# Patient Record
Sex: Female | Born: 1945 | Race: White | Hispanic: No | Smoking: Never smoker
Health system: Southern US, Community
[De-identification: ages and names within clinical notes are randomized; demographics above are authoritative.]

## PROBLEM LIST (undated history)

## (undated) DIAGNOSIS — L02219 Cutaneous abscess of trunk, unspecified: Secondary | ICD-10-CM

## (undated) DIAGNOSIS — J45909 Unspecified asthma, uncomplicated: Secondary | ICD-10-CM

## (undated) DIAGNOSIS — I10 Essential (primary) hypertension: Secondary | ICD-10-CM

## (undated) DIAGNOSIS — E119 Type 2 diabetes mellitus without complications: Secondary | ICD-10-CM

## (undated) DIAGNOSIS — E785 Hyperlipidemia, unspecified: Secondary | ICD-10-CM

## (undated) DIAGNOSIS — E669 Obesity, unspecified: Secondary | ICD-10-CM

## (undated) DIAGNOSIS — L03319 Cellulitis of trunk, unspecified: Secondary | ICD-10-CM

## (undated) HISTORY — DX: Type 2 diabetes mellitus without complications: E11.9

## (undated) HISTORY — DX: Hyperlipidemia, unspecified: E78.5

## (undated) HISTORY — DX: Obesity, unspecified: E66.9

## (undated) HISTORY — DX: Essential (primary) hypertension: I10

## (undated) HISTORY — DX: Cellulitis of trunk, unspecified: L03.319

## (undated) HISTORY — DX: Unspecified asthma, uncomplicated: J45.909

## (undated) HISTORY — DX: Cutaneous abscess of trunk, unspecified: L02.219

---
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
## 1983-11-23 HISTORY — PX: ABDOMINAL HYSTERECTOMY: SHX81

## 1987-11-23 HISTORY — PX: OTHER SURGICAL HISTORY: SHX169

## 2000-09-09 ENCOUNTER — Encounter: Payer: Self-pay | Admitting: Emergency Medicine

## 2000-09-09 ENCOUNTER — Emergency Department (HOSPITAL_COMMUNITY): Admission: EM | Admit: 2000-09-09 | Discharge: 2000-09-10 | Payer: Self-pay | Admitting: Emergency Medicine

## 2006-07-14 ENCOUNTER — Encounter: Admission: RE | Admit: 2006-07-14 | Discharge: 2006-07-14 | Payer: Self-pay | Admitting: Orthopedic Surgery

## 2006-09-16 ENCOUNTER — Inpatient Hospital Stay (HOSPITAL_COMMUNITY): Admission: RE | Admit: 2006-09-16 | Discharge: 2006-09-19 | Payer: Self-pay | Admitting: Orthopedic Surgery

## 2008-05-29 ENCOUNTER — Encounter: Payer: Self-pay | Admitting: Family Medicine

## 2008-09-27 ENCOUNTER — Emergency Department (HOSPITAL_BASED_OUTPATIENT_CLINIC_OR_DEPARTMENT_OTHER): Admission: EM | Admit: 2008-09-27 | Discharge: 2008-09-27 | Payer: Self-pay | Admitting: Emergency Medicine

## 2009-02-11 ENCOUNTER — Ambulatory Visit: Payer: Self-pay | Admitting: Family Medicine

## 2009-02-11 DIAGNOSIS — E1140 Type 2 diabetes mellitus with diabetic neuropathy, unspecified: Secondary | ICD-10-CM | POA: Insufficient documentation

## 2009-02-11 DIAGNOSIS — E119 Type 2 diabetes mellitus without complications: Secondary | ICD-10-CM

## 2009-02-11 DIAGNOSIS — E1165 Type 2 diabetes mellitus with hyperglycemia: Secondary | ICD-10-CM

## 2009-02-11 DIAGNOSIS — I10 Essential (primary) hypertension: Secondary | ICD-10-CM

## 2009-02-11 DIAGNOSIS — E669 Obesity, unspecified: Secondary | ICD-10-CM

## 2009-02-11 DIAGNOSIS — J45909 Unspecified asthma, uncomplicated: Secondary | ICD-10-CM

## 2009-02-11 DIAGNOSIS — E785 Hyperlipidemia, unspecified: Secondary | ICD-10-CM | POA: Insufficient documentation

## 2009-02-11 HISTORY — DX: Essential (primary) hypertension: I10

## 2009-02-11 HISTORY — DX: Unspecified asthma, uncomplicated: J45.909

## 2009-02-11 HISTORY — DX: Hyperlipidemia, unspecified: E78.5

## 2009-02-11 HISTORY — DX: Type 2 diabetes mellitus without complications: E11.9

## 2009-02-11 HISTORY — DX: Obesity, unspecified: E66.9

## 2009-02-12 LAB — CONVERTED CEMR LAB
ALT: 22 units/L (ref 0–35)
AST: 21 units/L (ref 0–37)
Bilirubin, Direct: 0.1 mg/dL (ref 0.0–0.3)
Chloride: 106 meq/L (ref 96–112)
Direct LDL: 120.6 mg/dL
GFR calc non Af Amer: 107.42 mL/min (ref >60)
Microalb Creat Ratio: 3.1 mg/g (ref 0.0–30.0)
Microalb, Ur: 0.2 mg/dL (ref 0.0–1.9)
Potassium: 3.7 meq/L (ref 3.5–5.1)
Sodium: 142 meq/L (ref 135–145)
Total Bilirubin: 0.7 mg/dL (ref 0.3–1.2)
Total CHOL/HDL Ratio: 8
VLDL: 157.4 mg/dL — ABNORMAL HIGH (ref 0.0–40.0)

## 2009-04-05 LAB — HM DIABETES EYE EXAM: HM Diabetic Eye Exam: NORMAL

## 2009-04-30 ENCOUNTER — Ambulatory Visit: Payer: Self-pay | Admitting: Family Medicine

## 2009-05-01 ENCOUNTER — Telehealth: Payer: Self-pay | Admitting: Family Medicine

## 2009-05-01 LAB — CONVERTED CEMR LAB
Cholesterol: 190 mg/dL (ref 0–200)
HDL: 34.8 mg/dL — ABNORMAL LOW (ref >39.00)
VLDL: 49.6 mg/dL — ABNORMAL HIGH (ref 0.0–40.0)

## 2009-12-29 ENCOUNTER — Ambulatory Visit: Payer: Self-pay | Admitting: Family Medicine

## 2009-12-29 DIAGNOSIS — L02219 Cutaneous abscess of trunk, unspecified: Secondary | ICD-10-CM

## 2009-12-29 DIAGNOSIS — L03319 Cellulitis of trunk, unspecified: Secondary | ICD-10-CM

## 2009-12-29 HISTORY — DX: Cutaneous abscess of trunk, unspecified: L02.219

## 2009-12-31 ENCOUNTER — Ambulatory Visit: Payer: Self-pay | Admitting: Family Medicine

## 2010-01-02 LAB — CONVERTED CEMR LAB
AST: 27 units/L (ref 0–37)
Albumin: 3.6 g/dL (ref 3.5–5.2)
Creatinine,U: 90.3 mg/dL
Direct LDL: 73.3 mg/dL
Hgb A1c MFr Bld: 8.3 % — ABNORMAL HIGH (ref 4.6–6.5)
Microalb Creat Ratio: 15.5 mg/g (ref 0.0–30.0)
Total Bilirubin: 0.6 mg/dL (ref 0.3–1.2)
Total CHOL/HDL Ratio: 6
Triglycerides: 794 mg/dL — ABNORMAL HIGH (ref 0.0–149.0)
VLDL: 158.8 mg/dL — ABNORMAL HIGH (ref 0.0–40.0)

## 2010-01-19 ENCOUNTER — Telehealth: Payer: Self-pay | Admitting: Family Medicine

## 2010-07-01 ENCOUNTER — Ambulatory Visit: Payer: Self-pay | Admitting: Family Medicine

## 2010-07-06 LAB — HM DIABETES FOOT EXAM

## 2010-07-09 LAB — CONVERTED CEMR LAB
ALT: 37 units/L — ABNORMAL HIGH (ref 0–35)
AST: 33 units/L (ref 0–37)
Alkaline Phosphatase: 49 units/L (ref 39–117)
Bilirubin, Direct: 0.1 mg/dL (ref 0.0–0.3)
Direct LDL: 80 mg/dL
HDL: 37.1 mg/dL — ABNORMAL LOW (ref >39.00)
Microalb, Ur: 2.7 mg/dL — ABNORMAL HIGH (ref 0.0–1.9)
Total Bilirubin: 0.5 mg/dL (ref 0.3–1.2)
Total Protein: 6.5 g/dL (ref 6.0–8.3)

## 2010-12-22 NOTE — Assessment & Plan Note (Signed)
Summary: MED CHECK/REFILLS/PT COMING IN FASTING/CJR   Vital Signs:  Patient profile:   65 year old female Weight:      234 pounds Temp:     98.3 degrees F oral Pulse rate:   78 / minute BP sitting:   120 / 80  (left arm) Cuff size:   large  Vitals Entered By: Romualdo Bolk, CMA (AAMA) (July 01, 2010 11:07 AM) CC: Pt is fasting and here for a follow up on her medication, Hypertension Management   History of Present Illness: Here for follow up diabetes and hypertension. Has lost some weight due to her efforts since last visit. Compliant with meds.  Diabetes Management History:      She has not been enrolled in the "Diabetic Education Program".  She states understanding of dietary principles but she is not following the appropriate diet.  No sensory loss is reported.  Self foot exams are being performed.  She is not checking home blood sugars.  She says that she is not exercising regularly.        Hypoglycemic symptoms are not occurring.  No hyperglycemic symptoms are reported.        The following changes have been made to her treatment plan since last visit: medication changes.    Hypertension History:      She denies headache, chest pain, palpitations, dyspnea with exertion, orthopnea, PND, peripheral edema, visual symptoms, neurologic problems, syncope, and side effects from treatment.  She notes no problems with any antihypertensive medication side effects.        Positive major cardiovascular risk factors include female age 44 years old or older, diabetes, hyperlipidemia, and hypertension.  Negative major cardiovascular risk factors include non-tobacco-user status.      Preventive Screening-Counseling & Management  Alcohol-Tobacco     Alcohol drinks/day: 0     Smoking Status: never  Caffeine-Diet-Exercise     Caffeine use/day: 2     Does Patient Exercise: no  Current Medications (verified): 1)  Metformin Hcl 500 Mg Tabs (Metformin Hcl) .... Two Tabs in Am, Two  Tabs in Pm 2)  Fish Oil  Oil (Fish Oil) .... Once Daily 3)  Fenofibrate 160 Mg Tabs (Fenofibrate) .... Once Daily 4)  Pravachol 40 Mg Tabs (Pravastatin Sodium) .... Once Daily  Allergies (verified): 1)  ! Codeine Sulfate (Codeine Sulfate) 2)  ! Lipitor (Atorvastatin Calcium)  Past History:  Past Medical History: Last updated: 02/11/2009 Asthma Hyperlipidemia Diabetes Hay Fever/Allergies Fx left shoulder (Dr Madelon Lips)  Past Surgical History: Last updated: 02/11/2009 Appendectomy 1957 Hysterectomy partial 1985  Family History: Last updated: 02/11/2009 Family History of Arthritis grandparent Family History of Cardiovascular disorder grandparent Diabetes, parent  Social History: Last updated: 02/11/2009 Retired Married Never Smoked Alcohol use-no  Risk Factors: Alcohol Use: 0 (07/01/2010) Caffeine Use: 2 (07/01/2010) Exercise: no (07/01/2010)  Risk Factors: Smoking Status: never (07/01/2010) PMH-FH-SH reviewed for relevance  Social History: Caffeine use/day:  2  Review of Systems  The patient denies anorexia, fever, chest pain, syncope, dyspnea on exertion, peripheral edema, prolonged cough, headaches, hemoptysis, abdominal pain, melena, hematochezia, muscle weakness, depression, and enlarged lymph nodes.    Physical Exam  General:  Well-developed,well-nourished,in no acute distress; alert,appropriate and cooperative throughout examination Head:  Normocephalic and atraumatic without obvious abnormalities. No apparent alopecia or balding. Eyes:  No corneal or conjunctival inflammation noted. EOMI. Perrla. Funduscopic exam benign, without hemorrhages, exudates or papilledema. Vision grossly normal. Mouth:  Oral mucosa and oropharynx without lesions or exudates.  Teeth in  good repair. Neck:  No deformities, masses, or tenderness noted. Lungs:  Normal respiratory effort, chest expands symmetrically. Lungs are clear to auscultation, no crackles or wheezes. Heart:   normal rate, regular rhythm, and no gallop.   Extremities:  No clubbing, cyanosis, edema, or deformity noted with normal full range of motion of all joints.    Diabetes Management Exam:    Foot Exam (with socks and/or shoes not present):       Sensory-Pinprick/Light touch:          Left medial foot (L-4): normal          Left dorsal foot (L-5): normal          Left lateral foot (S-1): normal          Right medial foot (L-4): normal          Right dorsal foot (L-5): normal          Right lateral foot (S-1): normal       Sensory-Monofilament:          Left foot: normal          Right foot: normal       Inspection:          Left foot: normal          Right foot: normal       Nails:          Left foot: normal          Right foot: normal    Eye Exam:       Eye Exam done elsewhere          Date: 03/22/2009          Results: normal          Done by: Dr. Joseph Art   Impression & Recommendations:  Problem # 1:  DIABETES-TYPE 2 (ICD-250.00) hx of poor control. Her updated medication list for this problem includes:    Metformin Hcl 500 Mg Tabs (Metformin hcl) .Marland Kitchen..Marland Kitchen Two tabs in am, two tabs in pm  Orders: Venipuncture (63875) Specimen Handling (64332) TLB-A1C / Hgb A1C (Glycohemoglobin) (83036-A1C) TLB-Microalbumin/Creat Ratio, Urine (82043-MALB)  Problem # 2:  HYPERLIPIDEMIA (ICD-272.4)  Her updated medication list for this problem includes:    Fenofibrate 160 Mg Tabs (Fenofibrate) ..... Once daily    Pravachol 40 Mg Tabs (Pravastatin sodium) ..... Once daily  Orders: Venipuncture (95188) Specimen Handling (41660) TLB-Lipid Panel (80061-LIPID) TLB-Hepatic/Liver Function Pnl (80076-HEPATIC)  Problem # 3:  OBESITY (ICD-278.00) needs to continue weight loss efforts.  Problem # 4:  ESSENTIAL HYPERTENSION (ICD-401.9)  Complete Medication List: 1)  Metformin Hcl 500 Mg Tabs (Metformin hcl) .... Two tabs in am, two tabs in pm 2)  Fish Oil Oil (Fish oil) .... Once daily 3)   Fenofibrate 160 Mg Tabs (Fenofibrate) .... Once daily 4)  Pravachol 40 Mg Tabs (Pravastatin sodium) .... Once daily  Hypertension Assessment/Plan:      The patient's hypertensive risk group is category C: Target organ damage and/or diabetes.  Today's blood pressure is 120/80.    Patient Instructions: 1)  Schedule eye exam. 2)  You need to lose weight. Consider a lower calorie diet and regular exercise.  3)  Check your blood sugars regularly. If your readings are usually above:  or below 70 you should contact our office.  4)  It is important that your diabetic A1c level is checked every 3 months.  5)  See your eye doctor yearly to check for  diabetic eye damage. 6)  Check your feet each night  for sore areas, calluses or signs of infection.  7)  Please schedule a follow-up appointment in 3 months .  Prescriptions: PRAVACHOL 40 MG TABS (PRAVASTATIN SODIUM) once daily  #90 x 3   Entered and Authorized by:   Evelena Peat MD   Signed by:   Evelena Peat MD on 07/01/2010   Method used:   Electronically to        [facility]* (retail)       [address] [place], [place]  [address]       Ph: [phone]       Fax: [phone]   RxID:   [id number] FENOFIBRATE 160 MG TABS (FENOFIBRATE) once daily  #90 x 3   Entered and Authorized by:   Evelena Peat MD   Signed by:   Evelena Peat MD on 07/01/2010   Method used:   Electronically to        [facility]* (retail)       [address] [place], [place]  [address]       Ph: [phone]       Fax: [phone]   RxID:   [id number] METFORMIN HCL 500 MG TABS (METFORMIN HCL) two tabs in AM, two tabs in PM  #360 x 3   Entered and Authorized by:   Evelena Peat MD   Signed by:   Evelena Peat MD on 07/01/2010   Method used:   Electronically to        [facility]* (retail)       [address] [place], [place]  [address]        Ph: [phone]       Fax: [phone]   RxID:   [id number]

## 2010-12-22 NOTE — Progress Notes (Signed)
Summary: REQ FOR MED, generic for Fenofibrate  Phone Note Call from Patient   Caller: Patient [phone] Reason for Call: Talk to Nurse, Talk to Doctor Summary of Call: Pt called to speak with nurse.... adv that she needs to have a RX for the generic for Fenofibrate sent to [facility]acy on [address].... Pt adv she doesn't have presription insurance and brand name is too expensive.  Pt can be reached at [phone] with any questions or concerns.  Initial call taken by: Debbra Riding,  January 19, 2010 11:04 AM  Follow-up for Phone Call        She should already be on generic (fenofibrate) per med list. Follow-up by: Evelena Peat MD,  January 19, 2010 2:47 PM  Additional Follow-up for Phone Call Additional follow up Details #1::        Pt informed on home VM, Rx sent to [facility] Additional Follow-up by: Sid Falcon LPN,  January 19, 2010 3:29 PM    Prescriptions: FENOFIBRATE 160 MG TABS (FENOFIBRATE) once daily  #90 x 3   Entered by:   Sid Falcon LPN   Authorized by:   Evelena Peat MD   Signed by:   Sid Falcon LPN on 59/56/3875   Method used:   Electronically to        [facility]* (retail)       [address] [place], [place]  [address]       Ph: [phone]       Fax: [phone]   RxID:   [id number]

## 2010-12-22 NOTE — Assessment & Plan Note (Signed)
Summary: 2 DAY ROV/NJR   Vital Signs:  Patient profile:   65 year old female Temp:     99.2 degrees F oral BP sitting:   120 / 78  (left arm) Cuff size:   large  Vitals Entered By: Sid Falcon LPN (December 31, 2009 9:44 AM) CC: 2 day follow-up abd abcess   History of Present Illness: Followup right lower abdominal abscess and type 2 diabetes. Also history of hyperlipidemia.  No pain from abdominal abscess which was drained 2 days ago. No fever or chills. Redness is resolving and less edema. Taking Keflex without side effects.  Patient taking pravastatin for hyperlipidemia but stopped fenofibrate on her own. Not due to side effect. Patient states she was told by pharmacist there may be interaction potential with Pravachol. History of hypercholesterolemia and hypertriglyceridemia. No history of CAD.  Diabetes Management History:      She has not been enrolled in the "Diabetic Education Program".  She states understanding of dietary principles but she is not following the appropriate diet.  No sensory loss is reported.  Self foot exams are being performed.  She is not checking home blood sugars.  She says that she is not exercising regularly.        Hypoglycemic symptoms are not occurring.  No hyperglycemic symptoms are reported.    Preventive Screening-Counseling & Management  Caffeine-Diet-Exercise     Does Patient Exercise: no  Allergies: 1)  ! Codeine Sulfate (Codeine Sulfate) 2)  ! Lipitor (Atorvastatin Calcium)  Past History:  Past Medical History: Last updated: 02/11/2009 Asthma Hyperlipidemia Diabetes Hay Fever/Allergies Fx left shoulder (Dr Madelon Lips)  Past Surgical History: Last updated: 02/11/2009 Appendectomy 1957 Hysterectomy partial 1985  Family History: Last updated: 02/11/2009 Family History of Arthritis grandparent Family History of Cardiovascular disorder grandparent Diabetes, parent  Social History: Last updated:  02/11/2009 Retired Married Never Smoked Alcohol use-no PMH-FH-SH reviewed for relevance  Social History: Does Patient Exercise:  no  Review of Systems  The patient denies fever, weight loss, chest pain, syncope, dyspnea on exertion, peripheral edema, headaches, abdominal pain, melena, and hematochezia.    Physical Exam  General:  Well-developed,well-nourished,in no acute distress; alert,appropriate and cooperative throughout examination Ears:  External ear exam shows no significant lesions or deformities.  Otoscopic examination reveals clear canals, tympanic membranes are intact bilaterally without bulging, retraction, inflammation or discharge. Hearing is grossly normal bilaterally. Mouth:  Oral mucosa and oropharynx without lesions or exudates.  Teeth in good repair. Neck:  No deformities, masses, or tenderness noted. Lungs:  Normal respiratory effort, chest expands symmetrically. Lungs are clear to auscultation, no crackles or wheezes. Heart:  normal rate, regular rhythm, and no gallop.   Abdomen:  right lower abdominal wound is examined. Less erythema and nontender. Packing is out. No purulent drainage. No fluctuance.  Diabetes Management Exam:    Foot Exam (with socks and/or shoes not present):       Sensory-Pinprick/Light touch:          Left medial foot (L-4): normal          Left dorsal foot (L-5): normal          Left lateral foot (S-1): normal          Right medial foot (L-4): normal          Right dorsal foot (L-5): normal          Right lateral foot (S-1): normal       Sensory-Monofilament:  Left foot: normal          Right foot: normal       Inspection:          Left foot: normal          Right foot: normal       Nails:          Left foot: normal          Right foot: normal    Eye Exam:       Eye Exam done elsewhere          Date: 04/22/2009          Results: normal          Done by: Dr Lucretia Roers   Impression & Recommendations:  Problem # 1:   ABDOMINAL ABSCESS (ICD-682.2) Assessment Improved finish out antibiotic and followup if this is not fully healing over the next week Her updated medication list for this problem includes:    Cephalexin 500 Mg Caps (Cephalexin) ..... One by mouth three times a day for 10 days  Problem # 2:  DIABETES-TYPE 2 (ICD-250.00) poor compliance. Her updated medication list for this problem includes:    Metformin Hcl 500 Mg Tabs (Metformin hcl) ..... One tab in am, one tab in pm  Orders: TLB-A1C / Hgb A1C (Glycohemoglobin) (83036-A1C) TLB-Microalbumin/Creat Ratio, Urine (82043-MALB)  Problem # 3:  HYPERLIPIDEMIA (ICD-272.4) reasses lipids.  Hx very high triglycerides in past. Her updated medication list for this problem includes:    Fenofibrate 160 Mg Tabs (Fenofibrate) ..... Once daily    Pravachol 40 Mg Tabs (Pravastatin sodium) ..... Once daily  Orders: TLB-Lipid Panel (80061-LIPID) TLB-Hepatic/Liver Function Pnl (80076-HEPATIC)  Complete Medication List: 1)  Metformin Hcl 500 Mg Tabs (Metformin hcl) .... One tab in am, one tab in pm 2)  Fish Oil Oil (Fish oil) .... Once daily 3)  Fenofibrate 160 Mg Tabs (Fenofibrate) .... Once daily 4)  Pravachol 40 Mg Tabs (Pravastatin sodium) .... Once daily 5)  Cephalexin 500 Mg Caps (Cephalexin) .... One by mouth three times a day for 10 days  Patient Instructions: 1)  It is important that you exercise reguarly at least 20 minutes 5 times a week. If you develop chest pain, have severe difficulty breathing, or feel very tired, stop exercising immediately and seek medical attention.  2)  You need to lose weight. Consider a lower calorie diet and regular exercise.  3)  Check your blood sugars regularly. If your readings are usually above:140 or below 70 you should contact our office.  4)  It is important that your diabetic A1c level is checked every 3 months.  5)  See your eye doctor yearly to check for diabetic eye damage. 6)  Check your feet each  night  for sore areas, calluses or signs of infection.

## 2010-12-22 NOTE — Assessment & Plan Note (Signed)
Summary: SORE ON STOMACH (INFECTED?) / DIABETIC CONCERNS W/ SAME // RS   Vital Signs:  Patient profile:   65 year old female Weight:      248 pounds BMI:     47.03 Temp:     99.2 degrees F oral BP sitting:   120 / 80  (left arm) Cuff size:   large  Vitals Entered By: Sid Falcon LPN (December 29, 2009 2:57 PM) CC: sore on right lower abd Is Patient Diabetic? Yes Did you bring your meter with you today? No   History of Present Illness: Patient is type II diabetic with history of noncompliance seen with one-day history redness and swelling right lower abdomen. She just noticed this in shower earlier today. No associated pain. No fever or chills. Blood sugars been relatively stable with fastings 125.  Allergies: 1)  ! Codeine Sulfate (Codeine Sulfate) 2)  ! Lipitor (Atorvastatin Calcium)  Past History:  Past Medical History: Last updated: 02/11/2009 Asthma Hyperlipidemia Diabetes Hay Fever/Allergies Fx left shoulder (Dr Madelon Lips) PMH-FH-SH reviewed for relevance  Review of Systems      See HPI  Physical Exam  General:  Well-developed,well-nourished,in no acute distress; alert,appropriate and cooperative throughout examination Abdomen:  right lower abdominal wall reveals area of erythema approximately 4 x 5 cm and near center she has area of swelling and fluctuance approximately 1/2 cm diameter. No visible purulent drainage   Impression & Recommendations:  Problem # 1:  ABDOMINAL ABSCESS (ICD-682.2)  recommended I&D after reviewing risks and benefits with patient patient consented.  Prepped skin with betadine.  Anesth with 1%plain xylo.  Linear incision 1cm over area of fluctuance with purulence released.  Probed abscess with hemostat and then packed wound cavity with iodiform gauze.  Outer dressing applied.  Reassess 2 days.  Her updated medication list for this problem includes:    Cephalexin 500 Mg Caps (Cephalexin) ..... One by mouth three times a day for 10  days  Orders: I&D Abscess, Complex (10061)  Complete Medication List: 1)  Metformin Hcl 500 Mg Tabs (Metformin hcl) .... One tab in am, one tab in pm 2)  Fish Oil Oil (Fish oil) .... Once daily 3)  Fenofibrate 160 Mg Tabs (Fenofibrate) .... Once daily 4)  Pravachol 40 Mg Tabs (Pravastatin sodium) .... Once daily 5)  Cephalexin 500 Mg Caps (Cephalexin) .... One by mouth three times a day for 10 days  Patient Instructions: 1)  Schedule followup appointment in 2 days to reassess 2)  Followup sooner if you develop any fever or worsening symptoms Prescriptions: CEPHALEXIN 500 MG CAPS (CEPHALEXIN) one by mouth three times a day for 10 days  #30 x 0   Entered by:   Sid Falcon LPN   Authorized by:   Evelena Peat MD   Signed by:   Sid Falcon LPN on 09/32/3557   Method used:   Electronically to        [facility]* (retail)       [address] [place], [place]  [address]       Ph: [phone]       Fax: [phone]   RxID:   [id number] CEPHALEXIN 500 MG CAPS (CEPHALEXIN) one by mouth three times a day for 10 days  #30 x 0   Entered and Authorized by:   Evelena Peat MD   Signed by:   Evelena Peat MD on 12/29/2009  Method used:   Electronically to        [facility]* (retail)       [address]       [place], [place]  [id number]       Ph: [phone]       Fax: [phone]   RxID:   [id number]

## 2011-04-09 NOTE — Discharge Summary (Signed)
NAME:  Mary Caldwell, Mary Caldwell NO.:  [id number]   MEDICAL RECORD NO.:  [id number]          PATIENT TYPE:  INP   LOCATION:  5004                         FACILITY:  [facility]   PHYSICIAN:  Dyke Brackett, M.D.    DATE OF BIRTH:  [birth year]   DATE OF ADMISSION:  09/16/2006  DATE OF DISCHARGE:  09/19/2006                               DISCHARGE SUMMARY   ADMISSION DIAGNOSES:  1. End-stage osteoarthritis, left hip.  2. Type 2 diabetes mellitus.  3. Hypercholesterolemia.  4. Obesity.   DISCHARGE DIAGNOSES:  1. End-stage osteoarthritis, left hip, status post left total hip      arthroplasty.  2. Probable preoperative urinary tract infection with Escherichia coli      sensitive to all antibiotics.  3. Acute blood loss anemia secondary to surgery.  4. Mild hypokalemia.  5. Type 2 diabetes mellitus.  6. Hypercholesterolemia.  7. Obesity.   SURGICAL PROCEDURES:  On September 16, 2006, Mary Caldwell underwent a left  total hip arthroplasty by Dr. Marcie Mowers assisted by Rexene Edison,  PA-C.  He had a DePuy ASR unifemoral implant to size 47 placed with the  AML large stature 160 mm length, 45 mm offset size 13.5 femoral stem.  A  DePuy ASR acetabular cup ,size 54 __________ HA coated and a DePuy ASR  taper sleeve adapter 12/14 taper +5 neck length.   COMPLICATIONS:  None.   CONSULTANT:  Physical therapy and occupational therapy consult September 17, 2006.   HISTORY OF PRESENT ILLNESS:  This 65 year old white female patient  presented to Mary Caldwell with a history of a severe left femur fracture  treated with an IM nail.  She has had significant pain in the left hip  and groin since that was removed and 1992.  She has had x-rays that  showed some severe asymmetric degenerative joint disease in that left  hip with some focal osteochondral lesion of the left femoral head and  has tried multiple conservative measures which have all failed.  Because  of that and her continued pain,  she is presenting for a left hip  replacement.   HOSPITAL COURSE:  Mary Caldwell tolerated her surgical procedure well  without immediate postoperative complications.  She was transferred to  5000.  On postop day #1, T max 99.8, vital signs were stable.  She did  have some problems with nausea.  PCA was discontinued. She was started  on p.o. pain medications and did better with that.  She started on  therapy per protocol.   On postop day #2, T-max was 101.6, heart rate 112, respirations 22.  Hemoglobin was 8 with hematocrit of 22.  She was subsequently transfused  with 1 unit of packed red blood cells.  The left hip incision was benign  with moderate serous drainage.  Leg was neurovascularly intact.  Due to  her preop C&S which grew out 100,000 colonies per milliliter of E-coli,  a repeat UA and C&S were ordered and were done that day.  Repeat UA was  negative nitrite and leukocyte esterase, 0-2 red cells,  negative  protein, trace hemoglobin.  She was continued on therapy per protocol.   On postop day #3, she is doing well.  She wishes to go home today.  Pain  is well-controlled with p.o. medications. Nausea has resolved.  She is  having bowel movements without difficulty and voiding without  difficulty.  Repeat C&S has not been finalized yet.  Potassium is a  little bit low at 3.4, and that will be supplemented with oral potassium  today.  It is felt she is ready for discharge home,  and she will be  discharged home later today.   DISCHARGE INSTRUCTIONS:  Diet:  She is to resume her regular pre-  hospitalization diabetic diet.   MEDICATIONS:  She may resume her pre-hospitalization medications except  no Vicodin while on the Percocet.  Home medications included  1. Avandia 4 mg p.o. before breakfast and supper.  2. Metformin 500 mg p.o. before breakfast and supper.  3. TriCor 145 mg 1 tablet p.o. nightly.  4. Crestor 10 mg p.o. q.a.m..  5. Vicodin 1-2 tablets p.o. q.4-6 h p.r.n. for  pain which she will not      use while she is on.  Percocet.  Additional medications at this time include:  1. Lovenox 40 mg subcutaneously 8 a.m. with a last dose November 9, 11      with no refill.  2. Percocet 5/325 mg 1-2 tablets p.o. q.4 h p.r.n. for pain, 60, with      no refill.  3. Skelaxin 800 mg 1 tablet p.o. q.6 h p.r.n. for spasms, 40, with no      refill.  4. Keflex 500 mg 1 tablet p.o. q.i.d. for 7 days, 28, with no refill.  5. Suggested she take one iron tablet twice a day for 1 month.   ACTIVITY:  She can be out of bed weightbearing as tolerated on the left  leg with use a walker.  She is to have home health PT per [facility].  Please see the blue total hip discharge sheet for further  activity instructions.   WOUND CARE:  She may shower after no drainage from the wound for 2 days.  Please see the blue total hip discharge sheet for further wound care  instructions.   FOLLOWUP:  She needs to follow up with Mary Caldwell in our office on  Monday, November 5, and needs to call [phone] for that appointment.   LABORATORY DATA:  X-ray taken of her left hip on October 26 after  surgery showed the left hip replacement in good position with femoral  component well directed into the acetabular cup and no complications  noted.   Hemoglobin and hematocrit ranged from 12.7 and 36.2, respectively, on  October 22 to 8.0 and 22, respectively, on October 28 to 9.2 and 25.4,  respectively, on October 29.  White count is ranged from 6.2 on October  22 to 8.2 on October 29.  Platelets have remained within normal limits.   Glucose ranged from 85 on October 22 to a high of 194 on October 29.  Calcium ranged from 9.8 on October 22, to 8.3 on October 28, to 8.4 on  October 29.   UA on October 22 was positive nitrate, trace leukocyte esterase, few epithelials, 0-2 white cells, no red cells, many bacteria and did grow  out greater than 100,000 colonies per milliliter of  E-coli which was  sensitive to ampicillin,  ceftriaxone, cephazolin, Cipro, gentamicin, levofloxacin,  nitrofurantoin, tobramycin and trimethoprim sulfa.  All other laboratory  studies were within normal limits.  The final UA and C&S done on October  28. UA results as dictated in the Hospital Course. CNS results were not  available at this time.      Legrand Pitts Duffy, Arnetha Courser, M.D.  Electronically Signed    KED/MEDQ  D:  09/19/2006  T:  09/19/2006  Job:  [id number]   cc:   Evelena Peat, M.D.

## 2011-04-09 NOTE — Op Note (Signed)
NAME:  Mary Caldwell, FRIEND NO.:  [id number]   MEDICAL RECORD NO.:  [id number]          PATIENT TYPE:  INP   LOCATION:  5004                         FACILITY:  [facility]   PHYSICIAN:  Dyke Brackett, M.D.    DATE OF BIRTH:  04/02/1946   DATE OF PROCEDURE:  09/16/2006  DATE OF DISCHARGE:                               OPERATIVE REPORT   PREOPERATIVE DIAGNOSIS:  1. Severe osteoarthritis, left hip.  2. Morbid obesity.   POSTOPERATIVE DIAGNOSIS:  Severe osteoarthritis, left hip.   OPERATION:  Left total hip replacement (DePuy ASR metal-on-metal implant  with 13.5 mm large stature stem +5 mm neck length, 47 mm ball with 54 mm  cup).   SURGEON:  Dyke Brackett, M.D.   ASSISTANT:  __________   ESTIMATED BLOOD LOSS:  Approximately 400.   DESCRIPTION OF PROCEDURE:  With total prep and drape, the patient had to  have a rather extensive exposure given her morbid obesity.  She is  probably approximately 2-3 standard deviations above her normal weight.  She is exposed to extended posterior approach to the hip with exposure  of the iliotibial band.  This was technically more difficult secondary  to the patient's size; however, the iliotibial band was split with  splitting up the gluteus maximus fascia and muscle.  Short external  rotators were split with the T-capsulotomy made in the hip.  A wing  retractor was placed into the acetabulum for exposure.  A sharp Kocher  was placed anteriorly and a Medtronic.  Acetabulum was  exposed.  Prior to this the femur was cut about one fingerbreadth above  the lesser trochanter with revision of cut being made to allow placement  of several different neck lengths during the trial.  Broaching was  accomplished up to a 13.5 mm standard sound without difficulty.  Acetabulum was exposed and reamed up to a 53 size acetabulum to accept a  54 cup.  The 52-cup bottomed out nicely as a trial, but a 54-cup did  not.  The ASR cup size  54 was next implanted with about 15-20 degrees of  anteversion and about 45 degrees of abduction, seated nicely, confirmed  on an intraoperative x-ray to be seated well with the broach in place,  which appeared to fill the canal well and the femur as well.  Final  prosthesis was inserted and then again a trial neck at the +5 mm neck  length deemed to be extremely stable with good restoration of the leg  lengths.  Prior to insertion of the final components, the wound was  copiously irrigated.  T-capsulotomy was closed with interrupted  Ethibond.  The fascia lata with a  running Ethibond, the deep subcu with multiple 0 Vicryl with a 2-0  Vicryl in the subcutaneous tissues, the skin with a stapling device,  Marcaine with epinephrine placed into the skin subcutaneous tissues.  Compressive dressing applied.  Taken to the recovery room in stable  condition.      Dyke Brackett, M.D.  Electronically Signed     WDC/MEDQ  D:  09/16/2006  T:  09/17/2006  Job:  [id number]

## 2011-04-09 NOTE — Op Note (Signed)
NAME:  Mary Caldwell, Mary Caldwell NO.:  [id number]   MEDICAL RECORD NO.:  [id number]          PATIENT TYPE:  INP   LOCATION:  5004                         FACILITY:  [facility]   PHYSICIAN:  Dyke Brackett, M.D.    DATE OF BIRTH:  03/05/1946   DATE OF PROCEDURE:  09/16/2006  DATE OF DISCHARGE:  09/19/2006                               OPERATIVE REPORT   PREOPERATIVE DIAGNOSIS:  Left hip osteoarthritis.   POSTOPERATIVE DIAGNOSIS:  Left hip osteoarthritis.   PROCEDURE PERFORMED:  Left total hip replacement (DePuy ASR metal on  metal components with 13.5 large stature AML porous-coated stem with +5  mm neck length and size 54 acetabulum with a size 47 head).   SURGEON:  Dyke Brackett, M.D.   ASSISTANT SURGEON:  Arlys John D. Petrarca, P.A.-C.   DESCRIPTION OF PROCEDURE:  Lateral positioning with posterior approach  to the hip made.  On the left, splinting of the iliotibial band was  done.  The knee was flexed, to take the tension off the sciatic nerve.  Short external rotators were split with a T capsulotomy made with  identification of the diseased femoral head, cutting about one  fingerbreadth above the lesser trochanter, followed by progressive  reaming and then rasping to accept the appropriate size stem.  The  acetabulum was exposed with retractors anteriorly and inferiorly.  Moderately severe degenerative change was encountered, progressively  reamed up to accept a 1 mm under ring on the acetabulum.  Trial  reduction was carried out.  Based on the patient's relatively young age  and size, it was elected to do a metal-on-metal prosthesis.  The ASR cup  was inserted without difficulty, confirmed to be in good position with  about 45 degrees of abduction, 15 degrees of anteversion.  Again, trial  reduction was carried out and it seemed the most appropriate leg length  for restoration was at a +5 mm neck length.  The femoral stem was next  inserted with a good press-fit obtained.   No complication was noted.  The hip was extremely stable and could not really be dislocated on the  table.  The capsule was reapproximated with Ethibond; likewise, the  iliotibial band which was closed with 2-0 Vicryl, and the skin with skin  clips.  Marcaine with epinephrine to the skin, a light compressive  sterile dressing and knee immobilizer applied, and the patient was taken  to the recovery room in stable condition.      Dyke Brackett, M.D.  Electronically Signed     WDC/MEDQ  D:  11/09/2006  T:  11/10/2006  Job:  [id number]

## 2011-10-07 ENCOUNTER — Encounter: Payer: Self-pay | Admitting: *Deleted

## 2011-10-07 ENCOUNTER — Encounter: Payer: Self-pay | Admitting: Family Medicine

## 2011-10-13 ENCOUNTER — Ambulatory Visit (INDEPENDENT_AMBULATORY_CARE_PROVIDER_SITE_OTHER): Payer: Medicare Other | Admitting: Family Medicine

## 2011-10-13 ENCOUNTER — Encounter: Payer: Self-pay | Admitting: Family Medicine

## 2011-10-13 VITALS — BP 120/70 | Temp 98.3°F | Ht 60.0 in | Wt 212.0 lb

## 2011-10-13 DIAGNOSIS — E785 Hyperlipidemia, unspecified: Secondary | ICD-10-CM

## 2011-10-13 DIAGNOSIS — E669 Obesity, unspecified: Secondary | ICD-10-CM

## 2011-10-13 DIAGNOSIS — I10 Essential (primary) hypertension: Secondary | ICD-10-CM

## 2011-10-13 DIAGNOSIS — Z23 Encounter for immunization: Secondary | ICD-10-CM

## 2011-10-13 DIAGNOSIS — E119 Type 2 diabetes mellitus without complications: Secondary | ICD-10-CM

## 2011-10-13 LAB — HEMOGLOBIN A1C: Hgb A1c MFr Bld: 10.4 % — ABNORMAL HIGH (ref 4.6–6.5)

## 2011-10-13 LAB — LIPID PANEL
Cholesterol: 279 mg/dL — ABNORMAL HIGH (ref 0–200)
HDL: 40.7 mg/dL (ref >39.00)
Triglycerides: 935 mg/dL — ABNORMAL HIGH (ref 0.0–149.0)

## 2011-10-13 LAB — LDL CHOLESTEROL, DIRECT: Direct LDL: 76.5 mg/dL

## 2011-10-13 LAB — HEPATIC FUNCTION PANEL
ALT: 45 U/L — ABNORMAL HIGH (ref 0–35)
AST: 43 U/L — ABNORMAL HIGH (ref 0–37)
Albumin: 4 g/dL (ref 3.5–5.2)
Total Bilirubin: 0.7 mg/dL (ref 0.3–1.2)
Total Protein: 7.2 g/dL (ref 6.0–8.3)

## 2011-10-13 MED ORDER — METFORMIN HCL 500 MG PO TABS: 500.0000 mg | ORAL_TABLET | Freq: Two times a day (BID) | ORAL | Status: DC

## 2011-10-13 MED ORDER — PRAVASTATIN SODIUM 40 MG PO TABS: 40.0000 mg | ORAL_TABLET | Freq: Every day | ORAL | Status: DC

## 2011-10-13 MED ORDER — FENOFIBRATE 160 MG PO TABS: 160.0000 mg | ORAL_TABLET | Freq: Every day | ORAL | Status: DC

## 2011-10-13 NOTE — Progress Notes (Signed)
  Subjective:    Patient ID: Mary Caldwell, female    DOB: 1946/07/23, 65 y.o.   MRN: [id number]  HPI  Medical followup. History of very poor compliance. She has lost some significant weight since last visit and hopes to lose more. She has history of severe dyslipidemia as well as type 2 diabetes and morbid obesity. Not monitoring blood sugars. Supposed to be on Amaryl but has not been taking for some reason.  She is compliant with metformin. She remains on pravastatin for hyperlipidemia. Previous intolerance to Lipitor. Also takes fenofibrate. No myalgias. No symptoms of hyperglycemia.  Past Medical History  Diagnosis Date  . DIABETES-TYPE 2 02/11/2009  . HYPERLIPIDEMIA 02/11/2009  . OBESITY 02/11/2009  . ESSENTIAL HYPERTENSION 02/11/2009  . ASTHMA 02/11/2009  . ABDOMINAL ABSCESS 12/29/2009   Past Surgical History  Procedure Date  . Appendectomy 1957  . Abdominal hysterectomy 1985    reports that she has never smoked. She does not have any smokeless tobacco history on file. Her alcohol and drug histories not on file. family history includes Arthritis in her other; Diabetes in her other; and Heart disease in her other. Allergies  Allergen Reactions  . Atorvastatin     Does not remember  . Codeine Sulfate     GI upset     Review of Systems  Constitutional: Negative for fatigue.  Eyes: Negative for visual disturbance.  Respiratory: Negative for cough, chest tightness, shortness of breath and wheezing.   Cardiovascular: Negative for chest pain, palpitations and leg swelling.  Neurological: Negative for dizziness, seizures, syncope, weakness, light-headedness and headaches.       Objective:   Physical Exam  Constitutional: She appears well-developed and well-nourished.  HENT:  Mouth/Throat: Oropharynx is clear and moist.  Neck: Neck supple. No thyromegaly present.  Cardiovascular: Normal rate, regular rhythm and normal heart sounds.   Pulmonary/Chest: Effort normal and breath  sounds normal. No respiratory distress. She has no wheezes. She has no rales.  Musculoskeletal: She exhibits no edema.       Feet reveal no skin lesions. Good distal foot pulses. Good capillary refill. No calluses. Normal sensation with monofilament testing   Lymphadenopathy:    She has no cervical adenopathy.  Psychiatric: She has a normal mood and affect. Her behavior is normal.          Assessment & Plan:  #1 type 2 diabetes. History of suboptimal control. Recheck A1c. Hopefully improved with weight loss. #2 dyslipidemia. Check lipid and hepatic panel. Consider change to Crestor or if lipids poorly controlled

## 2011-10-15 NOTE — Progress Notes (Signed)
Quick Note:  Left a message for pt to return call. ______ 

## 2011-10-20 ENCOUNTER — Telehealth: Payer: Self-pay

## 2011-10-20 DIAGNOSIS — E785 Hyperlipidemia, unspecified: Secondary | ICD-10-CM

## 2011-10-20 MED ORDER — GLIMEPIRIDE 4 MG PO TABS: 4.0000 mg | ORAL_TABLET | ORAL | Status: DC

## 2011-10-20 MED ORDER — ROSUVASTATIN CALCIUM 20 MG PO TABS: 20.0000 mg | ORAL_TABLET | Freq: Every day | ORAL | Status: DC

## 2011-10-20 NOTE — Telephone Encounter (Signed)
Pt aware of labs (see results note) , samples of crestor up front for pick up - per dr. Caryl Never - ok to repeat labs 1 wk before rov  meds and labs ordered

## 2011-10-20 NOTE — Progress Notes (Signed)
Quick Note:  Spoke with pt - informed of dr. burchette's instructions - new rx's sent to [facility] , keep appt 01/13/12 - do labs 1 week before (fasting). Samples of crestor up front for pick up. ______

## 2012-01-06 ENCOUNTER — Other Ambulatory Visit: Payer: Medicare Other

## 2012-01-13 ENCOUNTER — Ambulatory Visit: Payer: Medicare Other | Admitting: Family Medicine

## 2012-06-13 ENCOUNTER — Encounter: Payer: Self-pay | Admitting: Family Medicine

## 2012-06-13 ENCOUNTER — Ambulatory Visit (INDEPENDENT_AMBULATORY_CARE_PROVIDER_SITE_OTHER): Payer: Medicare Other | Admitting: Family Medicine

## 2012-06-13 VITALS — BP 136/72 | Temp 99.0°F | Wt 219.0 lb

## 2012-06-13 DIAGNOSIS — I10 Essential (primary) hypertension: Secondary | ICD-10-CM

## 2012-06-13 DIAGNOSIS — E785 Hyperlipidemia, unspecified: Secondary | ICD-10-CM

## 2012-06-13 DIAGNOSIS — E669 Obesity, unspecified: Secondary | ICD-10-CM

## 2012-06-13 DIAGNOSIS — E119 Type 2 diabetes mellitus without complications: Secondary | ICD-10-CM

## 2012-06-13 LAB — HEPATIC FUNCTION PANEL
ALT: 49 U/L — ABNORMAL HIGH (ref 0–35)
Alkaline Phosphatase: 44 U/L (ref 39–117)
Bilirubin, Direct: 0.1 mg/dL (ref 0.0–0.3)
Total Bilirubin: 0.8 mg/dL (ref 0.3–1.2)
Total Protein: 7.2 g/dL (ref 6.0–8.3)

## 2012-06-13 LAB — LIPID PANEL: HDL: 41.9 mg/dL (ref >39.00)

## 2012-06-13 LAB — LDL CHOLESTEROL, DIRECT: Direct LDL: 47.7 mg/dL

## 2012-06-13 LAB — MICROALBUMIN / CREATININE URINE RATIO
Creatinine,U: 121.6 mg/dL
Microalb Creat Ratio: 4.9 mg/g (ref 0.0–30.0)

## 2012-06-13 MED ORDER — GLIMEPIRIDE 4 MG PO TABS: 4.0000 mg | ORAL_TABLET | ORAL | Status: DC

## 2012-06-13 MED ORDER — METFORMIN HCL 500 MG PO TABS: 500.0000 mg | ORAL_TABLET | Freq: Two times a day (BID) | ORAL | Status: DC

## 2012-06-13 MED ORDER — ROSUVASTATIN CALCIUM 20 MG PO TABS: 20.0000 mg | ORAL_TABLET | Freq: Every day | ORAL | Status: DC

## 2012-06-13 MED ORDER — FENOFIBRATE 160 MG PO TABS: 160.0000 mg | ORAL_TABLET | Freq: Every day | ORAL | Status: DC

## 2012-06-13 NOTE — Patient Instructions (Addendum)
Set up eye exam soon Lose some weight!  This will help your glucose, blood pressure, and lipid control.

## 2012-06-13 NOTE — Progress Notes (Signed)
  Subjective:    Patient ID: Mary Caldwell, female    DOB: 1945/12/30, 66 y.o.   MRN: [id number]  HPI  Medical followup. History of very poor compliance. Obesity, type 2 diabetes, dyslipidemia, marginal blood pressure. She's actually gained 7 more pounds since last visit. No regular exercise. Inconsistently checking blood sugars. We initiated glimepiride last visit but she ran out about a week or 2 ago. Blood pressures had been improving since starting taking that. Last A1c 10.4%. No recent eye exam. No neuropathy symptoms. Has normal kidney function. She has very severe hyperlipidemia and we changed from pravastatin to Crestor. She states she's been compliant with taking that. No chest pains. No blurred vision.  Past Medical History  Diagnosis Date  . DIABETES-TYPE 2 02/11/2009  . HYPERLIPIDEMIA 02/11/2009  . OBESITY 02/11/2009  . ESSENTIAL HYPERTENSION 02/11/2009  . ASTHMA 02/11/2009  . ABDOMINAL ABSCESS 12/29/2009   Past Surgical History  Procedure Date  . Appendectomy 1957  . Abdominal hysterectomy 1985    reports that she has never smoked. She does not have any smokeless tobacco history on file. Her alcohol and drug histories not on file. family history includes Arthritis in her other; Diabetes in her other; and Heart disease in her other. Allergies  Allergen Reactions  . Atorvastatin     Does not remember  . Codeine Sulfate     GI upset      Review of Systems  Constitutional: Negative for fatigue.  Eyes: Negative for visual disturbance.  Respiratory: Negative for cough, chest tightness, shortness of breath and wheezing.   Cardiovascular: Negative for chest pain, palpitations and leg swelling.  Neurological: Negative for dizziness, seizures, syncope, weakness, light-headedness and headaches.       Objective:   Physical Exam  Constitutional: She is oriented to person, place, and time.       Morbidly obese pleasant in no distress  HENT:  Mouth/Throat: Oropharynx is clear and  moist.  Neck: Neck supple. No thyromegaly present.  Cardiovascular: Normal rate and regular rhythm.   Pulmonary/Chest: Breath sounds normal. No respiratory distress. She has no wheezes. She has no rales.  Musculoskeletal:       Feet reveal no skin lesions. Good distal foot pulses. Good capillary refill. No calluses. Normal sensation with monofilament testing   Neurological: She is alert and oriented to person, place, and time.          Assessment & Plan:  #1 type 2 diabetes. History of poor control. Recheck A1c. Medications refilled. Check urine microalbumin. Set up eye exam.  #2 borderline elevated blood pressure. We suggested ACE inhibitor. Patient very reluctant. She prefers to work on weight loss first. Check urine microalbumin and if positive would start ACE inhibitor. Recommended ACE inhibitor now and pt wishes to try weight loss and in 3 months if blood pressure  still > 130/80 initiate ACE inhibitor  #3 hyperlipidemia. Recheck lipid and hepatic panel

## 2012-06-14 NOTE — Progress Notes (Signed)
Quick Note:  Pt informed, copy mailed to pt home ______ 

## 2012-06-17 ENCOUNTER — Other Ambulatory Visit: Payer: Self-pay | Admitting: Family Medicine

## 2012-09-11 ENCOUNTER — Ambulatory Visit (INDEPENDENT_AMBULATORY_CARE_PROVIDER_SITE_OTHER): Payer: Medicare Other | Admitting: Family Medicine

## 2012-09-11 ENCOUNTER — Encounter: Payer: Self-pay | Admitting: Family Medicine

## 2012-09-11 VITALS — BP 120/70 | Temp 98.2°F | Wt 215.0 lb

## 2012-09-11 DIAGNOSIS — E119 Type 2 diabetes mellitus without complications: Secondary | ICD-10-CM

## 2012-09-11 DIAGNOSIS — R197 Diarrhea, unspecified: Secondary | ICD-10-CM

## 2012-09-11 DIAGNOSIS — R300 Dysuria: Secondary | ICD-10-CM

## 2012-09-11 LAB — POCT URINALYSIS DIPSTICK
Bilirubin, UA: NEGATIVE
Ketones, UA: NEGATIVE
Leukocytes, UA: NEGATIVE
Nitrite, UA: NEGATIVE
Protein, UA: NEGATIVE
pH, UA: 6

## 2012-09-11 NOTE — Progress Notes (Signed)
  Subjective:    Patient ID: Mary Caldwell, female    DOB: [birth year], 66 y.o.   MRN: [id number]  HPI  Patient seen as acute work in with the following issues  Onset last Wednesday of some urine urgency and burning. Slightly better past couple days. No fever. No chills. Patient also relates intermittent diarrhea off and on for couple weeks. Mostly loose stools. No bloody diarrhea. No localizing abdominal pain. Denies any fever. No nausea or vomiting. No sick contacts.  She has history of obesity, dyslipidemia, type 2 diabetes, and hypertension. History of very poor compliance. Diabetes been poorly controlled. She does take metformin is not clear if she's taking this regularly. She has never had diarrhea previously with metformin. She's not had previous colonoscopy and we brought this up for discussion today and she's not willing at this time to consider. No recent weight changes. No appetite changes.  Past Medical History  Diagnosis Date  . DIABETES-TYPE 2 02/11/2009  . HYPERLIPIDEMIA 02/11/2009  . OBESITY 02/11/2009  . ESSENTIAL HYPERTENSION 02/11/2009  . ASTHMA 02/11/2009  . ABDOMINAL ABSCESS 12/29/2009   Past Surgical History  Procedure Date  . Appendectomy 1957  . Abdominal hysterectomy 1985    reports that she has never smoked. She does not have any smokeless tobacco history on file. Her alcohol and drug histories not on file. family history includes Arthritis in her other; Diabetes in her other; and Heart disease in her other. Allergies  Allergen Reactions  . Atorvastatin     Does not remember  . Codeine Sulfate     GI upset      Review of Systems  Constitutional: Negative for fever and chills.  Respiratory: Negative for shortness of breath.   Cardiovascular: Negative for chest pain.  Gastrointestinal: Positive for diarrhea. Negative for nausea, vomiting, constipation and blood in stool.  Genitourinary: Positive for dysuria.  Neurological: Negative for dizziness.         Objective:   Physical Exam  Constitutional: She appears well-developed and well-nourished.  HENT:  Mouth/Throat: Oropharynx is clear and moist.  Neck: Neck supple. No thyromegaly present.  Cardiovascular: Normal rate and regular rhythm.   Pulmonary/Chest: Effort normal and breath sounds normal. No respiratory distress. She has no wheezes. She has no rales.  Abdominal: Soft. Bowel sounds are normal. She exhibits no distension and no mass. There is no tenderness. There is no rebound and no guarding.  Musculoskeletal: She exhibits no edema.          Assessment & Plan:  #1 dysuria. Urine dipstick is unremarkable. No evidence for UTI. Continue adequate hydration #2 intermittent diarrhea. Possibly related to metformin but she's been on this for quite some time without prior symptoms. Symptoms are actually somewhat improved today. We discussed the fact that she needs colonoscopy (no prior screening) but she's not willing to consider at this point. #3 type 2 diabetes. Poorly controlled. Patient is rescheduled for diabetes followup. Consider addition of insulin if A1c not improving. She has been very reluctant to consider insulin in past.  Hx of very poor compliance.

## 2012-09-11 NOTE — Patient Instructions (Addendum)
Follow up promptly for any fever, increased abdominal pain, or any persistent diarrhea.

## 2012-09-13 ENCOUNTER — Ambulatory Visit: Payer: Medicare Other | Admitting: Family Medicine

## 2012-10-12 ENCOUNTER — Ambulatory Visit: Payer: Medicare Other | Admitting: Family Medicine

## 2013-07-27 ENCOUNTER — Other Ambulatory Visit: Payer: Self-pay | Admitting: Family Medicine

## 2013-09-07 ENCOUNTER — Ambulatory Visit (INDEPENDENT_AMBULATORY_CARE_PROVIDER_SITE_OTHER): Payer: Medicare Other | Admitting: Family Medicine

## 2013-09-07 ENCOUNTER — Encounter: Payer: Self-pay | Admitting: Family Medicine

## 2013-09-07 VITALS — BP 126/70 | HR 104 | Temp 98.3°F | Wt 203.0 lb

## 2013-09-07 DIAGNOSIS — E785 Hyperlipidemia, unspecified: Secondary | ICD-10-CM

## 2013-09-07 DIAGNOSIS — E119 Type 2 diabetes mellitus without complications: Secondary | ICD-10-CM

## 2013-09-07 DIAGNOSIS — I10 Essential (primary) hypertension: Secondary | ICD-10-CM

## 2013-09-07 DIAGNOSIS — E669 Obesity, unspecified: Secondary | ICD-10-CM | POA: Insufficient documentation

## 2013-09-07 DIAGNOSIS — Z23 Encounter for immunization: Secondary | ICD-10-CM

## 2013-09-07 LAB — BASIC METABOLIC PANEL
BUN: 14 mg/dL (ref 6–23)
Calcium: 10.2 mg/dL (ref 8.4–10.5)
GFR: 94.87 mL/min (ref >60.00)
Glucose, Bld: 322 mg/dL — ABNORMAL HIGH (ref 70–99)

## 2013-09-07 LAB — LIPID PANEL: Cholesterol: 364 mg/dL — ABNORMAL HIGH (ref 0–200)

## 2013-09-07 LAB — HEPATIC FUNCTION PANEL
ALT: 36 U/L — ABNORMAL HIGH (ref 0–35)
AST: 38 U/L — ABNORMAL HIGH (ref 0–37)
Albumin: 4 g/dL (ref 3.5–5.2)
Alkaline Phosphatase: 49 U/L (ref 39–117)
Bilirubin, Direct: 0 mg/dL (ref 0.0–0.3)
Total Protein: 7.2 g/dL (ref 6.0–8.3)

## 2013-09-07 LAB — HM DIABETES EYE EXAM

## 2013-09-07 NOTE — Progress Notes (Signed)
  Subjective:    Patient ID: Mary Caldwell, female    DOB: July 31, 1946, 67 y.o.   MRN: [id number]  HPI Medical followup. History of very poor compliance. She has history of obesity, type 2 diabetes, hypertension, hyperlipidemia She has not been seen in almost a year. She states that she is still taking aspirin, Crestor, fenofibrate, metformin, and glimepiride. She does not monitor her blood sugars. Denies any polydipsia or polyuria. She has lost about 12 pounds last visit here. She states she has made some dietary changes. No history of confirmed Pneumovax. Also needs flu vaccine.  Denies any recent chest pains. No dyspnea. No recent falls.  Past Medical History  Diagnosis Date  . DIABETES-TYPE 2 02/11/2009  . HYPERLIPIDEMIA 02/11/2009  . OBESITY 02/11/2009  . ESSENTIAL HYPERTENSION 02/11/2009  . ASTHMA 02/11/2009  . ABDOMINAL ABSCESS 12/29/2009   Past Surgical History  Procedure Laterality Date  . Appendectomy  1957  . Abdominal hysterectomy  1985    reports that she has never smoked. She does not have any smokeless tobacco history on file. Her alcohol and drug histories are not on file. family history includes Arthritis in her other; Diabetes in her other; Heart disease in her other. Allergies  Allergen Reactions  . Atorvastatin     Does not remember  . Codeine Sulfate     GI upset      Review of Systems  Constitutional: Positive for fatigue. Negative for fever, activity change, appetite change and unexpected weight change.  HENT: Negative for hearing loss, sore throat and trouble swallowing.   Respiratory: Negative for cough, shortness of breath and wheezing.   Cardiovascular: Negative for chest pain, palpitations and leg swelling.  Gastrointestinal: Negative for nausea, vomiting, abdominal pain, blood in stool and abdominal distention.  Endocrine: Negative for polydipsia and polyuria.  Genitourinary: Negative for dysuria and hematuria.  Musculoskeletal: Negative for gait  problem and myalgias.  Skin: Negative for rash.  Neurological: Negative for dizziness, syncope, weakness and headaches.  Hematological: Negative for adenopathy.  Psychiatric/Behavioral: Negative for confusion and dysphoric mood.       Objective:   Physical Exam  Constitutional: She appears well-developed and well-nourished.  HENT:  Right Ear: External ear normal.  Left Ear: External ear normal.  Mouth/Throat: Oropharynx is clear and moist.  Neck: Neck supple. No thyromegaly present.  Cardiovascular: Normal rate and regular rhythm.   Pulmonary/Chest: Effort normal and breath sounds normal. No respiratory distress. She has no wheezes. She has no rales.  Musculoskeletal: She exhibits no edema.  Neurological: She is alert.  Skin: No rash noted.          Assessment & Plan:  #1 type 2 diabetes. History of poor control. History of poor compliance. Set up eye exam. Recheck A1c. May need to add insulin soon Pt needs to lose more weight and we discussed. #2 dyslipidemia. Repeat lipids and hepatic panel #3 hypertension stable at goal #4 health maintenance. Flu vaccine and Pneumovax given.

## 2013-09-07 NOTE — Patient Instructions (Signed)
Continue with weight loss Set up eye exam

## 2013-09-10 ENCOUNTER — Other Ambulatory Visit: Payer: Self-pay | Admitting: Family Medicine

## 2013-09-17 MED ORDER — OMEGA-3-ACID ETHYL ESTERS 1 G PO CAPS: ORAL_CAPSULE | ORAL | Status: DC

## 2013-09-17 NOTE — Addendum Note (Signed)
Addended by: Thomasena Edis on: 09/17/2013 01:27 PM   Modules accepted: Orders

## 2013-12-03 ENCOUNTER — Ambulatory Visit: Payer: Medicare Other | Admitting: Family Medicine

## 2013-12-17 ENCOUNTER — Ambulatory Visit (INDEPENDENT_AMBULATORY_CARE_PROVIDER_SITE_OTHER): Payer: Medicare HMO | Admitting: Family Medicine

## 2013-12-17 ENCOUNTER — Encounter: Payer: Self-pay | Admitting: Family Medicine

## 2013-12-17 VITALS — BP 128/70 | HR 100 | Temp 98.3°F | Wt 205.0 lb

## 2013-12-17 DIAGNOSIS — I10 Essential (primary) hypertension: Secondary | ICD-10-CM

## 2013-12-17 DIAGNOSIS — E119 Type 2 diabetes mellitus without complications: Secondary | ICD-10-CM

## 2013-12-17 DIAGNOSIS — E785 Hyperlipidemia, unspecified: Secondary | ICD-10-CM

## 2013-12-17 MED ORDER — CANAGLIFLOZIN 100 MG PO TABS: 1.0000 | ORAL_TABLET | Freq: Every day | ORAL | Status: DC

## 2013-12-17 NOTE — Progress Notes (Signed)
Pre visit review using our clinic review tool, if applicable. No additional management support is needed unless otherwise documented below in the visit note. 

## 2013-12-17 NOTE — Progress Notes (Signed)
   Subjective:    Patient ID: Mary Caldwell, female    DOB: [birth year], 68 y.o.   MRN: [id number]  HPI Patient seen for medical followup. Has history of very poor compliance. She states she developed some depression issues since last visit and was overeating. Her sugars have not been well controlled. Last A1c over 11%. She has refused insulin this point. She remains on metformin and glimepiride. Her lipids were extremely elevated with triglycerides over 2000. We had prescribed lovaza 1 g but she never got this filled.  She denies any chest pains. No specific complaints today. No dizziness. She states she is compliant with her Crestor, metformin, and glimepiride. She's not had recent eye exam. She does not have any significant retinopathy or neuropathy history.  Past Medical History  Diagnosis Date  . DIABETES-TYPE 2 02/11/2009  . HYPERLIPIDEMIA 02/11/2009  . OBESITY 02/11/2009  . ESSENTIAL HYPERTENSION 02/11/2009  . ASTHMA 02/11/2009  . ABDOMINAL ABSCESS 12/29/2009   Past Surgical History  Procedure Laterality Date  . Appendectomy  1957  . Abdominal hysterectomy  1985    reports that she has never smoked. She does not have any smokeless tobacco history on file. Her alcohol and drug histories are not on file. family history includes Arthritis in her other; Diabetes in her other; Heart disease in her other. Allergies  Allergen Reactions  . Atorvastatin     Does not remember  . Codeine Sulfate     GI upset      Review of Systems  Constitutional: Negative for fatigue.  Eyes: Negative for visual disturbance.  Respiratory: Negative for cough, chest tightness, shortness of breath and wheezing.   Cardiovascular: Negative for chest pain, palpitations and leg swelling.  Neurological: Negative for dizziness, seizures, syncope, weakness, light-headedness and headaches.       Objective:   Physical Exam  Constitutional: She appears well-developed and well-nourished.  Cardiovascular: Normal  rate and regular rhythm.   Pulmonary/Chest: Effort normal and breath sounds normal. No respiratory distress. She has no wheezes. She has no rales.  Musculoskeletal: She exhibits no edema.  Skin:  Feet reveal no skin lesions. Good distal foot pulses. Good capillary refill. No calluses. Normal sensation with monofilament testing           Assessment & Plan:  #1 type 2 diabetes. Very poor control. History of very poor compliance. We strongly recommended weight loss and discussed strategies. Add Invokana 100 mg once daily with samples provided. We elected not to check A1c today since we know her sugars are poorly controlled and she's not any changes since last visit #2 dyslipidemia with extremely high cholesterol and triglycerides. Continue Crestor. Continue fenofibrate. She strongly recommended start lovaza. Recheck lipids at followup.

## 2013-12-17 NOTE — Patient Instructions (Signed)
You need to lose some weight. Scale back sugar and starch intake. Set up eye appointment.

## 2013-12-19 ENCOUNTER — Telehealth: Payer: Self-pay

## 2013-12-19 NOTE — Telephone Encounter (Signed)
Relevant patient education mailed to patient.  

## 2013-12-26 ENCOUNTER — Telehealth: Payer: Self-pay | Admitting: Family Medicine

## 2013-12-26 NOTE — Telephone Encounter (Signed)
Relevant patient education mailed to patient.  

## 2014-06-12 ENCOUNTER — Telehealth: Payer: Self-pay | Admitting: *Deleted

## 2014-06-12 DIAGNOSIS — E119 Type 2 diabetes mellitus without complications: Secondary | ICD-10-CM

## 2014-06-12 NOTE — Telephone Encounter (Signed)
Spoke with patient and she will call back to schedule an appointment to follow up with her diabetes. A1c, bmet, micro albumin ordered Diabetic bundle

## 2014-09-25 ENCOUNTER — Other Ambulatory Visit: Payer: Self-pay | Admitting: Family Medicine

## 2014-10-01 ENCOUNTER — Ambulatory Visit (INDEPENDENT_AMBULATORY_CARE_PROVIDER_SITE_OTHER): Payer: Medicare HMO | Admitting: Family Medicine

## 2014-10-01 ENCOUNTER — Ambulatory Visit (INDEPENDENT_AMBULATORY_CARE_PROVIDER_SITE_OTHER): Payer: Medicare HMO

## 2014-10-01 ENCOUNTER — Encounter: Payer: Self-pay | Admitting: Family Medicine

## 2014-10-01 VITALS — BP 130/80 | HR 67 | Temp 98.2°F | Wt 200.0 lb

## 2014-10-01 DIAGNOSIS — E1165 Type 2 diabetes mellitus with hyperglycemia: Secondary | ICD-10-CM

## 2014-10-01 DIAGNOSIS — Z23 Encounter for immunization: Secondary | ICD-10-CM

## 2014-10-01 DIAGNOSIS — E1140 Type 2 diabetes mellitus with diabetic neuropathy, unspecified: Secondary | ICD-10-CM

## 2014-10-01 DIAGNOSIS — IMO0002 Reserved for concepts with insufficient information to code with codable children: Secondary | ICD-10-CM

## 2014-10-01 DIAGNOSIS — E785 Hyperlipidemia, unspecified: Secondary | ICD-10-CM

## 2014-10-01 DIAGNOSIS — I10 Essential (primary) hypertension: Secondary | ICD-10-CM

## 2014-10-01 LAB — BASIC METABOLIC PANEL
BUN: 15 mg/dL (ref 6–23)
CO2: 27 mEq/L (ref 19–32)
CREATININE: 0.6 mg/dL (ref 0.4–1.2)
Calcium: 9.7 mg/dL (ref 8.4–10.5)
Chloride: 100 mEq/L (ref 96–112)
GFR: 101.64 mL/min (ref >60.00)
Glucose, Bld: 261 mg/dL — ABNORMAL HIGH (ref 70–99)
Potassium: 4.2 mEq/L (ref 3.5–5.1)
Sodium: 136 mEq/L (ref 135–145)

## 2014-10-01 LAB — LIPID PANEL
CHOLESTEROL: 213 mg/dL — AB (ref 0–200)
HDL: 25.1 mg/dL — ABNORMAL LOW (ref >39.00)
NonHDL: 187.9
TRIGLYCERIDES: 901 mg/dL — AB (ref 0.0–149.0)
Total CHOL/HDL Ratio: 8
VLDL: 180.2 mg/dL — ABNORMAL HIGH (ref 0.0–40.0)

## 2014-10-01 LAB — HEPATIC FUNCTION PANEL
ALK PHOS: 41 U/L (ref 39–117)
ALT: 29 U/L (ref 0–35)
AST: 29 U/L (ref 0–37)
Albumin: 3.4 g/dL — ABNORMAL LOW (ref 3.5–5.2)
BILIRUBIN DIRECT: 0.1 mg/dL (ref 0.0–0.3)
TOTAL PROTEIN: 7 g/dL (ref 6.0–8.3)
Total Bilirubin: 0.5 mg/dL (ref 0.2–1.2)

## 2014-10-01 LAB — MICROALBUMIN / CREATININE URINE RATIO
Creatinine,U: 43.1 mg/dL
Microalb Creat Ratio: 0.5 mg/g (ref 0.0–30.0)
Microalb, Ur: 0.2 mg/dL (ref 0.0–1.9)

## 2014-10-01 LAB — LDL CHOLESTEROL, DIRECT: Direct LDL: 47.2 mg/dL

## 2014-10-01 LAB — HEMOGLOBIN A1C: Hgb A1c MFr Bld: 10 % — ABNORMAL HIGH (ref 4.6–6.5)

## 2014-10-01 NOTE — Patient Instructions (Signed)
Start back baby aspirin 81 mg once daily Start long-acting insulin pain units once daily and titrate up 2 units every 3 days for fasting blood sugars over 130 Once fasting blood sugars are around 130 or less, stop titration of insulin at that point Continue all of your usual oral medications that you're currently on

## 2014-10-01 NOTE — Progress Notes (Signed)
   Subjective:    Patient ID: Mary Caldwell, female    DOB: [birth year], 68 y.o.   MRN: [id number]  HPI Patient here for medical follow-up. Long history of very poor compliance. Morbid obesity, type 2 diabetes, hypertension, severe dyslipidemia. She quit taking Invokana because of lack of coverage. She remains on glimepiride, metformin, Crestor, fenofibrate. She was prescribed Lovaza but never started this last year because of lack of coverage. Last A1c 11.1%. Fasting blood sugars consistently over 200. She is also not taking aspirin once daily. Not checking blood sugars regularly. Very poor compliance with diet. Denies any recent chest pains or abdominal pain.  She's not had eye exam in over a year.  Past Medical History  Diagnosis Date  . DIABETES-TYPE 2 02/11/2009  . HYPERLIPIDEMIA 02/11/2009  . OBESITY 02/11/2009  . ESSENTIAL HYPERTENSION 02/11/2009  . ASTHMA 02/11/2009  . ABDOMINAL ABSCESS 12/29/2009   Past Surgical History  Procedure Laterality Date  . Appendectomy  1957  . Abdominal hysterectomy  1985    reports that she has never smoked. She does not have any smokeless tobacco history on file. Her alcohol and drug histories are not on file. family history includes Arthritis in her other; Diabetes in her other; Heart disease in her other. Allergies  Allergen Reactions  . Atorvastatin     Does not remember  . Codeine Sulfate     GI upset      Review of Systems  Constitutional: Positive for fatigue. Negative for appetite change and unexpected weight change.  Eyes: Negative for visual disturbance.  Respiratory: Negative for cough, chest tightness, shortness of breath and wheezing.   Cardiovascular: Negative for chest pain, palpitations and leg swelling.  Gastrointestinal: Negative for abdominal pain.  Genitourinary: Negative for dysuria.  Neurological: Negative for dizziness, seizures, syncope, weakness, light-headedness and headaches.       Objective:   Physical Exam    Constitutional: She is oriented to person, place, and time. She appears well-developed and well-nourished. No distress.  Neck: Neck supple. No thyromegaly present.  Cardiovascular: Normal rate and regular rhythm.   Pulmonary/Chest: Effort normal and breath sounds normal. No respiratory distress. She has no wheezes. She has no rales.  Musculoskeletal: She exhibits no edema.  Neurological: She is alert and oriented to person, place, and time.          Assessment & Plan:  #1 type 2 diabetes. Poorly controlled. She has normal monofilament testing but burning sensation suggesting some early neuropathy. Very poor compliance history. Recheck A1c. Start long-acting insulin 10 units once daily and titration regimen will be given and reassess in one month.  Needs eye exam and also suggest getting back on baby aspirin one daily #2 severe dyslipidemia. Recheck lipid panel. She's had severely elevated cholesterol and triglycerides in the past. Triglycerides be difficult to manage and lesser blood sugars improved #3 morbid obesity. We explained without weight loss will be very difficult to manage her diabetes or lipids. We've offered nutritional counseling

## 2014-10-01 NOTE — Progress Notes (Signed)
Pre visit review using our clinic review tool, if applicable. No additional management support is needed unless otherwise documented below in the visit note. 

## 2014-10-02 ENCOUNTER — Telehealth: Payer: Self-pay | Admitting: Family Medicine

## 2014-10-02 MED ORDER — GLIMEPIRIDE 4 MG PO TABS: ORAL_TABLET | ORAL | Status: DC

## 2014-10-02 MED ORDER — METFORMIN HCL 500 MG PO TABS: ORAL_TABLET | ORAL | Status: DC

## 2014-10-02 MED ORDER — FENOFIBRATE 160 MG PO TABS: ORAL_TABLET | ORAL | Status: DC

## 2014-10-02 MED ORDER — ROSUVASTATIN CALCIUM 20 MG PO TABS: ORAL_TABLET | ORAL | Status: DC

## 2014-10-02 MED ORDER — INSULIN GLARGINE 300 UNIT/ML ~~LOC~~ SOPN: 10.0000 [IU] | PEN_INJECTOR | Freq: Every day | SUBCUTANEOUS | Status: DC

## 2014-10-02 NOTE — Telephone Encounter (Signed)
Rx sent to pharmacy   

## 2014-10-02 NOTE — Telephone Encounter (Signed)
Pt request refill of the following: metFORMIN (GLUCOPHAGE) 500 MG tablet, Insulin Glargine (TOUJEO SOLOSTAR) 300 UNIT/ML SOPN, fenofibrate 160 MG tablet, glimepiride (AMARYL) 4 MG tablet  CRESTOR 20 MG tablet     Phamacy:  [facility] [address] [facility]

## 2014-10-31 ENCOUNTER — Ambulatory Visit (INDEPENDENT_AMBULATORY_CARE_PROVIDER_SITE_OTHER): Payer: Medicare HMO | Admitting: Family Medicine

## 2014-10-31 ENCOUNTER — Encounter: Payer: Self-pay | Admitting: Family Medicine

## 2014-10-31 VITALS — BP 130/78 | HR 77 | Temp 97.9°F | Wt 200.0 lb

## 2014-10-31 DIAGNOSIS — IMO0002 Reserved for concepts with insufficient information to code with codable children: Secondary | ICD-10-CM

## 2014-10-31 DIAGNOSIS — E1140 Type 2 diabetes mellitus with diabetic neuropathy, unspecified: Secondary | ICD-10-CM

## 2014-10-31 DIAGNOSIS — E1165 Type 2 diabetes mellitus with hyperglycemia: Secondary | ICD-10-CM

## 2014-10-31 NOTE — Progress Notes (Signed)
   Subjective:    Patient ID: Mary Caldwell, female    DOB: October 21, 1946, 68 y.o.   MRN: [id number]  HPI Follow-up type 2 diabetes. She has history of severe dyslipidemia as well. We started long-acting insulin injections last visit starting at 10 units once daily. She is titrating up 2 units every 3 days is currently at 28 units. Her fasting blood sugars are still around 200 but her overall these are somewhat improved. She has history of hypertriglyceridemia. Recent LDL 47. No hypoglycemia. Still needs to set up eye exam  Past Medical History  Diagnosis Date  . DIABETES-TYPE 2 02/11/2009  . HYPERLIPIDEMIA 02/11/2009  . OBESITY 02/11/2009  . ESSENTIAL HYPERTENSION 02/11/2009  . ASTHMA 02/11/2009  . ABDOMINAL ABSCESS 12/29/2009   Past Surgical History  Procedure Laterality Date  . Appendectomy  1957  . Abdominal hysterectomy  1985    reports that she has never smoked. She does not have any smokeless tobacco history on file. Her alcohol and drug histories are not on file. family history includes Arthritis in her other; Diabetes in her other; Heart disease in her other. Allergies  Allergen Reactions  . Atorvastatin     Does not remember  . Codeine Sulfate     GI upset      Review of Systems  Constitutional: Negative for fatigue.  Eyes: Negative for visual disturbance.  Respiratory: Negative for cough, chest tightness, shortness of breath and wheezing.   Cardiovascular: Negative for chest pain, palpitations and leg swelling.  Endocrine: Negative for polydipsia and polyuria.  Neurological: Negative for dizziness, seizures, syncope, weakness, light-headedness and headaches.       Objective:   Physical Exam  Constitutional: She appears well-developed and well-nourished.  Neck: Neck supple. No thyromegaly present.  Cardiovascular: Normal rate and regular rhythm.   Pulmonary/Chest: Effort normal and breath sounds normal. No respiratory distress. She has no wheezes. She has no rales.    Musculoskeletal: She exhibits no edema.          Assessment & Plan:  Type 2 diabetes. History of poor control. Needs to lose some weight. Continue titration for long-acting insulin. We discussed other options such as addition of SGT 2 inhibitor but cost may be an issue. Would be somewhat leery of trying GLP-1 class with increased risk of pancreatitis with very high triglycerides. We'll plan A1c at follow-up

## 2014-10-31 NOTE — Progress Notes (Signed)
Pre visit review using our clinic review tool, if applicable. No additional management support is needed unless otherwise documented below in the visit note. 

## 2014-10-31 NOTE — Patient Instructions (Signed)
Continue to titrate up your insulin 2 units every 3 days until fasting sugars around 130 or less.

## 2015-01-30 ENCOUNTER — Ambulatory Visit: Payer: Medicare HMO | Admitting: Family Medicine

## 2015-04-24 ENCOUNTER — Telehealth: Payer: Self-pay | Admitting: *Deleted

## 2015-04-24 NOTE — Telephone Encounter (Signed)
My chart message sent about mammogram.

## 2015-05-08 ENCOUNTER — Telehealth: Payer: Self-pay

## 2015-05-08 NOTE — Telephone Encounter (Signed)
Left message for patient to return call.

## 2015-05-08 NOTE — Telephone Encounter (Signed)
-----   Message from Marian Sorrow, LPN sent at 1/56/1537  1:19 PM EDT ----- I sent a My Chart message to pt regarding Mammogram due. Pt has not read my message. Please call pt.   Thanks, Butch Penny

## 2015-05-19 ENCOUNTER — Other Ambulatory Visit: Payer: Self-pay

## 2015-11-13 ENCOUNTER — Other Ambulatory Visit: Payer: Self-pay | Admitting: Family Medicine

## 2015-11-25 ENCOUNTER — Telehealth: Payer: Self-pay | Admitting: Family Medicine

## 2015-11-25 MED ORDER — METFORMIN HCL 500 MG PO TABS: ORAL_TABLET | ORAL | Status: DC

## 2015-11-25 NOTE — Telephone Encounter (Signed)
Patient called and requested a refill on her Metformin. She has an appt sched for 12/02/14. She uses the [facility] on [address]. Pt states she will run out of meds in a couple of days. Please advise.

## 2015-11-25 NOTE — Telephone Encounter (Signed)
Medication sent into pharmacy  

## 2015-12-03 ENCOUNTER — Encounter: Payer: Self-pay | Admitting: Family Medicine

## 2015-12-03 ENCOUNTER — Ambulatory Visit (INDEPENDENT_AMBULATORY_CARE_PROVIDER_SITE_OTHER): Payer: Commercial Managed Care - HMO | Admitting: Family Medicine

## 2015-12-03 VITALS — BP 122/92 | HR 82 | Temp 98.2°F | Ht 60.0 in | Wt 192.8 lb

## 2015-12-03 DIAGNOSIS — E1140 Type 2 diabetes mellitus with diabetic neuropathy, unspecified: Secondary | ICD-10-CM | POA: Diagnosis not present

## 2015-12-03 DIAGNOSIS — E1165 Type 2 diabetes mellitus with hyperglycemia: Secondary | ICD-10-CM

## 2015-12-03 DIAGNOSIS — E669 Obesity, unspecified: Secondary | ICD-10-CM

## 2015-12-03 DIAGNOSIS — E785 Hyperlipidemia, unspecified: Secondary | ICD-10-CM

## 2015-12-03 DIAGNOSIS — IMO0002 Reserved for concepts with insufficient information to code with codable children: Secondary | ICD-10-CM

## 2015-12-03 DIAGNOSIS — Z23 Encounter for immunization: Secondary | ICD-10-CM

## 2015-12-03 DIAGNOSIS — I10 Essential (primary) hypertension: Secondary | ICD-10-CM

## 2015-12-03 LAB — BASIC METABOLIC PANEL
BUN: 12 mg/dL (ref 6–23)
CO2: 28 meq/L (ref 19–32)
CREATININE: 0.61 mg/dL (ref 0.40–1.20)
Calcium: 9.6 mg/dL (ref 8.4–10.5)
Chloride: 100 mEq/L (ref 96–112)
GFR: 103.21 mL/min (ref >60.00)
GLUCOSE: 186 mg/dL — AB (ref 70–99)
Potassium: 4 mEq/L (ref 3.5–5.1)
Sodium: 138 mEq/L (ref 135–145)

## 2015-12-03 LAB — MICROALBUMIN / CREATININE URINE RATIO
Creatinine,U: 67.9 mg/dL
MICROALB/CREAT RATIO: 1 mg/g (ref 0.0–30.0)
Microalb, Ur: <0.7 mg/dL (ref 0.0–1.9)

## 2015-12-03 LAB — LIPID PANEL
CHOLESTEROL: 167 mg/dL (ref 0–200)
HDL: 26.5 mg/dL — ABNORMAL LOW (ref >39.00)
Total CHOL/HDL Ratio: 6
Triglycerides: 701 mg/dL — ABNORMAL HIGH (ref 0.0–149.0)

## 2015-12-03 LAB — HEPATIC FUNCTION PANEL
ALK PHOS: 40 U/L (ref 39–117)
ALT: 22 U/L (ref 0–35)
AST: 20 U/L (ref 0–37)
Albumin: 4.1 g/dL (ref 3.5–5.2)
Bilirubin, Direct: 0 mg/dL (ref 0.0–0.3)
Total Bilirubin: 0.4 mg/dL (ref 0.2–1.2)
Total Protein: 6.7 g/dL (ref 6.0–8.3)

## 2015-12-03 LAB — HEMOGLOBIN A1C: Hgb A1c MFr Bld: 8.9 % — ABNORMAL HIGH (ref 4.6–6.5)

## 2015-12-03 LAB — LDL CHOLESTEROL, DIRECT: Direct LDL: 37 mg/dL

## 2015-12-03 NOTE — Addendum Note (Signed)
Addended by: Elio Forget on: 12/03/2015 09:27 AM   Modules accepted: Orders

## 2015-12-03 NOTE — Patient Instructions (Signed)
Set up eye exam Start back Toujeo at 10 units once daily and titrate up 2 units every 3 days until fasting sugars <130.

## 2015-12-03 NOTE — Progress Notes (Signed)
   Subjective:    Patient ID: Mary Caldwell, female    DOB: 1946-07-21, 70 y.o.   MRN: [id number]  HPI Medical follow-up. Patient has history of poor compliance has not been seen in over one year. She has history of obesity, dyslipidemia, hypertension, type 2 diabetes  Diabetes. Not monitoring blood sugars. She remains on metformin and Amaryl. She has not been taking her insulin recently. Denies any side effects. No polyuria or polydipsia She has not had eye exam in over a year but is in process of setting up  History of severe dyslipidemia. She states she is taking Crestor and fenofibrate regularly. No recent chest pains. No history of peripheral vascular disease or CAD  Reported history of hypertension but currently not taking any medication.  Past Medical History  Diagnosis Date  . DIABETES-TYPE 2 02/11/2009  . HYPERLIPIDEMIA 02/11/2009  . OBESITY 02/11/2009  . ESSENTIAL HYPERTENSION 02/11/2009  . ASTHMA 02/11/2009  . ABDOMINAL ABSCESS 12/29/2009   Past Surgical History  Procedure Laterality Date  . Appendectomy  1957  . Abdominal hysterectomy  1985    reports that she has never smoked. She does not have any smokeless tobacco history on file. Her alcohol and drug histories are not on file. family history includes Arthritis in her other; Diabetes in her other; Heart disease in her other. Allergies  Allergen Reactions  . Atorvastatin     Does not remember  . Codeine Sulfate     GI upset      Review of Systems  Constitutional: Negative for fatigue.  Eyes: Negative for visual disturbance.  Respiratory: Negative for cough, chest tightness, shortness of breath and wheezing.   Cardiovascular: Negative for chest pain, palpitations and leg swelling.  Endocrine: Negative for polydipsia and polyuria.  Genitourinary: Negative for dysuria.  Neurological: Negative for dizziness, seizures, syncope, weakness, light-headedness and headaches.       Objective:   Physical Exam    Constitutional: She appears well-developed and well-nourished.  Eyes: Pupils are equal, round, and reactive to light.  Neck: Neck supple. No JVD present. No thyromegaly present.  Cardiovascular: Normal rate and regular rhythm.  Exam reveals no gallop.   Pulmonary/Chest: Effort normal and breath sounds normal. No respiratory distress. She has no wheezes. She has no rales.  Musculoskeletal: She exhibits no edema.  Neurological: She is alert.  Skin:  Feet reveal no skin lesions. Feet are dry but no other acute findings. Good distal foot pulses. Good capillary refill. No calluses. Normal sensation with monofilament testing           Assessment & Plan:  #1 type 2 diabetes. History of poor control and very poor compliance. Provide further samples of insulin. Check A1c and urine microalbumin. Set up eye exam. We discussed possible addition of GLP-1 class but she is very reluctant Routine follow-up 3 months schedule  #2 Dyslipidemia. Check lipid and hepatic panel. Continue current medications  #3 health maintenance. Flu vaccine and Prevnar 13 given. Set up eye exam as above  #4 borderline elevated blood pressure. Systolic is normal but borderline diastolic. Lose some weight and reassess 3 months. Consider ACE inhibitor at that point if still elevated

## 2015-12-03 NOTE — Progress Notes (Signed)
Pre visit review using our clinic review tool, if applicable. No additional management support is needed unless otherwise documented below in the visit note. 

## 2015-12-05 MED ORDER — FENOFIBRATE 160 MG PO TABS: ORAL_TABLET | ORAL | Status: DC

## 2015-12-05 MED ORDER — INSULIN GLARGINE 300 UNIT/ML ~~LOC~~ SOPN
10.0000 [IU] | PEN_INJECTOR | Freq: Every day | SUBCUTANEOUS | Status: DC
Start: 2015-12-05 — End: 2017-01-24

## 2015-12-05 NOTE — Addendum Note (Signed)
Addended by: Elio Forget on: 12/05/2015 01:08 PM   Modules accepted: Orders

## 2015-12-18 ENCOUNTER — Encounter: Payer: Self-pay | Admitting: Family Medicine

## 2015-12-18 ENCOUNTER — Ambulatory Visit (INDEPENDENT_AMBULATORY_CARE_PROVIDER_SITE_OTHER): Payer: Commercial Managed Care - HMO | Admitting: Family Medicine

## 2015-12-18 VITALS — BP 138/60 | Temp 98.3°F | Ht 60.0 in | Wt 191.8 lb

## 2015-12-18 DIAGNOSIS — E1165 Type 2 diabetes mellitus with hyperglycemia: Secondary | ICD-10-CM | POA: Diagnosis not present

## 2015-12-18 DIAGNOSIS — E1140 Type 2 diabetes mellitus with diabetic neuropathy, unspecified: Secondary | ICD-10-CM | POA: Diagnosis not present

## 2015-12-18 DIAGNOSIS — IMO0002 Reserved for concepts with insufficient information to code with codable children: Secondary | ICD-10-CM

## 2015-12-18 DIAGNOSIS — H6123 Impacted cerumen, bilateral: Secondary | ICD-10-CM | POA: Diagnosis not present

## 2015-12-18 NOTE — Progress Notes (Signed)
Pre visit review using our clinic review tool, if applicable. No additional management support is needed unless otherwise documented below in the visit note. 

## 2015-12-18 NOTE — Progress Notes (Signed)
   Subjective:    Patient ID: Mary Caldwell, female    DOB: [birth year], 70 y.o.   MRN: [id number]  HPI   acute visit. Patient is here with complaint of bilateral ear fullness left greater than right for the past week. She states she had a cold prior to this for about 2 weeks. Those symptoms have cleared. No fever. No drainage. Possibly slight decrease in hearing. No vertigo. No sinus congestion or pressure.  Type 2 diabetes with history of poor control. She had not been taking her insulin regularly. Recent A1c 8.9%. She is again encouraged to get back on this regularly.   Past Medical History  Diagnosis Date  . DIABETES-TYPE 2 02/11/2009  . HYPERLIPIDEMIA 02/11/2009  . OBESITY 02/11/2009  . ESSENTIAL HYPERTENSION 02/11/2009  . ASTHMA 02/11/2009  . ABDOMINAL ABSCESS 12/29/2009   Past Surgical History  Procedure Laterality Date  . Appendectomy  1957  . Abdominal hysterectomy  1985    reports that she has never smoked. She does not have any smokeless tobacco history on file. Her alcohol and drug histories are not on file. family history includes Arthritis in her other; Diabetes in her other; Heart disease in her other. Allergies  Allergen Reactions  . Atorvastatin     Does not remember  . Codeine Sulfate     GI upset     Review of Systems  HENT: Negative for congestion and ear discharge.   Neurological: Negative for dizziness and headaches.       Objective:   Physical Exam  Constitutional: She appears well-developed and well-nourished.  HENT:  Bilateral cerumen impaction.  Neck: Neck supple.  Cardiovascular: Normal rate and regular rhythm.   Pulmonary/Chest: Effort normal and breath sounds normal. No respiratory distress. She has no wheezes. She has no rales.  Lymphadenopathy:    She has no cervical adenopathy.          Assessment & Plan:  #1 cerumen impaction bilaterally. Removed with irrigation. Eardrums appear normal.  #2 type 2 diabetes poorly controlled. Recent  A1c has improved somewhat. Patient was not taking her insulin regularly and is encouraged to get back on this. Follow-up in 3 months repeat A1c then

## 2015-12-18 NOTE — Patient Instructions (Signed)
Cerumen Impaction The structures of the external ear canal secrete a waxy substance known as cerumen. Excess cerumen can build up in the ear canal, causing a condition known as cerumen impaction. Cerumen impaction can cause ear pain and disrupt the function of the ear. The rate of cerumen production differs for each individual. In certain individuals, the configuration of the ear canal may decrease his or her ability to naturally remove cerumen. CAUSES Cerumen impaction is caused by excessive cerumen production or buildup. RISK FACTORS  Frequent use of swabs to clean ears.  Having narrow ear canals.  Having eczema.  Being dehydrated. SIGNS AND SYMPTOMS  Diminished hearing.  Ear drainage.  Ear pain.  Ear itch. TREATMENT Treatment may involve:  Over-the-counter or prescription ear drops to soften the cerumen.  Removal of cerumen by a health care provider. This may be done with:  Irrigation with warm water. This is the most common method of removal.  Ear curettes and other instruments.  Surgery. This may be done in severe cases. HOME CARE INSTRUCTIONS  Take medicines only as directed by your health care provider.  Do not insert objects into the ear with the intent of cleaning the ear. PREVENTION  Do not insert objects into the ear, even with the intent of cleaning the ear. Removing cerumen as a part of normal hygiene is not necessary, and the use of swabs in the ear canal is not recommended.  Drink enough water to keep your urine clear or pale yellow.  Control your eczema if you have it. SEEK MEDICAL CARE IF:  You develop ear pain.  You develop bleeding from the ear.  The cerumen does not clear after you use ear drops as directed.   This information is not intended to replace advice given to you by your health care provider. Make sure you discuss any questions you have with your health care provider.   Document Released: 12/16/2004 Document Revised: 11/29/2014  Document Reviewed: 06/25/2015 Elsevier Interactive Patient Education 2016 [employer]  

## 2015-12-22 ENCOUNTER — Other Ambulatory Visit: Payer: Self-pay | Admitting: Family Medicine

## 2016-01-22 ENCOUNTER — Telehealth: Payer: Self-pay | Admitting: Family Medicine

## 2016-01-22 DIAGNOSIS — Z1211 Encounter for screening for malignant neoplasm of colon: Secondary | ICD-10-CM

## 2016-01-22 NOTE — Telephone Encounter (Signed)
Okay for referral? Im not seeing if she has ever had one.

## 2016-01-22 NOTE — Telephone Encounter (Signed)
yes

## 2016-01-22 NOTE — Telephone Encounter (Signed)
Referral placed.

## 2016-01-22 NOTE — Telephone Encounter (Signed)
Patient would like a referral for a colonoscopy  

## 2016-01-28 ENCOUNTER — Encounter: Payer: Self-pay | Admitting: Gastroenterology

## 2016-01-30 ENCOUNTER — Encounter: Payer: Self-pay | Admitting: Gastroenterology

## 2016-03-03 ENCOUNTER — Other Ambulatory Visit: Payer: Self-pay | Admitting: Family Medicine

## 2016-03-09 ENCOUNTER — Ambulatory Visit (AMBULATORY_SURGERY_CENTER): Payer: Self-pay

## 2016-03-09 VITALS — Ht 60.0 in | Wt 186.0 lb

## 2016-03-09 DIAGNOSIS — Z800 Family history of malignant neoplasm of digestive organs: Secondary | ICD-10-CM

## 2016-03-09 MED ORDER — SUPREP BOWEL PREP KIT 17.5-3.13-1.6 GM/177ML PO SOLN: 1.0000 | Freq: Once | ORAL | Status: DC

## 2016-03-09 NOTE — Progress Notes (Signed)
No allergies to eggs or soy No past problems with anesthesia No home oxygen No diet meds  Has email and internet; registered for emmi 

## 2016-03-18 ENCOUNTER — Other Ambulatory Visit: Payer: Self-pay | Admitting: Family Medicine

## 2016-03-23 ENCOUNTER — Ambulatory Visit (AMBULATORY_SURGERY_CENTER): Payer: Commercial Managed Care - HMO | Admitting: Gastroenterology

## 2016-03-23 ENCOUNTER — Encounter: Payer: Self-pay | Admitting: Gastroenterology

## 2016-03-23 VITALS — BP 105/66 | HR 66 | Temp 98.9°F | Resp 62 | Ht 60.0 in | Wt 186.0 lb

## 2016-03-23 DIAGNOSIS — Z800 Family history of malignant neoplasm of digestive organs: Secondary | ICD-10-CM | POA: Diagnosis not present

## 2016-03-23 DIAGNOSIS — E119 Type 2 diabetes mellitus without complications: Secondary | ICD-10-CM | POA: Diagnosis not present

## 2016-03-23 DIAGNOSIS — D123 Benign neoplasm of transverse colon: Secondary | ICD-10-CM | POA: Diagnosis not present

## 2016-03-23 DIAGNOSIS — Z1211 Encounter for screening for malignant neoplasm of colon: Secondary | ICD-10-CM | POA: Diagnosis present

## 2016-03-23 DIAGNOSIS — D124 Benign neoplasm of descending colon: Secondary | ICD-10-CM | POA: Diagnosis not present

## 2016-03-23 LAB — GLUCOSE, CAPILLARY
GLUCOSE-CAPILLARY: 218 mg/dL — AB (ref 65–99)
GLUCOSE-CAPILLARY: 166 mg/dL — AB (ref 65–99)

## 2016-03-23 MED ORDER — SODIUM CHLORIDE 0.9 % IV SOLN: 500.0000 mL | INTRAVENOUS | Status: DC

## 2016-03-23 NOTE — Progress Notes (Signed)
Called to room to assist during endoscopic procedure.  Patient ID and intended procedure confirmed with present staff. Received instructions for my participation in the procedure from the performing physician.  

## 2016-03-23 NOTE — Patient Instructions (Signed)
YOU HAD AN ENDOSCOPIC PROCEDURE TODAY AT THE [facility]:   Refer to the procedure report that was given to you for any specific questions about what was found during the examination.  If the procedure report does not answer your questions, please call your gastroenterologist to clarify.  If you requested that your care partner not be given the details of your procedure findings, then the procedure report has been included in a sealed envelope for you to review at your convenience later.  YOU SHOULD EXPECT: Some feelings of bloating in the abdomen. Passage of more gas than usual.  Walking can help get rid of the air that was put into your GI tract during the procedure and reduce the bloating. If you had a lower endoscopy (such as a colonoscopy or flexible sigmoidoscopy) you may notice spotting of blood in your stool or on the toilet paper. If you underwent a bowel prep for your procedure, you may not have a normal bowel movement for a few days.  Please Note:  You might notice some irritation and congestion in your nose or some drainage.  This is from the oxygen used during your procedure.  There is no need for concern and it should clear up in a day or so.  SYMPTOMS TO REPORT IMMEDIATELY:   Following lower endoscopy (colonoscopy or flexible sigmoidoscopy):  Excessive amounts of blood in the stool  Significant tenderness or worsening of abdominal pains  Swelling of the abdomen that is new, acute  Fever of 100F or higher   Following upper endoscopy (EGD)  Vomiting of blood or coffee ground material  New chest pain or pain under the shoulder blades  Painful or persistently difficult swallowing  New shortness of breath  Fever of 100F or higher  Black, tarry-looking stools  For urgent or emergent issues, a gastroenterologist can be reached at any hour by calling [phone].   DIET: Your first meal following the procedure should be a small meal and then it is ok to progress to  your normal diet. Heavy or fried foods are harder to digest and may make you feel nauseous or bloated.  Likewise, meals heavy in dairy and vegetables can increase bloating.  Drink plenty of fluids but you should avoid alcoholic beverages for 24 hours.  ACTIVITY:  You should plan to take it easy for the rest of today and you should NOT DRIVE or use heavy machinery until tomorrow (because of the sedation medicines used during the test).    FOLLOW UP: Our staff will call the number listed on your records the next business day following your procedure to check on you and address any questions or concerns that you may have regarding the information given to you following your procedure. If we do not reach you, we will leave a message.  However, if you are feeling well and you are not experiencing any problems, there is no need to return our call.  We will assume that you have returned to your regular daily activities without incident.  If any biopsies were taken you will be contacted by phone or by letter within the next 1-3 weeks.  Please call us at [phone] if you have not heard about the biopsies in 3 weeks.    SIGNATURES/CONFIDENTIALITY: You and/or your care partner have signed paperwork which will be entered into your electronic medical record.  These signatures attest to the fact that that the information above on your After Visit Summary has been reviewed   and is understood.  Full responsibility of the confidentiality of this discharge information lies with you and/or your care-partner.YOU HAD AN ENDOSCOPIC PROCEDURE TODAY AT [facility]:   Refer to the procedure report that was given to you for any specific questions about what was found during the examination.  If the procedure report does not answer your questions, please call your gastroenterologist to clarify.  If you requested that your care partner not be given the details of your procedure findings, then the procedure  report has been included in a sealed envelope for you to review at your convenience later.  YOU SHOULD EXPECT: Some feelings of bloating in the abdomen. Passage of more gas than usual.  Walking can help get rid of the air that was put into your GI tract during the procedure and reduce the bloating. If you had a lower endoscopy (such as a colonoscopy or flexible sigmoidoscopy) you may notice spotting of blood in your stool or on the toilet paper. If you underwent a bowel prep for your procedure, you may not have a normal bowel movement for a few days.  Please Note:  You might notice some irritation and congestion in your nose or some drainage.  This is from the oxygen used during your procedure.  There is no need for concern and it should clear up in a day or so.  SYMPTOMS TO REPORT IMMEDIATELY:   Following lower endoscopy (colonoscopy or flexible sigmoidoscopy):  Excessive amounts of blood in the stool  Significant tenderness or worsening of abdominal pains  Swelling of the abdomen that is new, acute  Fever of 100F or higher    For urgent or emergent issues, a gastroenterologist can be reached at any hour by calling [phone].   DIET: Your first meal following the procedure should be a small meal and then it is ok to progress to your normal diet. Heavy or fried foods are harder to digest and may make you feel nauseous or bloated.  Likewise, meals heavy in dairy and vegetables can increase bloating.  Drink plenty of fluids but you should avoid alcoholic beverages for 24 hours.  ACTIVITY:  You should plan to take it easy for the rest of today and you should NOT DRIVE or use heavy machinery until tomorrow (because of the sedation medicines used during the test).    FOLLOW UP: Our staff will call the number listed on your records the next business day following your procedure to check on you and address any questions or concerns that you may have regarding the information given to you  following your procedure. If we do not reach you, we will leave a message.  However, if you are feeling well and you are not experiencing any problems, there is no need to return our call.  We will assume that you have returned to your regular daily activities without incident.  If any biopsies were taken you will be contacted by phone or by letter within the next 1-3 weeks.  Please call us at [phone] if you have not heard about the biopsies in 3 weeks.    SIGNATURES/CONFIDENTIALITY: You and/or your care partner have signed paperwork which will be entered into your electronic medical record.  These signatures attest to the fact that that the information above on your After Visit Summary has been reviewed and is understood.  Full responsibility of the confidentiality of this discharge information lies with you and/or your care-partner.  Polyp, diverticulosis, and high  fiber diet information given.  Return 5 years.

## 2016-03-23 NOTE — Progress Notes (Signed)
Report to PACU, RN, vss, BBS= Clear.  

## 2016-03-23 NOTE — Op Note (Signed)
[facility] Patient Name: Mary Caldwell Procedure Date: 03/23/2016 11:50 AM MRN: [id number] Endoscopist: Ladene Artist , MD Age: 70 Date of Birth: 07/11/1946 Gender: Female Procedure:                Colonoscopy Indications:              Screening in patient at increased risk: Colorectal                            cancer in sister 56 or older Medicines:                Monitored Anesthesia Care Procedure:                Pre-Anesthesia Assessment:                           - Prior to the procedure, a History and Physical                            was performed, and patient medications and                            allergies were reviewed. The patient's tolerance of                            previous anesthesia was also reviewed. The risks                            and benefits of the procedure and the sedation                            options and risks were discussed with the patient.                            All questions were answered, and informed consent                            was obtained. Prior Anticoagulants: The patient has                            taken no previous anticoagulant or antiplatelet                            agents. ASA Grade Assessment: II - A patient with                            mild systemic disease. After reviewing the risks                            and benefits, the patient was deemed in                            satisfactory condition to undergo the procedure.  After obtaining informed consent, the colonoscope                            was passed under direct vision. Throughout the                            procedure, the patient's blood pressure, pulse, and                            oxygen saturations were monitored continuously. The                            Model PCF-[id number]) scope was introduced                            through the anus and advanced to the the cecum,          identified by appendiceal orifice and ileocecal                            valve. The colonoscopy was performed without                            difficulty. The patient tolerated the procedure                            well. The quality of the bowel preparation was                            good. The ileocecal valve, appendiceal orifice, and                            rectum were photographed. Scope In: 11:57:56 AM Scope Out: 12:11:37 PM Scope Withdrawal Time: 0 hours 11 minutes 42 seconds  Total Procedure Duration: 0 hours 13 minutes 41 seconds  Findings:                 The digital rectal exam was normal.                           A 8 mm polyp was found in the transverse colon. The                            polyp was sessile. The polyp was removed with a                            cold snare. Resection and retrieval were complete.                           A 5 mm polyp was found in the descending colon. The                            polyp was sessile. The polyp was removed with a  cold snare. Resection and retrieval were complete.                           Multiple small-mouthed diverticula were found in                            the sigmoid colon, descending colon and transverse                            colon. There was no evidence of diverticular                            bleeding.                           The exam was otherwise normal throughout the                            examined colon.                           The retroflexed view of the distal rectum and anal                            verge was normal and showed no anal or rectal                            abnormalities. Complications:            No immediate complications. Estimated Blood Loss:     Estimated blood loss: none. Impression:               - One 8 mm polyp in the transverse colon, removed                            with a cold snare. Resected and retrieved.                            - One 5 mm polyp in the descending colon, removed                            with a cold snare. Resected and retrieved.                           - Moderate diverticulosis in the sigmoid colon, in                            the descending colon and in the transverse colon. Recommendation:           - Patient has a contact number available for                            emergencies. The signs and symptoms of potential  delayed complications were discussed with the                            patient. Return to normal activities tomorrow.                            Written discharge instructions were provided to the                            patient.                           - Resume previous diet.                           - Continue present medications.                           - Await pathology results.                           - Repeat colonoscopy in 5 years for surveillance. Ladene Artist, MD 03/23/2016 12:16:15 PM This report has been signed electronically.

## 2016-03-24 ENCOUNTER — Telehealth: Payer: Self-pay

## 2016-03-24 NOTE — Telephone Encounter (Signed)
  Follow up Call-  Call back number 03/23/2016  Post procedure Call Back phone  # [phone]  Permission to leave phone message Yes     Patient questions:  Do you have a fever, pain , or abdominal swelling? No. Pain Score  0 *  Have you tolerated food without any problems? Yes.    Have you been able to return to your normal activities? Yes.    Do you have any questions about your discharge instructions: Diet   No. Medications  No. Follow up visit  No.  Do you have questions or concerns about your Care? No.  Actions: * If pain score is 4 or above: No action needed, pain <4.

## 2016-03-31 ENCOUNTER — Encounter: Payer: Self-pay | Admitting: Gastroenterology

## 2016-04-18 ENCOUNTER — Emergency Department (HOSPITAL_COMMUNITY): Payer: Commercial Managed Care - HMO

## 2016-04-18 ENCOUNTER — Encounter (HOSPITAL_COMMUNITY): Payer: Self-pay

## 2016-04-18 ENCOUNTER — Observation Stay (HOSPITAL_COMMUNITY)
Admission: EM | Admit: 2016-04-18 | Discharge: 2016-04-19 | Disposition: A | Discharge status: Home / Self Care | Payer: Commercial Managed Care - HMO | Attending: Emergency Medicine | Admitting: Emergency Medicine

## 2016-04-18 DIAGNOSIS — R0902 Hypoxemia: Secondary | ICD-10-CM | POA: Diagnosis present

## 2016-04-18 DIAGNOSIS — J45909 Unspecified asthma, uncomplicated: Secondary | ICD-10-CM | POA: Diagnosis not present

## 2016-04-18 DIAGNOSIS — E785 Hyperlipidemia, unspecified: Secondary | ICD-10-CM | POA: Insufficient documentation

## 2016-04-18 DIAGNOSIS — R002 Palpitations: Principal | ICD-10-CM | POA: Insufficient documentation

## 2016-04-18 DIAGNOSIS — Z794 Long term (current) use of insulin: Secondary | ICD-10-CM | POA: Diagnosis not present

## 2016-04-18 DIAGNOSIS — R0789 Other chest pain: Secondary | ICD-10-CM | POA: Diagnosis not present

## 2016-04-18 DIAGNOSIS — E119 Type 2 diabetes mellitus without complications: Secondary | ICD-10-CM | POA: Diagnosis not present

## 2016-04-18 DIAGNOSIS — I10 Essential (primary) hypertension: Secondary | ICD-10-CM | POA: Diagnosis not present

## 2016-04-18 DIAGNOSIS — R079 Chest pain, unspecified: Secondary | ICD-10-CM | POA: Diagnosis not present

## 2016-04-18 DIAGNOSIS — E669 Obesity, unspecified: Secondary | ICD-10-CM | POA: Insufficient documentation

## 2016-04-18 DIAGNOSIS — M549 Dorsalgia, unspecified: Secondary | ICD-10-CM | POA: Insufficient documentation

## 2016-04-18 LAB — BASIC METABOLIC PANEL
ANION GAP: 12 (ref 5–15)
BUN: 13 mg/dL (ref 6–20)
CALCIUM: 10.1 mg/dL (ref 8.9–10.3)
CO2: 23 mmol/L (ref 22–32)
Chloride: 100 mmol/L — ABNORMAL LOW (ref 101–111)
Creatinine, Ser: 0.98 mg/dL (ref 0.44–1.00)
GFR, EST NON AFRICAN AMERICAN: 58 mL/min — AB (ref >60)
GLUCOSE: 289 mg/dL — AB (ref 65–99)
Potassium: 3.8 mmol/L (ref 3.5–5.1)
Sodium: 135 mmol/L (ref 135–145)

## 2016-04-18 LAB — CBC
HCT: 38.5 % (ref 36.0–46.0)
HEMOGLOBIN: 13.1 g/dL (ref 12.0–15.0)
MCH: 30.5 pg (ref 26.0–34.0)
MCHC: 34 g/dL (ref 30.0–36.0)
MCV: 89.5 fL (ref 78.0–100.0)
Platelets: 242 10*3/uL (ref 150–400)
RBC: 4.3 MIL/uL (ref 3.87–5.11)
RDW: 12.8 % (ref 11.5–15.5)
WBC: 6.8 10*3/uL (ref 4.0–10.5)

## 2016-04-18 LAB — I-STAT TROPONIN, ED: TROPONIN I, POC: 0 ng/mL (ref 0.00–0.08)

## 2016-04-18 LAB — TROPONIN I: Troponin I: <0.03 ng/mL (ref <0.031)

## 2016-04-18 MED ORDER — IOPAMIDOL (ISOVUE-370) INJECTION 76%
INTRAVENOUS | Status: AC
  Administered 2016-04-18: 80 mL
  Filled 2016-04-18: qty 100

## 2016-04-18 MED ORDER — GI COCKTAIL ~~LOC~~
30.0000 mL | Freq: Once | ORAL | Status: AC
  Administered 2016-04-18: 30 mL via ORAL

## 2016-04-18 NOTE — ED Notes (Signed)
Patient here with complaint of feeling like heart racing x 2 days. Today developed scapula pain and feeling weak. No distress on arrival, deniEs CP

## 2016-04-18 NOTE — ED Notes (Signed)
Pt ambulated to restroom. 
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
## 2016-05-11 ENCOUNTER — Ambulatory Visit (INDEPENDENT_AMBULATORY_CARE_PROVIDER_SITE_OTHER): Payer: Commercial Managed Care - HMO | Admitting: Family Medicine

## 2016-05-11 VITALS — BP 110/80 | HR 102 | Temp 98.0°F | Ht 62.0 in | Wt 194.0 lb

## 2016-05-11 DIAGNOSIS — R079 Chest pain, unspecified: Secondary | ICD-10-CM

## 2016-05-11 NOTE — Progress Notes (Signed)
Pre visit review using our clinic review tool, if applicable. No additional management support is needed unless otherwise documented below in the visit note. 

## 2016-05-11 NOTE — Patient Instructions (Signed)
Chest Pain Observation It is often hard to give a specific diagnosis for the cause of chest pain. Among other possibilities your symptoms might be caused by inadequate oxygen delivery to your heart (angina). Angina that is not treated or evaluated can lead to a heart attack (myocardial infarction) or death. Blood tests, electrocardiograms, and X-rays may have been done to help determine a possible cause of your chest pain. After evaluation and observation, your health care provider has determined that it is unlikely your pain was caused by an unstable condition that requires hospitalization. However, a full evaluation of your pain may need to be completed, with additional diagnostic testing as directed. It is very important to keep your follow-up appointments. Not keeping your follow-up appointments could result in permanent heart damage, disability, or death. If there is any problem keeping your follow-up appointments, you must call your health care provider. HOME CARE INSTRUCTIONS  Due to the slight chance that your pain could be angina, it is important to follow your health care provider's treatment plan and also maintain a healthy lifestyle:  Maintain or work toward achieving a healthy weight.  Stay physically active and exercise regularly.  Decrease your salt intake.  Eat a balanced, healthy diet. Talk to a dietitian to learn about heart-healthy foods.  Increase your fiber intake by including whole grains, vegetables, fruits, and nuts in your diet.  Avoid situations that cause stress, anger, or depression.  Take medicines as advised by your health care provider. Report any side effects to your health care provider. Do not stop medicines or adjust the dosages on your own.  Quit smoking. Do not use nicotine patches or gum until you check with your health care provider.  Keep your blood pressure, blood sugar, and cholesterol levels within normal limits.  Limit alcohol intake to no more than  1 drink per day for women who are not pregnant and 2 drinks per day for men.  Do not abuse drugs. SEEK IMMEDIATE MEDICAL CARE IF: You have severe chest pain or pressure which may include symptoms such as:  You feel pain or pressure in your arms, neck, jaw, or back.  You have severe back or abdominal pain, feel sick to your stomach (nauseous), or throw up (vomit).  You are sweating profusely.  You are having a fast or irregular heartbeat.  You feel short of breath while at rest.  You notice increasing shortness of breath during rest, sleep, or with activity.  You have chest pain that does not get better after rest or after taking your usual medicine.  You wake from sleep with chest pain.  You are unable to sleep because you cannot breathe.  You develop a frequent cough or you are coughing up blood.  You feel dizzy, faint, or experience extreme fatigue.  You develop severe weakness, dizziness, fainting, or chills. Any of these symptoms may represent a serious problem that is an emergency. Do not wait to see if the symptoms will go away. Call your local emergency services (911 in the [place] Do not drive yourself to the hospital. MAKE SURE YOU:  Understand these instructions.  Will watch your condition.  Will get help right away if you are not doing well or get worse.   This information is not intended to replace advice given to you by your health care provider. Make sure you discuss any questions you have with your health care provider.   Document Released: 12/11/2010 Document Revised: 11/13/2013 Document Reviewed: 05/10/2013 Elsevier Interactive Patient  Education 2016 [employer].  We will set up nuclear stress test -get back on 81 mg aspirin daily. -start OTC Nexium or Prilosec -follow up for any progressive or persistent chest pains.
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
## 2016-05-24 ENCOUNTER — Ambulatory Visit: Payer: Commercial Managed Care - HMO | Admitting: Family Medicine

## 2016-05-24 ENCOUNTER — Telehealth (HOSPITAL_COMMUNITY): Payer: Self-pay | Admitting: *Deleted

## 2016-05-24 NOTE — Telephone Encounter (Signed)
Patient given detailed instructions per Myocardial Perfusion Study Information Sheet for the test on 05/27/16 at 0730. Patient notified to arrive 15 minutes early and that it is imperative to arrive on time for appointment to keep from having the test rescheduled.  If you need to cancel or reschedule your appointment, please call the office within 24 hours of your appointment. Failure to do so may result in a cancellation of your appointment, and a $50 no show fee. Patient verbalized understanding.Mallie Linnemann, [place]

## 2016-05-27 ENCOUNTER — Ambulatory Visit (HOSPITAL_COMMUNITY): Payer: Commercial Managed Care - HMO | Attending: Internal Medicine

## 2016-05-27 DIAGNOSIS — R079 Chest pain, unspecified: Secondary | ICD-10-CM | POA: Insufficient documentation

## 2016-05-27 DIAGNOSIS — I10 Essential (primary) hypertension: Secondary | ICD-10-CM | POA: Diagnosis not present

## 2016-05-27 DIAGNOSIS — R002 Palpitations: Secondary | ICD-10-CM | POA: Diagnosis not present

## 2016-05-27 DIAGNOSIS — E109 Type 1 diabetes mellitus without complications: Secondary | ICD-10-CM | POA: Diagnosis not present

## 2016-05-27 DIAGNOSIS — R0602 Shortness of breath: Secondary | ICD-10-CM | POA: Insufficient documentation

## 2016-05-27 LAB — MYOCARDIAL PERFUSION IMAGING
CHL CUP NUCLEAR SDS: 6
CHL CUP NUCLEAR SRS: 2
CHL CUP NUCLEAR SSS: 8
LHR: 0.36
LV sys vol: 21 mL
LVDIAVOL: 58 mL (ref 46–106)
NUC STRESS TID: 0.84
Peak HR: 105 {beats}/min
Rest HR: 81 {beats}/min

## 2016-05-27 MED ORDER — TECHNETIUM TC 99M TETROFOSMIN IV KIT
32.4000 | PACK | Freq: Once | INTRAVENOUS | Status: AC | PRN
  Administered 2016-05-27: 32 via INTRAVENOUS
  Filled 2016-05-27: qty 32

## 2016-05-27 MED ORDER — TECHNETIUM TC 99M TETROFOSMIN IV KIT
10.2000 | PACK | Freq: Once | INTRAVENOUS | Status: AC | PRN
  Administered 2016-05-27: 10 via INTRAVENOUS
  Filled 2016-05-27: qty 10

## 2016-05-27 MED ORDER — REGADENOSON 0.4 MG/5ML IV SOLN
0.4000 mg | Freq: Once | INTRAVENOUS | Status: AC
  Administered 2016-05-27: 0.4 mg via INTRAVENOUS

## 2016-06-14 ENCOUNTER — Ambulatory Visit: Payer: Commercial Managed Care - HMO | Admitting: Family Medicine

## 2016-07-05 ENCOUNTER — Other Ambulatory Visit: Payer: Self-pay | Admitting: Family Medicine

## 2016-09-27 ENCOUNTER — Ambulatory Visit (INDEPENDENT_AMBULATORY_CARE_PROVIDER_SITE_OTHER): Payer: Commercial Managed Care - HMO

## 2016-09-27 DIAGNOSIS — Z23 Encounter for immunization: Secondary | ICD-10-CM | POA: Diagnosis not present

## 2016-11-29 ENCOUNTER — Telehealth: Payer: Self-pay | Admitting: Family Medicine

## 2016-11-29 NOTE — Telephone Encounter (Signed)
I tried calling pt but did not get any answer.  I would talk with patient first to get her feedback on symptoms.

## 2016-11-29 NOTE — Telephone Encounter (Addendum)
Mary Caldwell, pt's sister called to ask for pt if Dr Elease Hashimoto will call something in for pt's nerves, state of mind, asap Pt's husband, Jaqulyn Skurka, passed unexpectedly after having toe amputated and then had cardiac arrest due to complications. Pt has taken this very hard, cannot sleep, and the funeral is tomorrow.  Katharine Look does not feel pt is able to come into the dr. She has been staying with pt since then.   [facility] [address]  (Please note this is a different pharmacy than usual.)

## 2016-11-29 NOTE — Telephone Encounter (Signed)
Please advise 

## 2016-11-30 NOTE — Telephone Encounter (Signed)
Tried calling pt with no answer. 

## 2016-12-01 NOTE — Telephone Encounter (Signed)
Agree with advice. Thanks.

## 2016-12-01 NOTE — Telephone Encounter (Signed)
After speaking with pt her anxiety feel seems to be stable but is unable to sleep much. She is going to try OTC sleep aids. If she needs anything from [place] she will schedule a follow up. I did express my condolences.

## 2016-12-03 ENCOUNTER — Other Ambulatory Visit: Payer: Self-pay | Admitting: Family Medicine

## 2017-01-24 ENCOUNTER — Other Ambulatory Visit: Payer: Self-pay | Admitting: *Deleted

## 2017-01-24 ENCOUNTER — Encounter: Payer: Self-pay | Admitting: Family Medicine

## 2017-01-24 ENCOUNTER — Ambulatory Visit (INDEPENDENT_AMBULATORY_CARE_PROVIDER_SITE_OTHER): Payer: Medicare HMO | Admitting: Family Medicine

## 2017-01-24 VITALS — BP 104/78 | HR 97 | Temp 98.9°F | Wt 179.5 lb

## 2017-01-24 DIAGNOSIS — I10 Essential (primary) hypertension: Secondary | ICD-10-CM

## 2017-01-24 DIAGNOSIS — E1165 Type 2 diabetes mellitus with hyperglycemia: Secondary | ICD-10-CM | POA: Diagnosis not present

## 2017-01-24 DIAGNOSIS — IMO0002 Reserved for concepts with insufficient information to code with codable children: Secondary | ICD-10-CM

## 2017-01-24 DIAGNOSIS — E1140 Type 2 diabetes mellitus with diabetic neuropathy, unspecified: Secondary | ICD-10-CM | POA: Diagnosis not present

## 2017-01-24 DIAGNOSIS — E785 Hyperlipidemia, unspecified: Secondary | ICD-10-CM | POA: Diagnosis not present

## 2017-01-24 LAB — BASIC METABOLIC PANEL
BUN: 12 mg/dL (ref 6–23)
CALCIUM: 9.5 mg/dL (ref 8.4–10.5)
CHLORIDE: 100 meq/L (ref 96–112)
CO2: 26 mEq/L (ref 19–32)
CREATININE: 0.65 mg/dL (ref 0.40–1.20)
GFR: 95.6 mL/min (ref >60.00)
Glucose, Bld: 184 mg/dL — ABNORMAL HIGH (ref 70–99)
Potassium: 4.1 mEq/L (ref 3.5–5.1)
Sodium: 138 mEq/L (ref 135–145)

## 2017-01-24 LAB — HEPATIC FUNCTION PANEL
ALT: 17 U/L (ref 0–35)
AST: 19 U/L (ref 0–37)
Albumin: 4.2 g/dL (ref 3.5–5.2)
Alkaline Phosphatase: 41 U/L (ref 39–117)
BILIRUBIN DIRECT: 0.1 mg/dL (ref 0.0–0.3)
BILIRUBIN TOTAL: 0.6 mg/dL (ref 0.2–1.2)
Total Protein: 6.6 g/dL (ref 6.0–8.3)

## 2017-01-24 LAB — LIPID PANEL
CHOLESTEROL: 176 mg/dL (ref 0–200)
HDL: 35.3 mg/dL — ABNORMAL LOW (ref >39.00)
Total CHOL/HDL Ratio: 5
Triglycerides: 498 mg/dL — ABNORMAL HIGH (ref 0.0–149.0)

## 2017-01-24 LAB — MICROALBUMIN / CREATININE URINE RATIO
Creatinine,U: 129.4 mg/dL
Microalb Creat Ratio: 1.2 mg/g (ref 0.0–30.0)
Microalb, Ur: 1.6 mg/dL (ref 0.0–1.9)

## 2017-01-24 LAB — HEMOGLOBIN A1C: HEMOGLOBIN A1C: 7.7 % — AB (ref 4.6–6.5)

## 2017-01-24 LAB — LDL CHOLESTEROL, DIRECT: LDL DIRECT: 55 mg/dL

## 2017-01-24 MED ORDER — ROSUVASTATIN CALCIUM 20 MG PO TABS: 20.0000 mg | ORAL_TABLET | Freq: Every day | ORAL | 1 refills | Status: DC

## 2017-01-24 MED ORDER — GLIMEPIRIDE 4 MG PO TABS: 4.0000 mg | ORAL_TABLET | Freq: Every morning | ORAL | 1 refills | Status: DC

## 2017-01-24 MED ORDER — INSULIN GLARGINE 300 UNIT/ML ~~LOC~~ SOPN: 10.0000 [IU] | PEN_INJECTOR | Freq: Every day | SUBCUTANEOUS | 5 refills | Status: DC

## 2017-01-24 MED ORDER — FENOFIBRATE 160 MG PO TABS: ORAL_TABLET | ORAL | 1 refills | Status: DC

## 2017-01-24 NOTE — Progress Notes (Signed)
Pre visit review using our clinic review tool, if applicable. No additional management support is needed unless otherwise documented below in the visit note. Samples of this drug were given to the patient, quantity 2 pens, Lot Number [id number]
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
## 2017-01-24 NOTE — Patient Instructions (Signed)
Set up eye exam Lose some weight Start the Toujeo at 10 units once daily and increase 2 units every other day until fasting sugars around 130.

## 2017-02-03 ENCOUNTER — Encounter: Payer: Self-pay | Admitting: *Deleted

## 2017-02-11 ENCOUNTER — Telehealth: Payer: Self-pay | Admitting: Family Medicine

## 2017-02-11 NOTE — Telephone Encounter (Signed)
Patient is aware of lab results.

## 2017-02-11 NOTE — Telephone Encounter (Signed)
Pt calling to get lab results.

## 2017-02-11 NOTE — Telephone Encounter (Signed)
Left message on machine for patient to return our call 

## 2017-03-16 DIAGNOSIS — H524 Presbyopia: Secondary | ICD-10-CM | POA: Diagnosis not present

## 2017-03-16 LAB — HM DIABETES EYE EXAM

## 2017-03-22 ENCOUNTER — Encounter: Payer: Self-pay | Admitting: Family Medicine

## 2017-03-28 ENCOUNTER — Ambulatory Visit: Payer: Medicare HMO | Admitting: Family Medicine

## 2017-04-01 ENCOUNTER — Ambulatory Visit (INDEPENDENT_AMBULATORY_CARE_PROVIDER_SITE_OTHER): Payer: Medicare HMO | Admitting: Family Medicine

## 2017-04-01 ENCOUNTER — Encounter: Payer: Self-pay | Admitting: Family Medicine

## 2017-04-01 VITALS — BP 110/70 | HR 70 | Temp 98.5°F | Wt 180.0 lb

## 2017-04-01 DIAGNOSIS — I10 Essential (primary) hypertension: Secondary | ICD-10-CM | POA: Diagnosis not present

## 2017-04-01 DIAGNOSIS — E669 Obesity, unspecified: Secondary | ICD-10-CM | POA: Diagnosis not present

## 2017-04-01 DIAGNOSIS — E1165 Type 2 diabetes mellitus with hyperglycemia: Secondary | ICD-10-CM | POA: Diagnosis not present

## 2017-04-01 DIAGNOSIS — L821 Other seborrheic keratosis: Secondary | ICD-10-CM | POA: Diagnosis not present

## 2017-04-01 DIAGNOSIS — IMO0002 Reserved for concepts with insufficient information to code with codable children: Secondary | ICD-10-CM

## 2017-04-01 DIAGNOSIS — E1140 Type 2 diabetes mellitus with diabetic neuropathy, unspecified: Secondary | ICD-10-CM | POA: Diagnosis not present

## 2017-04-01 NOTE — Patient Instructions (Signed)
Reduce sugars and starches We need for you to lose some weight Let's plan on 2 month follow up
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
## 2017-06-17 ENCOUNTER — Encounter: Payer: Self-pay | Admitting: Family Medicine

## 2017-06-17 ENCOUNTER — Ambulatory Visit (INDEPENDENT_AMBULATORY_CARE_PROVIDER_SITE_OTHER): Payer: Medicare HMO | Admitting: Family Medicine

## 2017-06-17 DIAGNOSIS — E1140 Type 2 diabetes mellitus with diabetic neuropathy, unspecified: Secondary | ICD-10-CM

## 2017-06-17 DIAGNOSIS — E1165 Type 2 diabetes mellitus with hyperglycemia: Secondary | ICD-10-CM

## 2017-06-17 DIAGNOSIS — IMO0002 Reserved for concepts with insufficient information to code with codable children: Secondary | ICD-10-CM

## 2017-06-17 LAB — POCT GLYCOSYLATED HEMOGLOBIN (HGB A1C): HEMOGLOBIN A1C: 6
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
## 2017-06-17 NOTE — Patient Instructions (Signed)
Your A1C is 6.0 which is excellent Reduce breads and sweets.   Set up Medicare Annual Wellness Visit.

## 2017-07-19 ENCOUNTER — Telehealth: Payer: Self-pay | Admitting: Family Medicine

## 2017-07-19 NOTE — Telephone Encounter (Signed)
This is the first request I've seen.  Refill for 6 months.  Change dose to 10 units once daily

## 2017-07-19 NOTE — Telephone Encounter (Signed)
Spoke with pharmacist and advised that pt is to take 10Units QD, NOT 70Units. They have notated her script and will fill accordingly.   Spoke with pt and advised that she is to take 10Units daily and no more. She voiced understanding. Advised her to contact office if she has any questions or concerns.

## 2017-07-19 NOTE — Telephone Encounter (Signed)
Pharmacy is calling for refill on Toujeo. They state they have sent several refill requests with no response. They also need clarification on dose. Last rx states pt taking 10 units but pt states she is taking 70 units.  Dr. Elease Hashimoto - Please advise. Thanks!

## 2017-07-19 NOTE — Telephone Encounter (Signed)
Patient needs her Rx refilled  Insulin Glargine (TOUJEO SOLOSTAR) 300 UNIT/ML SOPN    Sent to:   [place], [place] [phone] (Phone) [phone] (Fax)   I gave her one sample

## 2017-07-26 ENCOUNTER — Other Ambulatory Visit: Payer: Self-pay | Admitting: Family Medicine

## 2017-07-28 ENCOUNTER — Other Ambulatory Visit: Payer: Self-pay | Admitting: *Deleted

## 2017-07-28 MED ORDER — INSULIN GLARGINE 300 UNIT/ML ~~LOC~~ SOPN: 50.0000 [IU] | PEN_INJECTOR | Freq: Every day | SUBCUTANEOUS | 5 refills | Status: DC

## 2017-08-09 ENCOUNTER — Other Ambulatory Visit: Payer: Self-pay | Admitting: Family Medicine

## 2017-08-11 ENCOUNTER — Encounter: Payer: Self-pay | Admitting: Family Medicine

## 2017-08-24 ENCOUNTER — Ambulatory Visit: Payer: Medicare HMO

## 2017-10-07 ENCOUNTER — Ambulatory Visit: Payer: Medicare HMO | Admitting: Family Medicine

## 2017-12-30 ENCOUNTER — Other Ambulatory Visit: Payer: Self-pay | Admitting: Family Medicine

## 2018-01-16 ENCOUNTER — Ambulatory Visit (INDEPENDENT_AMBULATORY_CARE_PROVIDER_SITE_OTHER): Payer: Medicare HMO | Admitting: Family Medicine

## 2018-01-16 ENCOUNTER — Encounter: Payer: Self-pay | Admitting: Family Medicine

## 2018-01-16 VITALS — BP 110/70 | HR 62 | Temp 99.4°F | Wt 189.6 lb

## 2018-01-16 DIAGNOSIS — E1140 Type 2 diabetes mellitus with diabetic neuropathy, unspecified: Secondary | ICD-10-CM | POA: Diagnosis not present

## 2018-01-16 DIAGNOSIS — E1165 Type 2 diabetes mellitus with hyperglycemia: Secondary | ICD-10-CM

## 2018-01-16 DIAGNOSIS — H6123 Impacted cerumen, bilateral: Secondary | ICD-10-CM | POA: Diagnosis not present

## 2018-01-16 DIAGNOSIS — E785 Hyperlipidemia, unspecified: Secondary | ICD-10-CM

## 2018-01-16 DIAGNOSIS — IMO0002 Reserved for concepts with insufficient information to code with codable children: Secondary | ICD-10-CM

## 2018-01-16 LAB — POCT GLYCOSYLATED HEMOGLOBIN (HGB A1C): Hemoglobin A1C: 7.7

## 2018-01-16 MED ORDER — METFORMIN HCL 500 MG PO TABS: ORAL_TABLET | ORAL | 1 refills | Status: DC

## 2018-01-16 MED ORDER — GLIMEPIRIDE 4 MG PO TABS: 4.0000 mg | ORAL_TABLET | Freq: Every morning | ORAL | 1 refills | Status: DC

## 2018-01-16 MED ORDER — FENOFIBRATE 160 MG PO TABS: 160.0000 mg | ORAL_TABLET | Freq: Every day | ORAL | 1 refills | Status: DC

## 2018-01-16 MED ORDER — INSULIN GLARGINE 300 UNIT/ML ~~LOC~~ SOPN: 50.0000 [IU] | PEN_INJECTOR | Freq: Every day | SUBCUTANEOUS | 5 refills | Status: DC

## 2018-01-16 MED ORDER — ROSUVASTATIN CALCIUM 20 MG PO TABS: 20.0000 mg | ORAL_TABLET | Freq: Every day | ORAL | 1 refills | Status: DC

## 2018-01-16 NOTE — Patient Instructions (Signed)
Tighten up diet Reduce sugars and starches. Let's plan on 3 month follow up.
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
## 2018-04-18 ENCOUNTER — Ambulatory Visit: Payer: Medicare HMO | Admitting: Family Medicine

## 2018-08-09 ENCOUNTER — Other Ambulatory Visit: Payer: Self-pay | Admitting: Family Medicine

## 2018-09-08 ENCOUNTER — Ambulatory Visit (INDEPENDENT_AMBULATORY_CARE_PROVIDER_SITE_OTHER): Payer: Medicare HMO | Admitting: Adult Health

## 2018-09-08 ENCOUNTER — Ambulatory Visit: Payer: Self-pay | Admitting: *Deleted

## 2018-09-08 ENCOUNTER — Encounter: Payer: Self-pay | Admitting: Adult Health

## 2018-09-08 VITALS — BP 110/74 | Temp 98.9°F | Wt 191.0 lb

## 2018-09-08 DIAGNOSIS — R079 Chest pain, unspecified: Secondary | ICD-10-CM

## 2018-09-08 DIAGNOSIS — H65111 Acute and subacute allergic otitis media (mucoid) (sanguinous) (serous), right ear: Secondary | ICD-10-CM

## 2018-09-08 DIAGNOSIS — H6121 Impacted cerumen, right ear: Secondary | ICD-10-CM

## 2018-09-08 MED ORDER — AMOXICILLIN-POT CLAVULANATE 875-125 MG PO TABS: 1.0000 | ORAL_TABLET | Freq: Two times a day (BID) | ORAL | 0 refills | Status: DC

## 2018-09-08 NOTE — Telephone Encounter (Signed)
pt called due to headache that started yesterday; it radiated to her neck; then her chest started hurting; the pt says that she is not short of breath; when she breathes in and out her throat feels raw; she also says that her right ear hurts; the pt says her headache, earache, and her chest discomfort are now gone; she says that she would like to be seen for evaluation of these symptoms and to get refills on her medication  Reason for Disposition . [1] MODERATE longstanding difficulty breathing (e.g., speaks in phrases, SOB even at rest, pulse 100-120) AND [2] SAME as normal  Answer Assessment - Initial Assessment Questions 1. RESPIRATORY STATUS: "Describe your breathing?" (e.g., wheezing, shortness of breath, unable to speak, severe coughing)      Chest tightness and felt raw on 09/07/18 2. ONSET: "When did this breathing problem begin?"    09/07/18 at 1500; lasted about 2 hours 3. PATTERN "Does the difficult breathing come and go, or has it been constant since it started?"     intermittent 4. SEVERITY: "How bad is your breathing?" (e.g., mild, moderate, severe)    - MILD: No SOB at rest, mild SOB with walking, speaks normally in sentences, can lay down, no retractions, pulse < 100.    - MODERATE: SOB at rest, SOB with minimal exertion and prefers to sit, cannot lie down flat, speaks in phrases, mild retractions, audible wheezing, pulse 100-120.    - SEVERE: Very SOB at rest, speaks in single words, struggling to breathe, sitting hunched forward, retractions, pulse > 120      mild 5. RECURRENT SYMPTOM: "Have you had difficulty breathing before?" If so, ask: "When was the last time?" and "What happened that time?"      Yes, asthma and allergies; usually bad in October 6. CARDIAC HISTORY: "Do you have any history of heart disease?" (e.g., heart attack, angina, bypass surgery, angioplasty)      no 7. LUNG HISTORY: "Do you have any history of lung disease?"  (e.g., pulmonary embolus, asthma,  emphysema)     asthma 8. CAUSE: "What do you think is causing the breathing problem?"      Seasonal allergies 9. OTHER SYMPTOMS: "Do you have any other symptoms? (e.g., dizziness, runny nose, cough, chest pain, fever)     Sore throat, right ear pain, runny nose 10. PREGNANCY: "Is there any chance you are pregnant?" "When was your last menstrual period?"       no 11. TRAVEL: "Have you traveled out of the country in the last month?" (e.g., travel history, exposures)       no  Protocols used: BREATHING DIFFICULTY-A-AH

## 2018-09-08 NOTE — Telephone Encounter (Signed)
Spoke with [facility] regarding scheduling the pt; she requests that the pt be scheduled for office visit for symptoms that occurred 09/07/18 and that a message be sent to the office for the pt to receive her refills; contacted to schedule appointment; pt offered and accepted appointment with Dorothyann Peng, [facility], 09/08/18 at 1115; she is normally seen by Dr Elease Hashimoto but he has no availability today.
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
## 2018-09-13 ENCOUNTER — Other Ambulatory Visit: Payer: Self-pay

## 2018-09-13 ENCOUNTER — Ambulatory Visit (INDEPENDENT_AMBULATORY_CARE_PROVIDER_SITE_OTHER): Payer: Medicare HMO | Admitting: Family Medicine

## 2018-09-13 ENCOUNTER — Encounter: Payer: Self-pay | Admitting: Family Medicine

## 2018-09-13 VITALS — BP 118/72 | HR 81 | Temp 98.3°F | Wt 191.9 lb

## 2018-09-13 DIAGNOSIS — E1165 Type 2 diabetes mellitus with hyperglycemia: Secondary | ICD-10-CM | POA: Diagnosis not present

## 2018-09-13 DIAGNOSIS — E119 Type 2 diabetes mellitus without complications: Secondary | ICD-10-CM | POA: Diagnosis not present

## 2018-09-13 DIAGNOSIS — Z23 Encounter for immunization: Secondary | ICD-10-CM

## 2018-09-13 DIAGNOSIS — I10 Essential (primary) hypertension: Secondary | ICD-10-CM | POA: Diagnosis not present

## 2018-09-13 DIAGNOSIS — E669 Obesity, unspecified: Secondary | ICD-10-CM

## 2018-09-13 DIAGNOSIS — IMO0002 Reserved for concepts with insufficient information to code with codable children: Secondary | ICD-10-CM

## 2018-09-13 DIAGNOSIS — E1140 Type 2 diabetes mellitus with diabetic neuropathy, unspecified: Secondary | ICD-10-CM

## 2018-09-13 DIAGNOSIS — E785 Hyperlipidemia, unspecified: Secondary | ICD-10-CM | POA: Diagnosis not present

## 2018-09-13 LAB — POCT GLYCOSYLATED HEMOGLOBIN (HGB A1C): Hemoglobin A1C: 7.2 % — AB (ref 4.0–5.6)

## 2018-09-13 NOTE — Patient Instructions (Signed)
Try to lose some weight  Let's plan on 6 month follow up  Your A1C today is improved to 7.2%.
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
## 2018-09-14 LAB — BASIC METABOLIC PANEL
BUN: 16 mg/dL (ref 6–23)
CALCIUM: 9.6 mg/dL (ref 8.4–10.5)
CO2: 30 mEq/L (ref 19–32)
Chloride: 100 mEq/L (ref 96–112)
Creatinine, Ser: 0.8 mg/dL (ref 0.40–1.20)
GFR: 74.88 mL/min (ref >60.00)
Glucose, Bld: 280 mg/dL — ABNORMAL HIGH (ref 70–99)
POTASSIUM: 4.5 meq/L (ref 3.5–5.1)
SODIUM: 139 meq/L (ref 135–145)

## 2018-09-14 LAB — HEPATIC FUNCTION PANEL
ALBUMIN: 4 g/dL (ref 3.5–5.2)
ALT: 19 U/L (ref 0–35)
AST: 22 U/L (ref 0–37)
Alkaline Phosphatase: 36 U/L — ABNORMAL LOW (ref 39–117)
Bilirubin, Direct: 0.1 mg/dL (ref 0.0–0.3)
TOTAL PROTEIN: 6.3 g/dL (ref 6.0–8.3)
Total Bilirubin: 0.3 mg/dL (ref 0.2–1.2)

## 2018-09-14 LAB — LIPID PANEL
CHOL/HDL RATIO: 5
CHOLESTEROL: 138 mg/dL (ref 0–200)
HDL: 29.1 mg/dL — AB (ref >39.00)

## 2018-09-14 LAB — MICROALBUMIN / CREATININE URINE RATIO
Creatinine,U: 68.1 mg/dL
MICROALB/CREAT RATIO: 1 mg/g (ref 0.0–30.0)

## 2018-09-14 LAB — LDL CHOLESTEROL, DIRECT: LDL DIRECT: 60 mg/dL

## 2018-09-15 ENCOUNTER — Encounter: Payer: Self-pay | Admitting: Family Medicine

## 2018-09-19 ENCOUNTER — Other Ambulatory Visit: Payer: Self-pay | Admitting: Family Medicine

## 2018-11-01 ENCOUNTER — Other Ambulatory Visit: Payer: Self-pay | Admitting: Family Medicine

## 2018-11-08 ENCOUNTER — Ambulatory Visit: Payer: Medicare HMO | Admitting: Family Medicine

## 2018-11-09 ENCOUNTER — Ambulatory Visit (INDEPENDENT_AMBULATORY_CARE_PROVIDER_SITE_OTHER): Payer: Medicare HMO | Admitting: Family Medicine

## 2018-11-09 ENCOUNTER — Encounter: Payer: Self-pay | Admitting: Family Medicine

## 2018-11-09 VITALS — BP 100/62 | HR 92 | Temp 99.4°F | Ht 62.0 in | Wt 187.2 lb

## 2018-11-09 DIAGNOSIS — R1032 Left lower quadrant pain: Secondary | ICD-10-CM | POA: Diagnosis not present

## 2018-11-09 LAB — COMPREHENSIVE METABOLIC PANEL
ALT: 9 U/L (ref 0–35)
AST: 12 U/L (ref 0–37)
Albumin: 4 g/dL (ref 3.5–5.2)
Alkaline Phosphatase: 33 U/L — ABNORMAL LOW (ref 39–117)
BUN: 13 mg/dL (ref 6–23)
CHLORIDE: 100 meq/L (ref 96–112)
CO2: 28 mEq/L (ref 19–32)
CREATININE: 0.72 mg/dL (ref 0.40–1.20)
Calcium: 9.4 mg/dL (ref 8.4–10.5)
GFR: 84.52 mL/min (ref >60.00)
GLUCOSE: 195 mg/dL — AB (ref 70–99)
POTASSIUM: 4.5 meq/L (ref 3.5–5.1)
SODIUM: 138 meq/L (ref 135–145)
TOTAL PROTEIN: 6.5 g/dL (ref 6.0–8.3)
Total Bilirubin: 0.7 mg/dL (ref 0.2–1.2)

## 2018-11-09 LAB — POCT URINALYSIS DIPSTICK
Bilirubin, UA: NEGATIVE
Glucose, UA: NEGATIVE
Ketones, UA: NEGATIVE
Nitrite, UA: NEGATIVE
PH UA: 5 (ref 5.0–8.0)
Protein, UA: NEGATIVE
RBC UA: NEGATIVE
SPEC GRAV UA: 1.02 (ref 1.010–1.025)
UROBILINOGEN UA: 0.2 U/dL

## 2018-11-09 LAB — CBC
HCT: 36.1 % (ref 36.0–46.0)
Hemoglobin: 12.4 g/dL (ref 12.0–15.0)
MCHC: 34.3 g/dL (ref 30.0–36.0)
MCV: 89.5 fl (ref 78.0–100.0)
Platelets: 316 10*3/uL (ref 150.0–400.0)
RBC: 4.03 Mil/uL (ref 3.87–5.11)
RDW: 12.9 % (ref 11.5–15.5)
WBC: 11.2 10*3/uL — AB (ref 4.0–10.5)

## 2018-11-09 NOTE — Patient Instructions (Signed)
BEFORE YOU LEAVE: -udip with reflex  -labs -follow up:  Recheck tomorrow or Monday with PCP per  No dairy or red meat  Liquid and pureed bland diet  Plenty of water  I hope you are feeling better soon! Seek care promptly if your symptoms worsen, new concerns arise or you are not improving with treatment.
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
## 2018-11-09 NOTE — Addendum Note (Signed)
Addended by: Lahoma Crocker A on: 11/09/2018 11:13 AM   Modules accepted: Orders

## 2018-11-10 LAB — URINE CULTURE
MICRO NUMBER:: 91520871
SPECIMEN QUALITY:: ADEQUATE

## 2018-11-17 ENCOUNTER — Ambulatory Visit (INDEPENDENT_AMBULATORY_CARE_PROVIDER_SITE_OTHER): Payer: Medicare HMO | Admitting: Family Medicine

## 2018-11-17 ENCOUNTER — Encounter: Payer: Self-pay | Admitting: Family Medicine

## 2018-11-17 ENCOUNTER — Other Ambulatory Visit: Payer: Self-pay

## 2018-11-17 VITALS — BP 122/86 | HR 92 | Temp 98.5°F | Ht 62.0 in | Wt 185.0 lb

## 2018-11-17 DIAGNOSIS — L858 Other specified epidermal thickening: Secondary | ICD-10-CM | POA: Diagnosis not present

## 2018-11-17 DIAGNOSIS — R1032 Left lower quadrant pain: Secondary | ICD-10-CM | POA: Diagnosis not present

## 2018-11-17 DIAGNOSIS — H9201 Otalgia, right ear: Secondary | ICD-10-CM
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
## 2019-01-17 ENCOUNTER — Other Ambulatory Visit: Payer: Self-pay | Admitting: Family Medicine

## 2019-02-13 ENCOUNTER — Ambulatory Visit: Payer: Medicare HMO | Admitting: Family Medicine

## 2019-02-28 ENCOUNTER — Other Ambulatory Visit: Payer: Self-pay | Admitting: Family Medicine

## 2019-02-28 NOTE — Telephone Encounter (Signed)
Called patient and she has a telephone only visit on 03/01/19.

## 2019-03-01 ENCOUNTER — Ambulatory Visit (INDEPENDENT_AMBULATORY_CARE_PROVIDER_SITE_OTHER): Payer: Medicare HMO | Admitting: Family Medicine

## 2019-03-01 ENCOUNTER — Other Ambulatory Visit: Payer: Self-pay

## 2019-03-01 DIAGNOSIS — E1165 Type 2 diabetes mellitus with hyperglycemia: Secondary | ICD-10-CM | POA: Diagnosis not present

## 2019-03-01 DIAGNOSIS — E785 Hyperlipidemia, unspecified: Secondary | ICD-10-CM | POA: Diagnosis not present

## 2019-03-01 DIAGNOSIS — IMO0002 Reserved for concepts with insufficient information to code with codable children: Secondary | ICD-10-CM

## 2019-03-01 DIAGNOSIS — E119 Type 2 diabetes mellitus without complications: Secondary | ICD-10-CM | POA: Diagnosis not present

## 2019-03-01 DIAGNOSIS — I10 Essential (primary) hypertension: Secondary | ICD-10-CM | POA: Diagnosis not present

## 2019-03-01 DIAGNOSIS — E1140 Type 2 diabetes mellitus with diabetic neuropathy, unspecified: Secondary | ICD-10-CM | POA: Diagnosis not present

## 2019-03-01 DIAGNOSIS — E669 Obesity, unspecified: Secondary | ICD-10-CM | POA: Diagnosis not present

## 2019-03-01 NOTE — Telephone Encounter (Signed)
Yes  Same dose
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
## 2019-03-01 NOTE — Telephone Encounter (Signed)
OK to keep same dosage for patient?

## 2019-03-08 ENCOUNTER — Other Ambulatory Visit: Payer: Self-pay | Admitting: Family Medicine

## 2019-04-10 ENCOUNTER — Other Ambulatory Visit: Payer: Self-pay | Admitting: Family Medicine

## 2019-06-04 ENCOUNTER — Other Ambulatory Visit: Payer: Self-pay

## 2019-06-04 ENCOUNTER — Encounter: Payer: Self-pay | Admitting: Family Medicine

## 2019-06-04 ENCOUNTER — Ambulatory Visit (INDEPENDENT_AMBULATORY_CARE_PROVIDER_SITE_OTHER): Payer: Medicare HMO | Admitting: Family Medicine

## 2019-06-04 DIAGNOSIS — R059 Cough, unspecified: Secondary | ICD-10-CM

## 2019-06-04 DIAGNOSIS — R05 Cough: Secondary | ICD-10-CM | POA: Diagnosis not present

## 2019-06-04 MED ORDER — DOXYCYCLINE HYCLATE 100 MG PO CAPS: 100.0000 mg | ORAL_CAPSULE | Freq: Two times a day (BID) | ORAL | 0 refills | Status: DC

## 2019-06-04 NOTE — Progress Notes (Signed)
Patient ID: Mary Caldwell, female   DOB: 11-16-1946, 73 y.o.   MRN: [id number]  This visit type was conducted due to national recommendations for restrictions regarding the COVID-19 pandemic in an effort to limit this patient's exposure and mitigate transmission in our community.   Virtual Visit via Telephone Note  I connected with Bonner Puna on 06/04/19 at  4:30 PM EDT by telephone and verified that I am speaking with the correct person using two identifiers.   I discussed the limitations, risks, security and privacy concerns of performing an evaluation and management service by telephone and the availability of in person appointments. I also discussed with the patient that there may be a patient responsible charge related to this service. The patient expressed understanding and agreed to proceed.  Location patient: home Location provider: work or home office Participants present for the call: patient, provider Patient did not have a visit in the prior 7 days to address this/these issue(s).   History of Present Illness: Patient is non-smoker with history of chronic problems including obesity, hypertension, type 2 diabetes, hyperlipidemia.  She relates about 1 week history of some cough.  She thought this was initially more allergy related.  Denies any fever.  No dyspnea.  Cough productive of thick green sputum.  Denies any facial pain.  She has had some fullness in her left ear and occasionally right ear.  No loss of taste or smell.  No sore throat.  No malaise.  No myalgias.  She has been relatively isolated.  Is getting groceries.  No known sick contacts.   Observations/Objective: Patient sounds cheerful and well on the phone. I do not appreciate any SOB. Speech and thought processing are grossly intact. Patient reported vitals:  Assessment and Plan:  1 week history of productive cough.  She does not have any red flags such as fever, dyspnea, etc.  Suspect acute bronchitis.   Moderately high risk for complications  -We discussed low threshold to test for coronavirus if she has any fever or shortness of breath but patient declines at this time -We have recommended isolation until symptoms fully resolved -Start doxycycline 100 mg twice daily for 10 days -Recommend 1 month in office follow-up for evaluation.  She is overdue for some labs  Follow Up Instructions:  -1 month for an office evaluation   [id number] [id number] [id number] 11-20 [id number] 21-30 I did not refer this patient for an OV in the next 24 hours for this/these issue(s).  I discussed the assessment and treatment plan with the patient. The patient was provided an opportunity to ask questions and all were answered. The patient agreed with the plan and demonstrated an understanding of the instructions.   The patient was advised to call back or seek an in-person evaluation if the symptoms worsen or if the condition fails to improve as anticipated.  I provided 15 minutes of non-face-to-face time during this encounter.   Carolann Littler, MD

## 2019-06-05 ENCOUNTER — Telehealth: Payer: Self-pay | Admitting: Family Medicine

## 2019-06-05 NOTE — Telephone Encounter (Signed)
Please see message. °

## 2019-06-05 NOTE — Telephone Encounter (Signed)
Pt called stating the doxycycline that was sent in for her yesterday is making her vomit. Pt is requesting another antibiotic be sent in for her. Please advise.   [facility] [address], [address]  [place] [address]  Phone: [phone] Fax: [phone]  Not a 24 hour pharmacy; exact hours not known.

## 2019-06-05 NOTE — Telephone Encounter (Signed)
Change to Ceftin 250 mg po bid for 7 days and take with food.

## 2019-06-06 ENCOUNTER — Other Ambulatory Visit: Payer: Self-pay

## 2019-06-06 MED ORDER — CEFUROXIME AXETIL 250 MG PO TABS: 250.0000 mg | ORAL_TABLET | Freq: Two times a day (BID) | ORAL | 0 refills | Status: DC

## 2019-06-06 NOTE — Telephone Encounter (Signed)
Called patient and LMOVM to return call  Lemay for [facility] to Discuss results / PCP / recommendations / Schedule patient  Per Dr. Elease Hashimoto: Change to Ceftin 250 mg po bid for 7 days and take with food.  I left a detailed voice message for patient to stop taking the Doxycycline and let her know that I have sent the new Rx listed above to the [facility] for her.  CRM Created.

## 2019-06-06 NOTE — Telephone Encounter (Signed)
Pt is calling back and would like callback concerning the below message

## 2019-06-08 ENCOUNTER — Other Ambulatory Visit: Payer: Self-pay

## 2019-06-08 ENCOUNTER — Emergency Department (HOSPITAL_BASED_OUTPATIENT_CLINIC_OR_DEPARTMENT_OTHER)
Admission: EM | Admit: 2019-06-08 | Discharge: 2019-06-08 | Disposition: A | Discharge status: Home / Self Care | Payer: Medicare HMO | Attending: Emergency Medicine | Admitting: Emergency Medicine

## 2019-06-08 ENCOUNTER — Emergency Department (HOSPITAL_BASED_OUTPATIENT_CLINIC_OR_DEPARTMENT_OTHER): Payer: Medicare HMO

## 2019-06-08 ENCOUNTER — Encounter (HOSPITAL_BASED_OUTPATIENT_CLINIC_OR_DEPARTMENT_OTHER): Payer: Self-pay | Admitting: Emergency Medicine

## 2019-06-08 DIAGNOSIS — J209 Acute bronchitis, unspecified: Secondary | ICD-10-CM

## 2019-06-08 DIAGNOSIS — Z79899 Other long term (current) drug therapy: Secondary | ICD-10-CM | POA: Diagnosis not present

## 2019-06-08 DIAGNOSIS — I10 Essential (primary) hypertension: Secondary | ICD-10-CM | POA: Diagnosis not present

## 2019-06-08 DIAGNOSIS — J45909 Unspecified asthma, uncomplicated: Secondary | ICD-10-CM | POA: Diagnosis not present

## 2019-06-08 DIAGNOSIS — Z7982 Long term (current) use of aspirin: Secondary | ICD-10-CM | POA: Diagnosis not present

## 2019-06-08 DIAGNOSIS — R05 Cough: Secondary | ICD-10-CM | POA: Diagnosis not present

## 2019-06-08 DIAGNOSIS — E1140 Type 2 diabetes mellitus with diabetic neuropathy, unspecified: Secondary | ICD-10-CM | POA: Insufficient documentation

## 2019-06-08 LAB — COMPREHENSIVE METABOLIC PANEL
ALT: 17 U/L (ref 0–44)
AST: 20 U/L (ref 15–41)
Albumin: 3.8 g/dL (ref 3.5–5.0)
Alkaline Phosphatase: 29 U/L — ABNORMAL LOW (ref 38–126)
Anion gap: 12 (ref 5–15)
BUN: 15 mg/dL (ref 8–23)
CO2: 22 mmol/L (ref 22–32)
Calcium: 9.3 mg/dL (ref 8.9–10.3)
Chloride: 103 mmol/L (ref 98–111)
Creatinine, Ser: 0.72 mg/dL (ref 0.44–1.00)
GFR calc Af Amer: >60 mL/min (ref >60)
GFR calc non Af Amer: >60 mL/min (ref >60)
Glucose, Bld: 187 mg/dL — ABNORMAL HIGH (ref 70–99)
Potassium: 3.8 mmol/L (ref 3.5–5.1)
Sodium: 137 mmol/L (ref 135–145)
Total Bilirubin: 0.6 mg/dL (ref 0.3–1.2)
Total Protein: 6.6 g/dL (ref 6.5–8.1)

## 2019-06-08 LAB — CBC WITH DIFFERENTIAL/PLATELET
Abs Immature Granulocytes: 0.03 10*3/uL (ref 0.00–0.07)
Basophils Absolute: 0 10*3/uL (ref 0.0–0.1)
Basophils Relative: 1 %
Eosinophils Absolute: 0.3 10*3/uL (ref 0.0–0.5)
Eosinophils Relative: 4 %
HCT: 36.7 % (ref 36.0–46.0)
Hemoglobin: 12.3 g/dL (ref 12.0–15.0)
Immature Granulocytes: 0 %
Lymphocytes Relative: 31 %
Lymphs Abs: 2.4 10*3/uL (ref 0.7–4.0)
MCH: 30.7 pg (ref 26.0–34.0)
MCHC: 33.5 g/dL (ref 30.0–36.0)
MCV: 91.5 fL (ref 80.0–100.0)
Monocytes Absolute: 0.5 10*3/uL (ref 0.1–1.0)
Monocytes Relative: 7 %
Neutro Abs: 4.3 10*3/uL (ref 1.7–7.7)
Neutrophils Relative %: 57 %
Platelets: 256 10*3/uL (ref 150–400)
RBC: 4.01 MIL/uL (ref 3.87–5.11)
RDW: 12.5 % (ref 11.5–15.5)
WBC: 7.5 10*3/uL (ref 4.0–10.5)
nRBC: 0 % (ref 0.0–0.2)

## 2019-06-08 LAB — TROPONIN I (HIGH SENSITIVITY)
Troponin I (High Sensitivity): <2 ng/L (ref <18)
Troponin I (High Sensitivity): <2 ng/L (ref <18)

## 2019-06-08 MED ORDER — PREDNISONE 50 MG PO TABS
60.0000 mg | ORAL_TABLET | Freq: Once | ORAL | Status: AC
  Administered 2019-06-08: 60 mg via ORAL
  Filled 2019-06-08: qty 1

## 2019-06-08 MED ORDER — PREDNISONE 20 MG PO TABS: ORAL_TABLET | ORAL | 0 refills | Status: DC

## 2019-06-08 MED ORDER — ALBUTEROL SULFATE HFA 108 (90 BASE) MCG/ACT IN AERS: 2.0000 | INHALATION_SPRAY | RESPIRATORY_TRACT | 0 refills | Status: DC | PRN

## 2019-06-08 MED ORDER — ASPIRIN 81 MG PO CHEW
324.0000 mg | CHEWABLE_TABLET | Freq: Once | ORAL | Status: AC
  Administered 2019-06-08: 324 mg via ORAL
  Filled 2019-06-08: qty 4

## 2019-06-08 NOTE — Discharge Instructions (Signed)
Continue taking your antibiotic.  Please monitor your blood sugar while taking prednisone - it can make your blood sugar go higher.
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
## 2019-06-08 NOTE — ED Triage Notes (Signed)
Pt c/o cough x 1 week and has been dx with bronchitis via tele-visit. Pt here tonight due to pressure in chest radiating to back tonight.

## 2019-06-19 ENCOUNTER — Telehealth: Payer: Self-pay | Admitting: Family Medicine

## 2019-06-19 NOTE — Telephone Encounter (Signed)
LVM for patient to call off to make appt for ED f/u and stopped up ears.

## 2019-06-24 DIAGNOSIS — H60501 Unspecified acute noninfective otitis externa, right ear: Secondary | ICD-10-CM | POA: Diagnosis not present

## 2019-07-11 ENCOUNTER — Other Ambulatory Visit: Payer: Self-pay | Admitting: Family Medicine

## 2019-07-25 ENCOUNTER — Telehealth: Payer: Self-pay

## 2019-07-25 NOTE — Telephone Encounter (Signed)
Called patient and left a detailed voice message to let her know that she needs to schedule an in office follow up visit with Dr. Elease Hashimoto for labs and follow up.  OK for [facility] to discuss/advise/schedule  CRM Created.

## 2019-07-31 ENCOUNTER — Other Ambulatory Visit: Payer: Self-pay | Admitting: Family Medicine

## 2019-08-13 ENCOUNTER — Encounter: Payer: Self-pay | Admitting: Family Medicine

## 2019-08-13 ENCOUNTER — Ambulatory Visit (INDEPENDENT_AMBULATORY_CARE_PROVIDER_SITE_OTHER): Payer: Medicare HMO | Admitting: Family Medicine

## 2019-08-13 ENCOUNTER — Other Ambulatory Visit: Payer: Self-pay

## 2019-08-13 VITALS — BP 138/82 | HR 112 | Temp 97.2°F | Ht 62.0 in | Wt 184.1 lb

## 2019-08-13 DIAGNOSIS — I75021 Atheroembolism of right lower extremity: Secondary | ICD-10-CM | POA: Diagnosis not present

## 2019-08-13 LAB — POCT URINALYSIS DIPSTICK
Bilirubin, UA: NEGATIVE
Blood, UA: POSITIVE
Glucose, UA: NEGATIVE
Ketones, UA: NEGATIVE
Nitrite, UA: NEGATIVE
Protein, UA: POSITIVE — AB
Spec Grav, UA: 1.02 (ref 1.010–1.025)
Urobilinogen, UA: 0.2 E.U./dL
pH, UA: 6 (ref 5.0–8.0)

## 2019-08-13 NOTE — Patient Instructions (Signed)
Set up 2 day follow up- 30 minute appt.  Follow up immediately for any progressive toe or foot discoloration or worsening pain.
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
## 2019-08-14 ENCOUNTER — Telehealth: Payer: Self-pay

## 2019-08-14 NOTE — Telephone Encounter (Signed)
I accidentally ordered a POCT UA for this patient and this was the wrong patient for this order. I have contacted my supervisor Roselyn Reef and was advised to put in a Safety Zone Portal and I will check with Vita Barley to make sure this patient is not charged for this test.

## 2019-08-15 ENCOUNTER — Ambulatory Visit (INDEPENDENT_AMBULATORY_CARE_PROVIDER_SITE_OTHER): Payer: Medicare HMO | Admitting: Family Medicine

## 2019-08-15 ENCOUNTER — Encounter: Payer: Self-pay | Admitting: Family Medicine

## 2019-08-15 ENCOUNTER — Other Ambulatory Visit: Payer: Self-pay

## 2019-08-15 VITALS — BP 120/68 | HR 97 | Temp 98.9°F | Ht 62.0 in | Wt 185.7 lb

## 2019-08-15 DIAGNOSIS — E785 Hyperlipidemia, unspecified: Secondary | ICD-10-CM | POA: Diagnosis not present

## 2019-08-15 DIAGNOSIS — I10 Essential (primary) hypertension: Secondary | ICD-10-CM | POA: Diagnosis not present

## 2019-08-15 DIAGNOSIS — I75021 Atheroembolism of right lower extremity: Secondary | ICD-10-CM | POA: Diagnosis not present

## 2019-08-15 DIAGNOSIS — E1165 Type 2 diabetes mellitus with hyperglycemia: Secondary | ICD-10-CM

## 2019-08-15 DIAGNOSIS — R300 Dysuria: Secondary | ICD-10-CM | POA: Diagnosis not present

## 2019-08-15 DIAGNOSIS — E1140 Type 2 diabetes mellitus with diabetic neuropathy, unspecified: Secondary | ICD-10-CM

## 2019-08-15 DIAGNOSIS — IMO0002 Reserved for concepts with insufficient information to code with codable children: Secondary | ICD-10-CM

## 2019-08-15 LAB — POCT URINALYSIS DIPSTICK
Bilirubin, UA: NEGATIVE
Blood, UA: NEGATIVE
Glucose, UA: POSITIVE — AB
Ketones, UA: NEGATIVE
Leukocytes, UA: NEGATIVE
Nitrite, UA: NEGATIVE
Protein, UA: POSITIVE — AB
Spec Grav, UA: 1.02 (ref 1.010–1.025)
Urobilinogen, UA: 1 E.U./dL
pH, UA: 6 (ref 5.0–8.0)

## 2019-08-15 LAB — LIPID PANEL
Cholesterol: 138 mg/dL (ref 0–200)
HDL: 30.1 mg/dL — ABNORMAL LOW (ref >39.00)
Total CHOL/HDL Ratio: 5
Triglycerides: 557 mg/dL — ABNORMAL HIGH (ref 0.0–149.0)

## 2019-08-15 LAB — HEPATIC FUNCTION PANEL
ALT: 12 U/L (ref 0–35)
AST: 16 U/L (ref 0–37)
Albumin: 4.1 g/dL (ref 3.5–5.2)
Alkaline Phosphatase: 32 U/L — ABNORMAL LOW (ref 39–117)
Bilirubin, Direct: 0.1 mg/dL (ref 0.0–0.3)
Total Bilirubin: 0.4 mg/dL (ref 0.2–1.2)
Total Protein: 6.2 g/dL (ref 6.0–8.3)

## 2019-08-15 LAB — BASIC METABOLIC PANEL
BUN: 17 mg/dL (ref 6–23)
CO2: 28 mEq/L (ref 19–32)
Calcium: 9.9 mg/dL (ref 8.4–10.5)
Chloride: 99 mEq/L (ref 96–112)
Creatinine, Ser: 0.68 mg/dL (ref 0.40–1.20)
GFR: 84.77 mL/min (ref >60.00)
Glucose, Bld: 250 mg/dL — ABNORMAL HIGH (ref 70–99)
Potassium: 3.9 mEq/L (ref 3.5–5.1)
Sodium: 136 mEq/L (ref 135–145)

## 2019-08-15 LAB — LDL CHOLESTEROL, DIRECT: Direct LDL: 51 mg/dL

## 2019-08-15 LAB — HEMOGLOBIN A1C: Hgb A1c MFr Bld: 7.9 % — ABNORMAL HIGH (ref 4.6–6.5)

## 2019-08-15 NOTE — Patient Instructions (Signed)
We are setting up referral to Vein and Vascular Surgery  Follow up immediately for any foot pain, ulceration, cold foot, or other concerns.  Start back 81 mg aspirin daily.
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
## 2019-08-16 ENCOUNTER — Telehealth: Payer: Self-pay | Admitting: Family Medicine

## 2019-08-20 ENCOUNTER — Other Ambulatory Visit: Payer: Self-pay

## 2019-08-20 DIAGNOSIS — L819 Disorder of pigmentation, unspecified: Secondary | ICD-10-CM

## 2019-08-20 NOTE — Telephone Encounter (Signed)
The above patient or their representative was contacted and gave the following answers to these questions:         Do you have any of the following symptoms?n  Fever                    Cough                   Shortness of breath  Do  you have any of the following other symptoms? n   muscle pain         vomiting,        diarrhea        rash         weakness        red eye        abdominal pain         bruising          bruising or bleeding              joint pain           severe headache    Have you been in contact with someone who was or has been sick in the past 2 weeks?n Yes                 Unsure                         Unable to assess   Does the person that you were in contact with have any of the following symptoms?   Cough         shortness of breath           muscle pain         vomiting,            diarrhea            rash            weakness           fever            red eye           abdominal pain           bruising  or  bleeding                joint pain                severe headache               Have you  or someone you have been in contact with traveled internationally in th last month?         If yes, which countries?   Have you  or someone you have been in contact with traveled outside [place] in th last month?         If yes, which state and city?   COMMENTS OR ACTION PLAN FOR THIS PATIENT:         qu 

## 2019-08-20 NOTE — Telephone Encounter (Signed)
Patient is needing a medication refill on all of her medications. She was seen on September 23 by Burchette and the medication was never sent in. So she came into the office today requesting to have them sent in today.

## 2019-08-21 ENCOUNTER — Other Ambulatory Visit: Payer: Self-pay | Admitting: Vascular Surgery

## 2019-08-21 ENCOUNTER — Ambulatory Visit: Payer: Medicare HMO | Admitting: Vascular Surgery

## 2019-08-21 ENCOUNTER — Other Ambulatory Visit: Payer: Self-pay

## 2019-08-21 ENCOUNTER — Ambulatory Visit (HOSPITAL_COMMUNITY)
Admission: RE | Admit: 2019-08-21 | Discharge: 2019-08-21 | Disposition: A | Discharge status: Home / Self Care | Payer: Medicare HMO | Source: Ambulatory Visit | Attending: Family | Admitting: Family

## 2019-08-21 ENCOUNTER — Encounter: Payer: Self-pay | Admitting: Vascular Surgery

## 2019-08-21 VITALS — BP 134/67 | HR 78 | Temp 97.9°F | Resp 20 | Ht 62.0 in | Wt 184.0 lb

## 2019-08-21 DIAGNOSIS — L819 Disorder of pigmentation, unspecified: Secondary | ICD-10-CM

## 2019-08-21 DIAGNOSIS — I75021 Atheroembolism of right lower extremity: Secondary | ICD-10-CM | POA: Diagnosis not present

## 2019-08-21 MED ORDER — FENOFIBRATE 160 MG PO TABS: 160.0000 mg | ORAL_TABLET | Freq: Every day | ORAL | 1 refills | Status: DC

## 2019-08-21 MED ORDER — GLIMEPIRIDE 4 MG PO TABS: 4.0000 mg | ORAL_TABLET | Freq: Every morning | ORAL | 1 refills | Status: DC

## 2019-08-21 MED ORDER — METFORMIN HCL 500 MG PO TABS: ORAL_TABLET | ORAL | 1 refills | Status: DC

## 2019-08-21 MED ORDER — ROSUVASTATIN CALCIUM 20 MG PO TABS: 20.0000 mg | ORAL_TABLET | Freq: Every day | ORAL | 2 refills | Status: DC

## 2019-08-21 NOTE — Progress Notes (Signed)
Vascular and Vein Specialist of [facility]  Patient name: Mary Caldwell MRN: [id number] DOB: 12-24-1945 Sex: female  REASON FOR CONSULT: Blue toe syndrome  HPI: Mary Caldwell is a 73 y.o. female, who is patient reports that 1 week ago she had sudden onset of pain and cyanotic discoloration in her right great toe.  She saw Dr.Burchette who was concern regarding possible embolic phenomenon and is referred to [place] for further discussion.  She does not have any prior history of peripheral vascular occlusive disease.  She does have history of hypertension and diabetes.  She is concerned because her husband had gangrenous changes of the toe and had some type of amputation and died soon thereafter.  She reports that the discomfort is completely resolved she does have a little discoloration under the great nail bed.  Past Medical History:  Diagnosis Date  . ABDOMINAL ABSCESS 12/29/2009  . ASTHMA 02/11/2009  . DIABETES-TYPE 2 02/11/2009  . ESSENTIAL HYPERTENSION 02/11/2009  . HYPERLIPIDEMIA 02/11/2009  . OBESITY 02/11/2009    Family History  Problem Relation Age of Onset  . Arthritis Other   . Heart disease Other        grandparent  . Diabetes Other        parent  . Colon cancer Sister     SOCIAL HISTORY: Social History   Socioeconomic History  . Marital status: Married    Spouse name: Not on file  . Number of children: Not on file  . Years of education: Not on file  . Highest education level: Not on file  Occupational History  . Not on file  Social Needs  . Financial resource strain: Not on file  . Food insecurity    Worry: Not on file    Inability: Not on file  . Transportation needs    Medical: Not on file    Non-medical: Not on file  Tobacco Use  . Smoking status: Never Smoker  . Smokeless tobacco: Never Used  Substance and Sexual Activity  . Alcohol use: Yes    Alcohol/week: 0.0 standard drinks    Comment: only on rare holidays  . Drug  use: No  . Sexual activity: Not on file  Lifestyle  . Physical activity    Days per week: Not on file    Minutes per session: Not on file  . Stress: Not on file  Relationships  . Social Herbalist on phone: Not on file    Gets together: Not on file    Attends religious service: Not on file    Active member of club or organization: Not on file    Attends meetings of clubs or organizations: Not on file    Relationship status: Not on file  . Intimate partner violence    Fear of current or ex partner: Not on file    Emotionally abused: Not on file    Physically abused: Not on file    Forced sexual activity: Not on file  Other Topics Concern  . Not on file  Social History Narrative  . Not on file    Allergies  Allergen Reactions  . Atorvastatin     Does not remember  . Codeine Sulfate Nausea Only    GI upset    Current Outpatient Medications  Medication Sig Dispense Refill  . albuterol (VENTOLIN HFA) 108 (90 Base) MCG/ACT inhaler Inhale 2 puffs into the lungs every 4 (four) hours as needed for wheezing or shortness of  breath (or coughing). 18 g 0  . aspirin EC 81 MG tablet Take 81 mg by mouth daily.    . cefUROXime (CEFTIN) 250 MG tablet Take 1 tablet (250 mg total) by mouth 2 (two) times daily with a meal. 14 tablet 0  . fenofibrate 160 MG tablet Take 1 tablet (160 mg total) by mouth daily. 30 tablet 1  . glimepiride (AMARYL) 4 MG tablet Take 1 tablet (4 mg total) by mouth every morning. 30 tablet 1  . metFORMIN (GLUCOPHAGE) 500 MG tablet Take 2 tablets in the morning with a meal and 2 in the evening with a meal. 120 tablet 1  . rosuvastatin (CRESTOR) 20 MG tablet Take 1 tablet (20 mg total) by mouth at bedtime. 30 tablet 2  . TOUJEO SOLOSTAR 300 UNIT/ML SOPN INJECT 50 UNITS SUBCUTANEOUSLY ONCE DAILY 6 mL 0   No current facility-administered medications for this visit.     REVIEW OF SYSTEMS:  [X]  denotes positive finding, [ ]  denotes negative finding Cardiac   Comments:  Chest pain or chest pressure:    Shortness of breath upon exertion:    Short of breath when lying flat:    Irregular heart rhythm:        Vascular    Pain in calf, thigh, or hip brought on by ambulation:    Pain in feet at night that wakes you up from your sleep:     Blood clot in your veins:    Leg swelling:         Pulmonary    Oxygen at home:    Productive cough:     Wheezing:         Neurologic    Sudden weakness in arms or legs:     Sudden numbness in arms or legs:     Sudden onset of difficulty speaking or slurred speech:    Temporary loss of vision in one eye:     Problems with dizziness:         Gastrointestinal    Blood in stool:     Vomited blood:         Genitourinary    Burning when urinating:     Blood in urine:        Psychiatric    Major depression:         Hematologic    Bleeding problems:    Problems with blood clotting too easily:        Skin    Rashes or ulcers:        Constitutional    Fever or chills:      PHYSICAL EXAM: Vitals:   08/21/19 0948  BP: 134/67  Pulse: 78  Resp: 20  Temp: 97.9 F (36.6 C)  Weight: 184 lb (83.5 kg)  Height: 5\' 2"  (1.575 m)    GENERAL: The patient is a well-nourished female, in no acute distress. The vital signs are documented above. CARDIOVASCULAR: 2+ radial 2+ popliteal and 2+ dorsalis pedis pulses bilaterally PULMONARY: There is good air exchange  ABDOMEN: Soft and non-tender  MUSCULOSKELETAL: There are no major deformities or cyanosis. NEUROLOGIC: No focal weakness or paresthesias are detected. SKIN: There are no ulcers or rashes noted. PSYCHIATRIC: The patient has a normal affect.  DATA:  Noninvasive studies today reveal completely normal ankle arm index bilaterally and normal toe brachial index as well bilaterally  She did have a CT scan for other causes of her chest abdomen and pelvis 3-1/2 years ago.  I  reviewed these images and there is no evidence of any lesions that would  suggest atheroembolic phenomenon.  MEDICAL ISSUES: I had a long discussion with the patient.  I reassured her that she has completely normal flow to both feet and therefore has no risk of limb loss.  She does have what sounds like a very clear-cut episode of emboli to her right great toe.  She has no significant cardiac history.  We will obtain echocardiogram to rule out any valvular abnormality or other potential source of embolus.  If this is negative would obtain CT angiogram of chest, abdomen, pelvis and runoff to rule out peripheral source of embolus.  She does take an aspirin intermittently and I have requested that she become more regular with a daily aspirin for antiplatelet effect.  We will communicate with her following obtaining 2D echocardiogram to determine if CT scan is indicated   Rosetta Posner, MD [facility] Vascular and Vein Specialists of [facility] Tel [phone] Pager [phone]

## 2019-08-21 NOTE — Telephone Encounter (Signed)
The patient is back this morning. She still hasn't received her medication.

## 2019-08-21 NOTE — Telephone Encounter (Signed)
Patient is aware. Nothing further needed.  

## 2019-08-21 NOTE — Telephone Encounter (Signed)
All prescriptions have been sent in.

## 2019-08-22 ENCOUNTER — Ambulatory Visit (HOSPITAL_COMMUNITY)
Admission: RE | Admit: 2019-08-22 | Discharge: 2019-08-22 | Disposition: A | Discharge status: Home / Self Care | Payer: Medicare HMO | Source: Ambulatory Visit | Attending: Vascular Surgery | Admitting: Vascular Surgery

## 2019-08-22 DIAGNOSIS — I517 Cardiomegaly: Secondary | ICD-10-CM | POA: Insufficient documentation

## 2019-08-22 DIAGNOSIS — I358 Other nonrheumatic aortic valve disorders: Secondary | ICD-10-CM | POA: Insufficient documentation

## 2019-08-22 DIAGNOSIS — L819 Disorder of pigmentation, unspecified: Secondary | ICD-10-CM | POA: Insufficient documentation

## 2019-08-22 DIAGNOSIS — Z86711 Personal history of pulmonary embolism: Secondary | ICD-10-CM | POA: Diagnosis not present

## 2019-08-22 NOTE — Progress Notes (Signed)
  Echocardiogram 2D Echocardiogram has been performed.  Mary Caldwell 08/22/2019, 2:27 PM

## 2019-08-31 ENCOUNTER — Telehealth: Payer: Self-pay

## 2019-08-31 MED ORDER — OZEMPIC (0.25 OR 0.5 MG/DOSE) 2 MG/1.5ML ~~LOC~~ SOPN: 0.5000 mg | PEN_INJECTOR | SUBCUTANEOUS | 1 refills | Status: DC

## 2019-08-31 NOTE — Telephone Encounter (Signed)
-----   Message from [name], MD sent at 08/16/2019  7:34 AM EDT ----- Labs significant for:  A1C up to 7.9% and elevated triglycerides (LDL is controlled)..  She is already on Fenofibrate.  Recommend: -start Ozempic 0.25 mg [place] once weekly for 4 weeks and then increase to 0.5 mg [place] weekly  (Hopefully her insurance will cover).   Make sure she has follow up in 3 months.

## 2019-08-31 NOTE — Telephone Encounter (Signed)
Pt was called and given results. RX was sent to pharmacy. Pt verbalized understanding to call back if medication was expensive and unable to afford.

## 2019-09-11 ENCOUNTER — Other Ambulatory Visit: Payer: Self-pay | Admitting: Vascular Surgery

## 2019-09-11 DIAGNOSIS — R230 Cyanosis: Secondary | ICD-10-CM

## 2019-09-21 ENCOUNTER — Ambulatory Visit (HOSPITAL_COMMUNITY): Payer: Medicare HMO

## 2019-09-25 ENCOUNTER — Ambulatory Visit: Payer: Medicare HMO | Admitting: Vascular Surgery

## 2019-10-15 ENCOUNTER — Other Ambulatory Visit: Payer: Self-pay | Admitting: Family Medicine

## 2019-11-06 ENCOUNTER — Other Ambulatory Visit: Payer: Self-pay | Admitting: Vascular Surgery

## 2019-11-06 DIAGNOSIS — I75021 Atheroembolism of right lower extremity: Secondary | ICD-10-CM

## 2019-11-06 DIAGNOSIS — R230 Cyanosis: Secondary | ICD-10-CM

## 2019-11-19 ENCOUNTER — Other Ambulatory Visit: Payer: Self-pay | Admitting: Vascular Surgery

## 2019-11-19 DIAGNOSIS — I743 Embolism and thrombosis of arteries of the lower extremities: Secondary | ICD-10-CM

## 2019-11-20 ENCOUNTER — Other Ambulatory Visit: Payer: Self-pay | Admitting: *Deleted

## 2019-11-20 DIAGNOSIS — Z01812 Encounter for preprocedural laboratory examination: Secondary | ICD-10-CM

## 2019-11-21 ENCOUNTER — Other Ambulatory Visit: Payer: Medicare HMO

## 2019-11-21 DIAGNOSIS — Z01812 Encounter for preprocedural laboratory examination: Secondary | ICD-10-CM | POA: Diagnosis not present

## 2019-11-24 ENCOUNTER — Other Ambulatory Visit: Payer: Self-pay | Admitting: Family Medicine

## 2019-11-26 ENCOUNTER — Ambulatory Visit
Admission: RE | Admit: 2019-11-26 | Discharge: 2019-11-26 | Disposition: A | Discharge status: Home / Self Care | Payer: Medicare HMO | Source: Ambulatory Visit | Attending: Vascular Surgery | Admitting: Vascular Surgery

## 2019-11-26 DIAGNOSIS — I700 Atherosclerosis of aorta: Secondary | ICD-10-CM | POA: Diagnosis not present

## 2019-11-26 DIAGNOSIS — R230 Cyanosis: Secondary | ICD-10-CM

## 2019-11-26 DIAGNOSIS — I75021 Atheroembolism of right lower extremity: Secondary | ICD-10-CM

## 2019-11-26 DIAGNOSIS — I743 Embolism and thrombosis of arteries of the lower extremities: Secondary | ICD-10-CM

## 2019-11-26 MED ORDER — IOPAMIDOL (ISOVUE-370) INJECTION 76%
100.0000 mL | Freq: Once | INTRAVENOUS | Status: AC | PRN
  Administered 2019-11-26: 100 mL via INTRAVENOUS

## 2019-11-26 MED ORDER — IOPAMIDOL (ISOVUE-370) INJECTION 76%: 75.0000 mL | Freq: Once | INTRAVENOUS | Status: DC | PRN

## 2019-11-27 ENCOUNTER — Other Ambulatory Visit: Payer: Self-pay

## 2019-11-27 ENCOUNTER — Encounter: Payer: Self-pay | Admitting: Vascular Surgery

## 2019-11-27 ENCOUNTER — Ambulatory Visit: Payer: Medicare HMO | Admitting: Vascular Surgery

## 2019-11-27 VITALS — BP 144/83 | HR 90 | Temp 97.5°F | Resp 20 | Ht 62.0 in | Wt 174.4 lb

## 2019-11-27 DIAGNOSIS — I75021 Atheroembolism of right lower extremity: Secondary | ICD-10-CM | POA: Diagnosis not present

## 2019-11-27 NOTE — Progress Notes (Signed)
Vascular and Vein Specialist of [facility]  Patient name: Mary Caldwell MRN: [id number] DOB: 09-May-1946 Sex: female  REASON FOR VISIT: Follow-up echocardiogram and CT angiogram  HPI: Mary Caldwell is a 74 y.o. female here today for follow-up.  I have seen her in September 2020.  She had presented with cyanotic changes in her right great toe.  She did not require any trauma to her toe.  She was out of town and there were different scheduling issues but she finally got CT scan of her chest abdomen pelvis and runoff yesterday.  2D echocardiogram in September showed no cardiac source for atheroemboli.  She has had resolution of the cyanosis in her toe.  She does have what appears to be blood under the nail bed to suggest that this may have been trauma with hemorrhage under her nail bed.  This does not look like splinter hemorrhage.  Past Medical History:  Diagnosis Date  . ABDOMINAL ABSCESS 12/29/2009  . ASTHMA 02/11/2009  . DIABETES-TYPE 2 02/11/2009  . ESSENTIAL HYPERTENSION 02/11/2009  . HYPERLIPIDEMIA 02/11/2009  . OBESITY 02/11/2009    Family History  Problem Relation Age of Onset  . Arthritis Other   . Heart disease Other        grandparent  . Diabetes Other        parent  . Colon cancer Sister     SOCIAL HISTORY: Social History   Tobacco Use  . Smoking status: Never Smoker  . Smokeless tobacco: Never Used  Substance Use Topics  . Alcohol use: Yes    Alcohol/week: 0.0 standard drinks    Comment: only on rare holidays    Allergies  Allergen Reactions  . Atorvastatin     Does not remember  . Codeine Sulfate Nausea Only    GI upset    Current Outpatient Medications  Medication Sig Dispense Refill  . albuterol (VENTOLIN HFA) 108 (90 Base) MCG/ACT inhaler Inhale 2 puffs into the lungs every 4 (four) hours as needed for wheezing or shortness of breath (or coughing). 18 g 0  . aspirin EC 81 MG tablet Take 81 mg by mouth daily.    .  cefUROXime (CEFTIN) 250 MG tablet Take 1 tablet (250 mg total) by mouth 2 (two) times daily with a meal. 14 tablet 0  . fenofibrate 160 MG tablet TAKE 1 TABLET BY MOUTH ONCE DAILY -SCHEDULE  A  FOLLOW  UP  FOR  FUTHER  REFILLS  [phone] 30 tablet 0  . glimepiride (AMARYL) 4 MG tablet TAKE 1 TABLET BY MOUTH IN THE MORNING -  PLEASE  SCHEDULE  A  FOLLOW  UP  [phone] 30 tablet 0  . metFORMIN (GLUCOPHAGE) 500 MG tablet TAKE 2 TABLETS BY MOUTH IN THE MORNING WITH A MEAL AND 2 IN THE EVENING WITH A MEAL 120 tablet 0  . rosuvastatin (CRESTOR) 20 MG tablet TAKE 1 TABLET BY MOUTH AT BEDTIME -  NEED  TO  FOLLOW  UP  [id number] 30 tablet 0  . Semaglutide,0.25 or 0.5MG /DOS, (OZEMPIC, 0.25 OR 0.5 MG/DOSE,) 2 MG/1.5ML SOPN Inject 0.5 mg into the skin once a week. Inject 0.25 mg [place] once weekly for 4 weeks and then increase to 0.5 mg [place] weekly 1 pen 1  . TOUJEO SOLOSTAR 300 UNIT/ML SOPN INJECT 50 UNITS SUBCUTANEOUSLY ONCE DAILY 6 mL 0   No current facility-administered medications for this visit.    REVIEW OF SYSTEMS:  [X]  denotes positive finding, [ ]  denotes negative finding Cardiac  Comments:  Chest pain or chest pressure:    Shortness of breath upon exertion:    Short of breath when lying flat:    Irregular heart rhythm:        Vascular    Pain in calf, thigh, or hip brought on by ambulation:    Pain in feet at night that wakes you up from your sleep:     Blood clot in your veins:    Leg swelling:           PHYSICAL EXAM: Vitals:   11/27/19 1019  BP: (!) 144/83  Pulse: 90  Resp: 20  Temp: (!) 97.5 F (36.4 C)  SpO2: 95%  Weight: 174 lb 6.4 oz (79.1 kg)  Height: 5\' 2"  (1.575 m)    GENERAL: The patient is a well-nourished female, in no acute distress. The vital signs are documented above. CARDIOVASCULAR: 2+ dorsalis pedis pulses bilaterally.  2+ radial pulses bilaterally. PULMONARY: There is good air exchange  MUSCULOSKELETAL: There are no major deformities or  cyanosis. NEUROLOGIC: No focal weakness or paresthesias are detected. SKIN: There are no ulcers or rashes noted.  Darkening under the right great toe nail bed PSYCHIATRIC: The patient has a normal affect.  DATA:  CT scan yesterday showed no evidence of narrowing or source of embolus with irregularity in her chest abdomen pelvis.  The timing of the dye did not show good resolution below the level of the popliteal arteries bilaterally  MEDICAL ISSUES: Discussed these findings with the patient.  Do not see any evidence of arterial or cardiac source for atheroemboli.  Reassured the patient regarding this.  She will see [place] again on an as-needed basis    Rosetta Posner, MD [facility] Vascular and Vein Specialists of [facility] Tel [phone] Pager [phone]

## 2019-12-21 ENCOUNTER — Other Ambulatory Visit: Payer: Self-pay | Admitting: Family Medicine

## 2020-01-18 ENCOUNTER — Other Ambulatory Visit: Payer: Self-pay | Admitting: Family Medicine

## 2020-01-21 ENCOUNTER — Other Ambulatory Visit: Payer: Self-pay | Admitting: Family Medicine

## 2020-01-21 ENCOUNTER — Telehealth: Payer: Self-pay | Admitting: Family Medicine

## 2020-01-21 NOTE — Telephone Encounter (Signed)
Mediation Refill: Metformin Rosuvastatin  Glimepiride Fenofibrate  Pharmacy: [facility] [address]   FAX: [phone]

## 2020-01-22 ENCOUNTER — Other Ambulatory Visit: Payer: Self-pay | Admitting: *Deleted

## 2020-01-22 MED ORDER — ROSUVASTATIN CALCIUM 20 MG PO TABS: ORAL_TABLET | ORAL | 0 refills | Status: DC

## 2020-01-22 MED ORDER — GLIMEPIRIDE 4 MG PO TABS: ORAL_TABLET | ORAL | 0 refills | Status: DC

## 2020-01-22 MED ORDER — METFORMIN HCL 500 MG PO TABS: ORAL_TABLET | ORAL | 0 refills | Status: DC

## 2020-01-22 MED ORDER — FENOFIBRATE 160 MG PO TABS: ORAL_TABLET | ORAL | 0 refills | Status: DC

## 2020-01-22 NOTE — Telephone Encounter (Signed)
Rx's sent to pharmacy. Patient aware that she needs office visit for refills.

## 2020-02-04 ENCOUNTER — Ambulatory Visit: Payer: Medicare HMO | Admitting: Family Medicine

## 2020-02-04 DIAGNOSIS — Z0289 Encounter for other administrative examinations: Secondary | ICD-10-CM

## 2020-02-05 ENCOUNTER — Other Ambulatory Visit: Payer: Self-pay

## 2020-02-06 ENCOUNTER — Ambulatory Visit (INDEPENDENT_AMBULATORY_CARE_PROVIDER_SITE_OTHER): Payer: Medicare HMO | Admitting: Family Medicine

## 2020-02-06 ENCOUNTER — Encounter: Payer: Self-pay | Admitting: Family Medicine

## 2020-02-06 VITALS — BP 122/66 | HR 90 | Temp 97.6°F | Wt 171.4 lb

## 2020-02-06 DIAGNOSIS — E1165 Type 2 diabetes mellitus with hyperglycemia: Secondary | ICD-10-CM | POA: Diagnosis not present

## 2020-02-06 DIAGNOSIS — L989 Disorder of the skin and subcutaneous tissue, unspecified: Secondary | ICD-10-CM

## 2020-02-06 DIAGNOSIS — E1140 Type 2 diabetes mellitus with diabetic neuropathy, unspecified: Secondary | ICD-10-CM | POA: Diagnosis not present

## 2020-02-06 DIAGNOSIS — IMO0002 Reserved for concepts with insufficient information to code with codable children: Secondary | ICD-10-CM

## 2020-02-06 DIAGNOSIS — I75029 Atheroembolism of unspecified lower extremity: Secondary | ICD-10-CM | POA: Diagnosis not present

## 2020-02-06 DIAGNOSIS — L858 Other specified epidermal thickening: Secondary | ICD-10-CM

## 2020-02-06 DIAGNOSIS — E785 Hyperlipidemia, unspecified: Secondary | ICD-10-CM | POA: Diagnosis not present

## 2020-02-06 DIAGNOSIS — B078 Other viral warts: Secondary | ICD-10-CM | POA: Diagnosis not present

## 2020-02-06 DIAGNOSIS — I10 Essential (primary) hypertension: Secondary | ICD-10-CM

## 2020-02-06 LAB — POCT GLYCOSYLATED HEMOGLOBIN (HGB A1C): Hemoglobin A1C: 6.6 % — AB (ref 4.0–5.6)

## 2020-02-06 NOTE — Patient Instructions (Signed)
Keep wound dry for the first 24 hours then clean daily with soap and water for one week. Apply vaseline daily for 3-4 days. Keep covered with clean dressing for 4-5 days. Follow up promptly for any signs of infection such as redness, warmth, pain, or drainage.  
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
## 2020-02-18 ENCOUNTER — Other Ambulatory Visit: Payer: Self-pay | Admitting: Family Medicine

## 2020-03-14 ENCOUNTER — Other Ambulatory Visit: Payer: Self-pay | Admitting: Family Medicine

## 2020-03-21 ENCOUNTER — Other Ambulatory Visit: Payer: Self-pay

## 2020-03-24 ENCOUNTER — Ambulatory Visit (INDEPENDENT_AMBULATORY_CARE_PROVIDER_SITE_OTHER): Payer: Medicare HMO | Admitting: Family Medicine

## 2020-03-24 ENCOUNTER — Other Ambulatory Visit: Payer: Self-pay

## 2020-03-24 ENCOUNTER — Encounter: Payer: Self-pay | Admitting: Family Medicine

## 2020-03-24 VITALS — BP 110/70 | HR 73 | Temp 98.1°F | Wt 165.0 lb

## 2020-03-24 DIAGNOSIS — IMO0002 Reserved for concepts with insufficient information to code with codable children: Secondary | ICD-10-CM

## 2020-03-24 DIAGNOSIS — E1140 Type 2 diabetes mellitus with diabetic neuropathy, unspecified: Secondary | ICD-10-CM | POA: Diagnosis not present

## 2020-03-24 DIAGNOSIS — E1165 Type 2 diabetes mellitus with hyperglycemia: Secondary | ICD-10-CM | POA: Diagnosis not present

## 2020-03-24 MED ORDER — TOUJEO MAX SOLOSTAR 300 UNIT/ML ~~LOC~~ SOPN: PEN_INJECTOR | SUBCUTANEOUS | 6 refills | Status: DC
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
## 2020-03-24 NOTE — Patient Instructions (Signed)
Start the Toujeo at 20 units once daily and then titrate up 2 units every 3 days until fasting sugar consistently < 130

## 2020-03-28 ENCOUNTER — Telehealth: Payer: Self-pay | Admitting: Family Medicine

## 2020-03-28 NOTE — Chronic Care Management (AMB) (Signed)
  Chronic Care Management   Outreach Note  03/28/2020 Name: Mary Caldwell MRN: [id number] DOB: 12-07-1945  Referred by: [name], MD Reason for referral : Chronic Care Management   An unsuccessful telephone outreach was attempted today. The patient was referred to the pharmacist for assistance with care management and care coordination.   Follow Up Plan:   [place]

## 2020-03-31 ENCOUNTER — Other Ambulatory Visit: Payer: Self-pay | Admitting: Family Medicine

## 2020-04-03 ENCOUNTER — Other Ambulatory Visit: Payer: Self-pay | Admitting: Family Medicine

## 2020-04-09 ENCOUNTER — Telehealth: Payer: Self-pay | Admitting: Family Medicine

## 2020-04-09 NOTE — Progress Notes (Signed)
°  Chronic Care Management   Outreach Note  04/09/2020 Name: Mary Caldwell MRN: [id number] DOB: [birth year]  Referred by: [name], MD Reason for referral : No chief complaint on file.   A second unsuccessful telephone outreach was attempted today. The patient was referred to pharmacist for assistance with care management and care coordination.  Follow Up Plan:   Prathima Ghanta [employer] Scheduler

## 2020-04-22 ENCOUNTER — Telehealth: Payer: Self-pay | Admitting: Family Medicine

## 2020-04-22 NOTE — Progress Notes (Signed)
°  Chronic Care Management   Outreach Note  04/22/2020 Name: Mary Caldwell MRN: [id number] DOB: 06-07-1946  Referred by: [name], MD Reason for referral : No chief complaint on file.   Third unsuccessful telephone outreach was attempted today. The patient was referred to the pharmacist for assistance with care management and care coordination.   Follow Up Plan:   Prathima Ghanta [employer] Scheduler

## 2020-06-18 ENCOUNTER — Other Ambulatory Visit: Payer: Self-pay | Admitting: Family Medicine

## 2020-06-19 ENCOUNTER — Other Ambulatory Visit: Payer: Self-pay | Admitting: Family Medicine

## 2020-06-24 ENCOUNTER — Telehealth: Payer: Self-pay | Admitting: Family Medicine

## 2020-06-24 ENCOUNTER — Ambulatory Visit (INDEPENDENT_AMBULATORY_CARE_PROVIDER_SITE_OTHER): Payer: Medicare HMO | Admitting: Family Medicine

## 2020-06-24 ENCOUNTER — Other Ambulatory Visit: Payer: Self-pay

## 2020-06-24 ENCOUNTER — Encounter: Payer: Self-pay | Admitting: Family Medicine

## 2020-06-24 VITALS — BP 134/76 | HR 66 | Temp 98.6°F | Wt 174.2 lb

## 2020-06-24 DIAGNOSIS — E1165 Type 2 diabetes mellitus with hyperglycemia: Secondary | ICD-10-CM | POA: Diagnosis not present

## 2020-06-24 DIAGNOSIS — IMO0002 Reserved for concepts with insufficient information to code with codable children: Secondary | ICD-10-CM

## 2020-06-24 DIAGNOSIS — E1140 Type 2 diabetes mellitus with diabetic neuropathy, unspecified: Secondary | ICD-10-CM | POA: Diagnosis not present

## 2020-06-24 LAB — POCT GLYCOSYLATED HEMOGLOBIN (HGB A1C): Hemoglobin A1C: 6.9 % — AB (ref 4.0–5.6)

## 2020-06-24 NOTE — Progress Notes (Signed)
  Chronic Care Management   Outreach Note  06/24/2020 Name: Mary Caldwell MRN: [id number] DOB: March 13, 1946  Referred by: [name], MD Reason for referral : No chief complaint on file.   Third unsuccessful telephone outreach was attempted today. The patient was referred to the pharmacist for assistance with care management and care coordination.   Follow Up Plan:   Carley Perdue [employer] Scheduler

## 2020-06-24 NOTE — Progress Notes (Signed)
  Chronic Care Management   Note  06/24/2020 Name: KORDELIA SEVERIN MRN: [id number] DOB: [birth year]  Rito Ehrlich Nicodemus is a 74 y.o. year old female who is a primary care patient of Burchette, Alinda Sierras, MD. I reached out to [name] by phone today in response to a referral sent by Ms. Rito Ehrlich Zell's PCP, [name], MD.   Ms. Hult was given information about Chronic Care Management services today including:  1. CCM service includes personalized support from designated clinical staff supervised by her physician, including individualized plan of care and coordination with other care providers 2. 24/7 contact phone numbers for assistance for urgent and routine care needs. 3. Service will only be billed when office clinical staff spend 20 minutes or more in a month to coordinate care. 4. Only one practitioner may furnish and bill the service in a calendar month. 5. The patient may stop CCM services at any time (effective at the end of the month) by phone call to the office staff.   Patient agreed to services and verbal consent obtained.   Follow up plan:   Carley Perdue [employer] Scheduler
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
## 2020-06-24 NOTE — Patient Instructions (Signed)
A1C is stable at 6.9%  We will set up follow up with our pharmacist to see if we can get any help with your medication.

## 2020-07-11 ENCOUNTER — Other Ambulatory Visit: Payer: Self-pay

## 2020-07-11 DIAGNOSIS — I10 Essential (primary) hypertension: Secondary | ICD-10-CM

## 2020-07-11 DIAGNOSIS — E1140 Type 2 diabetes mellitus with diabetic neuropathy, unspecified: Secondary | ICD-10-CM

## 2020-07-11 DIAGNOSIS — IMO0002 Reserved for concepts with insufficient information to code with codable children: Secondary | ICD-10-CM

## 2020-07-17 ENCOUNTER — Telehealth: Payer: Self-pay

## 2020-07-17 NOTE — Progress Notes (Signed)
    Chronic Care Management Pharmacy Assistant   Name: Mary Caldwell  MRN: [id number] DOB: 1946/05/01  Reason for Encounter: Medication Review/ Initial Questions for Initial visit with clinical pharmacist.  PCP : [name], MD  Allergies:   Allergies  Allergen Reactions  . Atorvastatin     Does not remember  . Codeine Sulfate Nausea Only    GI upset    Medications: Outpatient Encounter Medications as of 07/17/2020  Medication Sig  . albuterol (VENTOLIN HFA) 108 (90 Base) MCG/ACT inhaler Inhale 2 puffs into the lungs every 4 (four) hours as needed for wheezing or shortness of breath (or coughing).  Marland Kitchen aspirin EC 81 MG tablet Take 81 mg by mouth daily.  . fenofibrate 160 MG tablet Take 1 tablet by mouth once daily  . glimepiride (AMARYL) 4 MG tablet TAKE 1 TABLET BY MOUTH IN THE MORNING  . insulin glargine, 2 Unit Dial, (TOUJEO MAX SOLOSTAR) 300 UNIT/ML Solostar Pen Take 20 units [place] once daily at bedtime (Patient taking differently: Inject 30 Units into the skin daily. Take 20 units [place] once daily at bedtime)  . metFORMIN (GLUCOPHAGE) 500 MG tablet TAKE TWO TABLETS BY MOUTH TWICE DAILY **TAKE  WITH  FOOD**  . rosuvastatin (CRESTOR) 20 MG tablet TAKE 1 TABLET BY MOUTH AT BEDTIME   No facility-administered encounter medications on file as of 07/17/2020.    Current Diagnosis: Patient Active Problem List   Diagnosis Date Noted  . Hypoxia 04/18/2016  . Obesity (BMI 30-39.9) 09/07/2013  . ABDOMINAL ABSCESS 12/29/2009  . Type 2 diabetes, controlled, with neuropathy ([place]) 02/11/2009  . Hyperlipidemia 02/11/2009  . OBESITY 02/11/2009  . Essential hypertension 02/11/2009  . ASTHMA 02/11/2009    Goals Addressed   None     Follow-Up:  Pharmacist Review   Have you seen any other providers since your last visit? **no Any changes in your medications or health? no Any side effects from any medications? no Do you have an symptoms or problems not managed by your medications?  no Any concerns about your health right now? no Has your provider asked that you check blood pressure, blood sugar, or follow special diet at home? No  Patient states she cooks at home, and mostly eat vegetables.She drinks water,coffee and sweet tea. Do you get any type of exercise on a regular basis? Yes  Patients states she walks every day for 30 to 40 mintues at a trail near her house depending on the weather. Can you think of a goal you would like to reach for your health?   Patient reports she would like to lose weight.  Do you have any problems getting your medications? Yes  Patient states her diabetes medications is to much for her to afford. Is there anything that you would like to discuss during the appointment?   Patient states she would like to discuss patient assistance for the expenses of her medications.  Please bring medications and supplements to appointment  [place] Pharmacist Assistant [phone]

## 2020-07-18 ENCOUNTER — Ambulatory Visit: Payer: Medicare HMO

## 2020-07-18 DIAGNOSIS — E785 Hyperlipidemia, unspecified: Secondary | ICD-10-CM

## 2020-07-18 DIAGNOSIS — E1140 Type 2 diabetes mellitus with diabetic neuropathy, unspecified: Secondary | ICD-10-CM

## 2020-07-18 NOTE — Chronic Care Management (AMB) (Signed)
Chronic Care Management Pharmacy  Name: RACHEL RISON  MRN: [id number] DOB: [birth year]   Chief Complaint/ HPI  [name],  74 y.o. , female presents for their Initial CCM visit with the clinical pharmacist via telephone.  PCP : [name], MD Patient Care Team: [name], MD as PCP - General Germaine [place], [facility] as Pharmacist (Pharmacist)  Their chronic conditions include: Hypertension, Hyperlipidemia, Diabetes and Asthma   Office Visits: 06/24/20: Patient presented to Dr. Elease Hashimoto for follow-up. Patient stated she would get her Covid Vaccine.  03/24/20: Patient presented to Dr. Elease Hashimoto for follow-up. Ozempic stopped due to cost. Toujeo 20 units daily started.  02/06/20: Patient presented to Dr. Elease Hashimoto for follow-up. A1c 6.6%. No medication changes made.   Consult Visit: None noted in past 6 months   Allergies  Allergen Reactions  . Atorvastatin     Does not remember  . Codeine Sulfate Nausea Only    GI upset    Medications: Outpatient Encounter Medications as of 07/18/2020  Medication Sig  . aspirin EC 81 MG tablet Take 81 mg by mouth daily.  . fenofibrate 160 MG tablet Take 1 tablet by mouth once daily  . glimepiride (AMARYL) 4 MG tablet TAKE 1 TABLET BY MOUTH IN THE MORNING  . insulin glargine, 2 Unit Dial, (TOUJEO MAX SOLOSTAR) 300 UNIT/ML Solostar Pen Take 20 units [place] once daily at bedtime (Patient taking differently: Inject 30 Units into the skin daily. )  . metFORMIN (GLUCOPHAGE) 500 MG tablet TAKE TWO TABLETS BY MOUTH TWICE DAILY **TAKE  WITH  FOOD**  . rosuvastatin (CRESTOR) 20 MG tablet TAKE 1 TABLET BY MOUTH AT BEDTIME  . albuterol (VENTOLIN HFA) 108 (90 Base) MCG/ACT inhaler Inhale 2 puffs into the lungs every 4 (four) hours as needed for wheezing or shortness of breath (or coughing).   No facility-administered encounter medications on file as of 07/18/2020.     Current Diagnosis/Assessment:  SDOH Interventions     Most Recent  Value  SDOH Interventions  Financial Strain Interventions Intervention Not Indicated  Transportation Interventions Intervention Not Indicated      Goals Addressed            This Visit's Progress   . Chronic Care Management       CARE PLAN ENTRY (see longitudinal plan of care for additional care plan information)  Current Barriers:  . Chronic Disease Management support, education, and care coordination needs related to Hypertension, Hyperlipidemia, Diabetes and Asthma    Hypertension BP Readings from Last 3 Encounters:  06/24/20 134/76  03/24/20 110/70  02/06/20 122/66   . Pharmacist Clinical Goal(s): o Over the next 90 days, patient will work with PharmD and providers to maintain BP goal <130/80 . Interventions: o Discussed low salt diet and exercising as tolerated extensively . Patient self care activities - Over the next 90 days, patient will: o Ensure daily salt intake < 2300 mg/day  Hyperlipidemia Lab Results  Component Value Date/Time   LDLDIRECT 51.0 08/15/2019 02:37 PM   . Pharmacist Clinical Goal(s): o Over the next 90 days, patient will work with PharmD and providers to maintain LDL goal < 70 . Current regimen:  . Fenofibrate 160 mg daily  . Rosuvastatin 20 mg daily  . Interventions: o Discussed low cholesterol diet and exercising as tolerated extensively  Diabetes Lab Results  Component Value Date/Time   HGBA1C 6.9 (A) 06/24/2020 08:42 AM   HGBA1C 6.6 (A) 02/06/2020 07:40 AM   HGBA1C 7.9 (  H) 08/15/2019 02:37 PM   HGBA1C 7.7 (H) 01/24/2017 10:03 AM   . Pharmacist Clinical Goal(s): o Over the next 90 days, patient will work with PharmD and providers to maintain A1c goal <7% . Current regimen:  Marland Kitchen Glimepiride 4 mg daily . Toujeo Max Solostar 30 units daily  . Metformin 500 mg 2 tablets twice daily  . Interventions: o Discussed carbohydrate counting and exercising as tolerated extensively . Patient self care activities - Over the next 90 days,  patient will: o Check blood sugar once daily (Fasting or Bedtime), document, and provide at future appointments o Contact provider with any episodes of hypoglycemia  Medication management . Pharmacist Clinical Goal(s): o Over the next 90 days, patient will work with PharmD and providers to maintain optimal medication adherence . Current pharmacy: [facility] . Interventions o Comprehensive medication review performed. o Continue current medication management strategy . Patient self care activities - Over the next 90 days, patient will: o Take medications as prescribed o Report any questions or concerns to PharmD and/or provider(s)       Asthma   Last spirometry score: n/a  Eosinophil count:   Lab Results  Component Value Date/Time   EOSPCT 4 06/08/2019 01:40 AM  %                               Eos (Absolute):  Lab Results  Component Value Date/Time   EOSABS 0.3 06/08/2019 01:40 AM   Tobacco Status:  Social History   Tobacco Use  Smoking Status Never Smoker  Smokeless Tobacco Never Used    Patient has failed these meds in past: n/a Patient is currently controlled on the following medications:  . Ventolin HFA 108 mcg/act 2 puff q4hr PRN (Not taking)  Using maintenance inhaler regularly? No Frequency of rescue inhaler use:  never  We discussed: Patient has not used her inhaler in >1 year. She states her asthma is more driven by allergies but rarely troubles her.   Plan  Recommend discontinuing albuterol inhaler since patient has not needed in >1 year.   Diabetes   A1c goal <7%  Recent Relevant Labs: Lab Results  Component Value Date/Time   HGBA1C 6.9 (A) 06/24/2020 08:42 AM   HGBA1C 6.6 (A) 02/06/2020 07:40 AM   HGBA1C 7.9 (H) 08/15/2019 02:37 PM   HGBA1C 7.7 (H) 01/24/2017 10:03 AM   GFR 84.77 08/15/2019 02:37 PM   GFR 84.52 11/09/2018 11:12 AM   MICROALBUR <0.7 09/13/2018 05:07 PM   MICROALBUR 1.6 01/24/2017 10:03 AM    Last diabetic Eye exam:  Lab  Results  Component Value Date/Time   HMDIABEYEEXA No Retinopathy 03/16/2017 12:00 AM    Last diabetic Foot exam:  Lab Results  Component Value Date/Time   HMDIABFOOTEX normal 09/07/2013 12:00 AM     Checking BG: Daily  Recent FBG Readings: 130,140  Recent pre-meal BG readings: n/a Recent 2hr PP BG readings:  n/a Recent HS BG readings: n/a  Patient has failed these meds in past: Ozempic (Cost)  Patient is currently controlled on the following medications: . Glimepiride 4 mg daily . Toujeo Max Solostar 30 units daily  . Metformin 500 mg 2 tablets twice daily   We discussed: diet and exercise extensively Denies symptoms of hypoglycemia.   Patient states she eats a varied diet, but struggles with sweets. She has worked on [employer] on starches and sweets. She drinks mainly coffee, tea, water. She  will eat meat 3x weekly, mostly chicken.   She was previously fairly active on the walking trails near her home, but has not been going recently due to the heat. We discussed the importance of regular exercise and suggested trying walking earlier in the morning or later in the evening to avoid the heat.   Patient states she previously did very well with Ozempic, but switched to Toujeo due to cost concerns. Given patient's income level, she would likely qualify for PAP for her Branded medications. Patient was unsure if she wanted to restart on Ozempic, but stated she will discuss with her son and let me know if she wants to pursue a PAP for either Ozempic or Toujeo.   Plan  Continue current medications  Hypertension   BP goal is:  <130/80  Office blood pressures are  BP Readings from Last 3 Encounters:  06/24/20 134/76  03/24/20 110/70  02/06/20 122/66   Patient checks BP at home Never Patient home BP readings are ranging: n/a  Patient has failed these meds in the past: n/a Patient is currently controlled on the following medications:  . None  We discussed diet and exercise  extensively  Plan  Continue control with diet and exercise  Hyperlipidemia   LDL goal < 70  Lipid Panel     Component Value Date/Time   CHOL 138 08/15/2019 1437   TRIG (H) 08/15/2019 1437    557.0 Triglyceride is over 400; calculations on Lipids are invalid.   HDL 30.10 (L) 08/15/2019 1437   LDLDIRECT 51.0 08/15/2019 1437    Hepatic Function Latest Ref Rng & Units 08/15/2019 06/08/2019 11/09/2018  Total Protein 6.0 - 8.3 g/dL 6.2 6.6 6.5  Albumin 3.5 - 5.2 g/dL 4.1 3.8 4.0  AST 0 - 37 U/L _0 ALT 0 - 35 U/L _1 Alk Phosphatase 39 - 117 U/L 32(L) 29(L) 33(L)  Total Bilirubin 0.2 - 1.2 mg/dL 0.4 0.6 0.7  Bilirubin, Direct 0.0 - 0.3 mg/dL 0.1 - -     The 10-year ASCVD risk score Mikey Bussing DC Jr., et al., 2013) is: 27.3%   Values used to calculate the score:     Age: 37 years     Sex: Female     Is Non-Hispanic African American: No     Diabetic: Yes     Tobacco smoker: No     Systolic Blood Pressure: 144 mmHg     Is BP treated: No     HDL Cholesterol: 30.1 mg/dL     Total Cholesterol: 138 mg/dL   Patient has failed these meds in past: n/a Patient is currently uncontrolled on the following medications:  . Aspirin 81 mg daily  . Fenofibrate 160 mg QHS  . Rosuvastatin 20 mg QHS   We discussed:  diet and exercise extensively  Plan  Recommend fasting lipid panel at next office visit. If TG >500, recommend starting Lovaza 2g twice daily   Vaccines   Reviewed and discussed patient's vaccination history.    Immunization History  Administered Date(s) Administered  . Influenza Split 10/13/2011  . Influenza, High Dose Seasonal PF 12/03/2015, 09/27/2016, 09/13/2018  . Influenza,inj,Quad PF,6+ Mos 09/07/2013, 10/01/2014  . Pneumococcal Conjugate-13 12/03/2015  . Pneumococcal Polysaccharide-23 09/07/2013  . Td 11/22/2005   Patient received the [place] on 07/08/20. The second dose is due on 07/29/20.   Plan  Recommended patient receive Shingrix  vaccine  Medication Management   Pt uses [facility] pharmacy for all medications  Uses pill box? Yes   Plan  Continue current medication management strategy  Follow up: 12 month phone visit  [facility] [place]

## 2020-08-08 ENCOUNTER — Ambulatory Visit: Payer: Medicare HMO | Admitting: Family Medicine

## 2020-08-28 ENCOUNTER — Other Ambulatory Visit: Payer: Self-pay | Admitting: Family Medicine

## 2020-09-03 ENCOUNTER — Telehealth: Payer: Self-pay | Admitting: Family Medicine

## 2020-09-03 NOTE — Telephone Encounter (Signed)
Patent request a call back on September 22, 2020

## 2020-09-16 ENCOUNTER — Telehealth: Payer: Self-pay

## 2020-09-16 NOTE — Progress Notes (Signed)
  I have attempted without success to contact this patient by phone three times to do her Diabetes Disease State call. I left a Voice message for patient to return my call.  [place] Pharmacist Assistant [phone]

## 2020-09-17 ENCOUNTER — Other Ambulatory Visit: Payer: Self-pay | Admitting: Family Medicine

## 2020-09-24 ENCOUNTER — Ambulatory Visit: Payer: Medicare HMO | Admitting: Family Medicine

## 2020-09-26 ENCOUNTER — Telehealth: Payer: Self-pay | Admitting: Family Medicine

## 2020-09-26 NOTE — Telephone Encounter (Signed)
Left message for patient to schedule Annual Wellness Visit.  Please schedule with Nurse Health Advisor Martha Stanley, RN at [facility] [place]  °

## 2020-09-30 ENCOUNTER — Other Ambulatory Visit: Payer: Self-pay | Admitting: Family Medicine

## 2020-11-03 ENCOUNTER — Ambulatory Visit (INDEPENDENT_AMBULATORY_CARE_PROVIDER_SITE_OTHER): Payer: Medicare HMO | Admitting: Family Medicine

## 2020-11-03 ENCOUNTER — Encounter: Payer: Self-pay | Admitting: Family Medicine

## 2020-11-03 ENCOUNTER — Other Ambulatory Visit: Payer: Self-pay

## 2020-11-03 VITALS — BP 110/68 | HR 103 | Temp 98.2°F | Ht 62.0 in | Wt 181.0 lb

## 2020-11-03 DIAGNOSIS — R300 Dysuria: Secondary | ICD-10-CM

## 2020-11-03 LAB — POCT URINALYSIS DIPSTICK
Bilirubin, UA: NEGATIVE
Blood, UA: 3
Glucose, UA: NEGATIVE
Ketones, UA: NEGATIVE
Nitrite, UA: NEGATIVE
Protein, UA: POSITIVE — AB
Spec Grav, UA: 1.025 (ref 1.010–1.025)
Urobilinogen, UA: 0.2 E.U./dL
pH, UA: 6 (ref 5.0–8.0)

## 2020-11-03 MED ORDER — CEPHALEXIN 500 MG PO CAPS: 500.0000 mg | ORAL_CAPSULE | Freq: Three times a day (TID) | ORAL | 0 refills | Status: DC

## 2020-11-03 NOTE — Patient Instructions (Signed)
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
## 2020-11-05 LAB — URINE CULTURE
MICRO NUMBER:: 11309832
SPECIMEN QUALITY:: ADEQUATE

## 2020-11-12 ENCOUNTER — Ambulatory Visit (INDEPENDENT_AMBULATORY_CARE_PROVIDER_SITE_OTHER): Payer: Medicare HMO | Admitting: Family Medicine

## 2020-11-12 ENCOUNTER — Other Ambulatory Visit: Payer: Self-pay

## 2020-11-12 VITALS — BP 124/70 | HR 84 | Temp 97.8°F | Ht 62.0 in | Wt 180.1 lb

## 2020-11-12 DIAGNOSIS — H6123 Impacted cerumen, bilateral: Secondary | ICD-10-CM | POA: Diagnosis not present

## 2020-11-12 NOTE — Patient Instructions (Signed)
Earwax Buildup, Adult The ears produce a substance called earwax that helps keep bacteria out of the ear and protects the skin in the ear canal. Occasionally, earwax can build up in the ear and cause discomfort or hearing loss. What increases the risk? This condition is more likely to develop in people who:  Are female.  Are elderly.  Naturally produce more earwax.  Clean their ears often with cotton swabs.  Use earplugs often.  Use in-ear headphones often.  Wear hearing aids.  Have narrow ear canals.  Have earwax that is overly thick or sticky.  Have eczema.  Are dehydrated.  Have excess hair in the ear canal. What are the signs or symptoms? Symptoms of this condition include:  Reduced or muffled hearing.  A feeling of fullness in the ear or feeling that the ear is plugged.  Fluid coming from the ear.  Ear pain.  Ear itch.  Ringing in the ear.  Coughing.  An obvious piece of earwax that can be seen inside the ear canal. How is this diagnosed? This condition may be diagnosed based on:  Your symptoms.  Your medical history.  An ear exam. During the exam, your health care provider will look into your ear with an instrument called an otoscope. You may have tests, including a hearing test. How is this treated? This condition may be treated by:  Using ear drops to soften the earwax.  Having the earwax removed by a health care provider. The health care provider may: ? Flush the ear with water. ? Use an instrument that has a loop on the end (curette). ? Use a suction device.  Surgery to remove the wax buildup. This may be done in severe cases. Follow these instructions at home:   Take over-the-counter and prescription medicines only as told by your health care provider.  Do not put any objects, including cotton swabs, into your ear. You can clean the opening of your ear canal with a washcloth or facial tissue.  Follow instructions from your health care  provider about cleaning your ears. Do not over-clean your ears.  Drink enough fluid to keep your urine clear or pale yellow. This will help to thin the earwax.  Keep all follow-up visits as told by your health care provider. If earwax builds up in your ears often or if you use hearing aids, consider seeing your health care provider for routine, preventive ear cleanings. Ask your health care provider how often you should schedule your cleanings.  If you have hearing aids, clean them according to instructions from the manufacturer and your health care provider. Contact a health care provider if:  You have ear pain.  You develop a fever.  You have blood, pus, or other fluid coming from your ear.  You have hearing loss.  You have ringing in your ears that does not go away.  Your symptoms do not improve with treatment.  You feel like the room is spinning (vertigo). Summary  Earwax can build up in the ear and cause discomfort or hearing loss.  The most common symptoms of this condition include reduced or muffled hearing and a feeling of fullness in the ear or feeling that the ear is plugged.  This condition may be diagnosed based on your symptoms, your medical history, and an ear exam.  This condition may be treated by using ear drops to soften the earwax or by having the earwax removed by a health care provider.  Do not put any   objects, including cotton swabs, into your ear. You can clean the opening of your ear canal with a washcloth or facial tissue. This information is not intended to replace advice given to you by your health care provider. Make sure you discuss any questions you have with your health care provider. Document Revised: 10/21/2017 Document Reviewed: 01/19/2017 Elsevier Patient Education  2020 [employer]  
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
## 2020-11-28 ENCOUNTER — Emergency Department (HOSPITAL_BASED_OUTPATIENT_CLINIC_OR_DEPARTMENT_OTHER)
Admission: EM | Admit: 2020-11-28 | Discharge: 2020-11-29 | Disposition: A | Discharge status: Home / Self Care | Payer: Medicare HMO | Attending: Emergency Medicine | Admitting: Emergency Medicine

## 2020-11-28 ENCOUNTER — Emergency Department (HOSPITAL_BASED_OUTPATIENT_CLINIC_OR_DEPARTMENT_OTHER): Payer: Medicare HMO

## 2020-11-28 ENCOUNTER — Other Ambulatory Visit: Payer: Self-pay

## 2020-11-28 ENCOUNTER — Encounter (HOSPITAL_BASED_OUTPATIENT_CLINIC_OR_DEPARTMENT_OTHER): Payer: Self-pay | Admitting: Emergency Medicine

## 2020-11-28 DIAGNOSIS — Z794 Long term (current) use of insulin: Secondary | ICD-10-CM | POA: Insufficient documentation

## 2020-11-28 DIAGNOSIS — M549 Dorsalgia, unspecified: Secondary | ICD-10-CM | POA: Diagnosis not present

## 2020-11-28 DIAGNOSIS — Z79899 Other long term (current) drug therapy: Secondary | ICD-10-CM | POA: Insufficient documentation

## 2020-11-28 DIAGNOSIS — Z7984 Long term (current) use of oral hypoglycemic drugs: Secondary | ICD-10-CM | POA: Insufficient documentation

## 2020-11-28 DIAGNOSIS — R0789 Other chest pain: Secondary | ICD-10-CM | POA: Insufficient documentation

## 2020-11-28 DIAGNOSIS — I700 Atherosclerosis of aorta: Secondary | ICD-10-CM | POA: Diagnosis not present

## 2020-11-28 DIAGNOSIS — I10 Essential (primary) hypertension: Secondary | ICD-10-CM | POA: Insufficient documentation

## 2020-11-28 DIAGNOSIS — R0602 Shortness of breath: Secondary | ICD-10-CM | POA: Diagnosis not present

## 2020-11-28 DIAGNOSIS — M19012 Primary osteoarthritis, left shoulder: Secondary | ICD-10-CM | POA: Diagnosis not present

## 2020-11-28 DIAGNOSIS — J45909 Unspecified asthma, uncomplicated: Secondary | ICD-10-CM | POA: Diagnosis not present

## 2020-11-28 DIAGNOSIS — E1140 Type 2 diabetes mellitus with diabetic neuropathy, unspecified: Secondary | ICD-10-CM | POA: Insufficient documentation

## 2020-11-28 DIAGNOSIS — I2510 Atherosclerotic heart disease of native coronary artery without angina pectoris: Secondary | ICD-10-CM | POA: Diagnosis not present

## 2020-11-28 DIAGNOSIS — R918 Other nonspecific abnormal finding of lung field: Secondary | ICD-10-CM | POA: Diagnosis not present

## 2020-11-28 DIAGNOSIS — Z90710 Acquired absence of both cervix and uterus: Secondary | ICD-10-CM | POA: Diagnosis not present

## 2020-11-28 DIAGNOSIS — Z7982 Long term (current) use of aspirin: Secondary | ICD-10-CM | POA: Insufficient documentation

## 2020-11-28 DIAGNOSIS — R079 Chest pain, unspecified: Secondary | ICD-10-CM | POA: Diagnosis not present

## 2020-11-28 LAB — BASIC METABOLIC PANEL
Anion gap: 13 (ref 5–15)
BUN: 17 mg/dL (ref 8–23)
CO2: 27 mmol/L (ref 22–32)
Calcium: 10.1 mg/dL (ref 8.9–10.3)
Chloride: 95 mmol/L — ABNORMAL LOW (ref 98–111)
Creatinine, Ser: 0.84 mg/dL (ref 0.44–1.00)
GFR, Estimated: >60 mL/min (ref >60)
Glucose, Bld: 199 mg/dL — ABNORMAL HIGH (ref 70–99)
Potassium: 3.9 mmol/L (ref 3.5–5.1)
Sodium: 135 mmol/L (ref 135–145)

## 2020-11-28 LAB — TROPONIN I (HIGH SENSITIVITY)
Troponin I (High Sensitivity): 2 ng/L (ref <18)
Troponin I (High Sensitivity): 2 ng/L (ref <18)

## 2020-11-28 LAB — BRAIN NATRIURETIC PEPTIDE: B Natriuretic Peptide: 20.5 pg/mL (ref 0.0–100.0)

## 2020-11-28 LAB — CBC
HCT: 39.8 % (ref 36.0–46.0)
Hemoglobin: 13.9 g/dL (ref 12.0–15.0)
MCH: 31.2 pg (ref 26.0–34.0)
MCHC: 34.9 g/dL (ref 30.0–36.0)
MCV: 89.4 fL (ref 80.0–100.0)
Platelets: 288 10*3/uL (ref 150–400)
RBC: 4.45 MIL/uL (ref 3.87–5.11)
RDW: 12.1 % (ref 11.5–15.5)
WBC: 8.4 10*3/uL (ref 4.0–10.5)
nRBC: 0 % (ref 0.0–0.2)

## 2020-11-28 MED ORDER — IOHEXOL 350 MG/ML SOLN
100.0000 mL | Freq: Once | INTRAVENOUS | Status: AC | PRN
  Administered 2020-11-28: 100 mL via INTRAVENOUS

## 2020-11-28 MED ORDER — OMEPRAZOLE 20 MG PO CPDR: 20.0000 mg | DELAYED_RELEASE_CAPSULE | Freq: Every day | ORAL | 1 refills | Status: DC

## 2020-11-28 NOTE — ED Triage Notes (Signed)
Reports central chest pain that is steady and goes through to her back.  Reports it has been going on since Tuesday.  Reports it got better but always starts back during the night.  Denies having any pain currently but now reports having a cold feeling from base of neck down back and in feet.

## 2020-11-28 NOTE — Discharge Instructions (Addendum)
You were evaluated in the Emergency Department and after careful evaluation, we did not find any emergent condition requiring admission or further testing in the hospital.  Your exam/testing today was overall reassuring.  Symptoms may be related to indigestion or acid reflux.  Please take the medication prescribed as directed and follow-up closely with your primary care doctor.  We did find some stones in your gallbladder but they may not be related to your pain.  Please inform your primary care doctor of this finding.  Please return to the Emergency Department if you experience any worsening of your condition.  Thank you for allowing [place] to be a part of your care.
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
## 2020-11-28 NOTE — ED Provider Notes (Signed)
  Provider Note MRN:  [id number]  Arrival date & time: 11/28/20    ED Course and Medical Decision Making  Assumed care from Dr. Ralene Bathe at shift change.  CTA is reassuring, cholelithiasis noted.  On my evaluation patient has no right upper quadrant pain or tenderness, she is feeling much better, the pain was located in the lower chest/epigastrium.  Favoring acid reflux or indigestion.  Appropriate for discharge.  Procedures  Final Clinical Impressions(s) / ED Diagnoses     ICD-10-CM   1. Chest pain, unspecified type  R07.9     ED Discharge Orders         Ordered    omeprazole (PRILOSEC) 20 MG capsule  Daily        11/28/20 2352            Discharge Instructions     You were evaluated in the Emergency Department and after careful evaluation, we did not find any emergent condition requiring admission or further testing in the hospital.  Your exam/testing today was overall reassuring.  Symptoms may be related to indigestion or acid reflux.  Please take the medication prescribed as directed and follow-up closely with your primary care doctor.  We did find some stones in your gallbladder but they may not be related to your pain.  Please inform your primary care doctor of this finding.  Please return to the Emergency Department if you experience any worsening of your condition.  Thank you for allowing [place] to be a part of your care.     Barth Kirks. [place]onia Small, [place] [email or web address]    Maudie Flakes, MD 11/28/20 [phone]

## 2020-11-29 ENCOUNTER — Other Ambulatory Visit: Payer: Self-pay | Admitting: Family Medicine

## 2020-11-29 MED ORDER — OMEPRAZOLE 20 MG PO CPDR: 20.0000 mg | DELAYED_RELEASE_CAPSULE | Freq: Every day | ORAL | 1 refills | Status: DC

## 2020-11-29 NOTE — ED Notes (Signed)
Printed and signed Rx faxed to [facility]. Per pts request

## 2020-12-01 ENCOUNTER — Other Ambulatory Visit: Payer: Self-pay

## 2020-12-01 ENCOUNTER — Telehealth: Payer: Self-pay

## 2020-12-01 ENCOUNTER — Encounter: Payer: Self-pay | Admitting: Family Medicine

## 2020-12-01 ENCOUNTER — Ambulatory Visit (INDEPENDENT_AMBULATORY_CARE_PROVIDER_SITE_OTHER): Payer: Medicare HMO | Admitting: Family Medicine

## 2020-12-01 VITALS — BP 136/70 | HR 72 | Ht 61.0 in | Wt 178.0 lb

## 2020-12-01 DIAGNOSIS — R6889 Other general symptoms and signs: Secondary | ICD-10-CM | POA: Diagnosis not present

## 2020-12-01 DIAGNOSIS — I10 Essential (primary) hypertension: Secondary | ICD-10-CM

## 2020-12-01 DIAGNOSIS — E785 Hyperlipidemia, unspecified: Secondary | ICD-10-CM

## 2020-12-01 DIAGNOSIS — E1140 Type 2 diabetes mellitus with diabetic neuropathy, unspecified: Secondary | ICD-10-CM | POA: Diagnosis not present

## 2020-12-01 LAB — LIPID PANEL
Cholesterol: 137 mg/dL (ref 0–200)
HDL: 31.6 mg/dL — ABNORMAL LOW (ref >39.00)
Total CHOL/HDL Ratio: 4
Triglycerides: 456 mg/dL — ABNORMAL HIGH (ref 0.0–149.0)

## 2020-12-01 LAB — TSH: TSH: 1.71 u[IU]/mL (ref 0.35–4.50)

## 2020-12-01 LAB — HEPATIC FUNCTION PANEL
ALT: 10 U/L (ref 0–35)
AST: 14 U/L (ref 0–37)
Albumin: 4.3 g/dL (ref 3.5–5.2)
Alkaline Phosphatase: 33 U/L — ABNORMAL LOW (ref 39–117)
Bilirubin, Direct: 0.1 mg/dL (ref 0.0–0.3)
Total Bilirubin: 0.4 mg/dL (ref 0.2–1.2)
Total Protein: 6.6 g/dL (ref 6.0–8.3)

## 2020-12-01 LAB — POCT GLYCOSYLATED HEMOGLOBIN (HGB A1C): Hemoglobin A1C: 7.9 % — AB (ref 4.0–5.6)

## 2020-12-01 LAB — T4, FREE: Free T4: 0.86 ng/dL (ref 0.60–1.60)

## 2020-12-01 LAB — LDL CHOLESTEROL, DIRECT: Direct LDL: 52 mg/dL

## 2020-12-01 NOTE — Telephone Encounter (Signed)
-----   Message from [name], MD sent at 12/01/2020 12:55 PM EST ----- Thyroid normal.  Cholesterol stable.  Elevated triglycerides, as usual.  Keep glucose and starch intake down.  Discuss possible use of Vascepa at follow up- though suspect cost will be prohibitive.
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
## 2020-12-01 NOTE — Patient Instructions (Addendum)
Food Choices for Gastroesophageal Reflux Disease, Adult When you have gastroesophageal reflux disease (GERD), the foods you eat and your eating habits are very important. Choosing the right foods can help ease the discomfort of GERD. Consider working with a dietitian to help you make healthy food choices. What are tips for following this plan? Reading food labels  Look for foods that are low in saturated fat. Foods that have less than 5% of daily value (DV) of fat and 0 g of trans fats may help with your symptoms. Cooking  Cook foods using methods other than frying. This may include baking, steaming, grilling, or broiling. These are all methods that do not need a lot of fat for cooking.  To add flavor, try to use herbs that are low in spice and acidity. Meal planning  Choose healthy foods that are low in fat, such as fruits, vegetables, whole grains, low-fat dairy products, lean meats, fish, and poultry.  Eat frequent, small meals instead of three large meals each day. Eat your meals slowly, in a relaxed setting. Avoid bending over or lying down until 2-3 hours after eating.  Limit high-fat foods such as fatty meats or fried foods.  Limit your intake of fatty foods, such as oils, butter, and shortening.  Avoid the following as told by your health care provider: ? Foods that cause symptoms. These may be different for different people. Keep a food diary to keep track of foods that cause symptoms. ? Alcohol. ? Drinking large amounts of liquid with meals. ? Eating meals during the 2-3 hours before bed.   Lifestyle  Maintain a healthy weight. Ask your health care provider what weight is healthy for you. If you need to lose weight, work with your health care provider to do so safely.  Exercise for at least 30 minutes on 5 or more days each week, or as told by your health care provider.  Avoid wearing clothes that fit tightly around your waist and chest.  Do not use any products that  contain nicotine or tobacco. These products include cigarettes, chewing tobacco, and vaping devices, such as e-cigarettes. If you need help quitting, ask your health care provider.  Sleep with the head of your bed raised. Use a wedge under the mattress or blocks under the bed frame to raise the head of the bed.  Chew sugar-free gum after mealtimes. What foods should I eat? Eat a healthy, well-balanced diet of fruits, vegetables, whole grains, low-fat dairy products, lean meats, fish, and poultry. Each person is different. Foods that may trigger symptoms in one person may not trigger any symptoms in another person. Work with your health care provider to identify foods that are safe for you. The items listed above may not be a complete list of recommended foods and beverages. Contact a dietitian for more information.   What foods should I avoid? Limiting some of these foods may help manage the symptoms of GERD. Everyone is different. Consult a dietitian or your health care provider to help you identify the exact foods to avoid, if any. Fruits Any fruits prepared with added fat. Any fruits that cause symptoms. For some people this may include citrus fruits, such as oranges, grapefruit, pineapple, and lemons. Vegetables Deep-fried vegetables. French fries. Any vegetables prepared with added fat. Any vegetables that cause symptoms. For some people, this may include tomatoes and tomato products, chili peppers, onions and garlic, and horseradish. Grains Pastries or quick breads with added fat. Meats and other proteins High-fat   meats, such as fatty beef or pork, hot dogs, ribs, ham, sausage, salami, and bacon. Fried meat or protein, including fried fish and fried chicken. Nuts and nut butters, in large amounts. Dairy Whole milk and chocolate milk. Sour cream. Cream. Ice cream. Cream cheese. Milkshakes. Fats and oils Butter. Margarine. Shortening. Ghee. Beverages Coffee and tea, with or without  caffeine. Carbonated beverages. Sodas. Energy drinks. Fruit juice made with acidic fruits, such as orange or grapefruit. Tomato juice. Alcoholic drinks. Sweets and desserts Chocolate and cocoa. Donuts. Seasonings and condiments Pepper. Peppermint and spearmint. Added salt. Any condiments, herbs, or seasonings that cause symptoms. For some people, this may include curry, hot sauce, or vinegar-based salad dressings. The items listed above may not be a complete list of foods and beverages to avoid. Contact a dietitian for more information. Questions to ask your health care provider Diet and lifestyle changes are usually the first steps that are taken to manage symptoms of GERD. If diet and lifestyle changes do not improve your symptoms, talk with your health care provider about taking medicines. Where to find more information  International Foundation for Gastrointestinal Disorders: aboutgerd.org Summary  When you have gastroesophageal reflux disease (GERD), food and lifestyle choices may be very helpful in easing the discomfort of GERD.  Eat frequent, small meals instead of three large meals each day. Eat your meals slowly, in a relaxed setting. Avoid bending over or lying down until 2-3 hours after eating.  Limit high-fat foods such as fatty meats or fried foods. This information is not intended to replace advice given to you by your health care provider. Make sure you discuss any questions you have with your health care provider. Document Revised: 05/19/2020 Document Reviewed: 05/19/2020 Elsevier Patient Education  [place]. Diabetes Mellitus and Nutrition, Adult When you have diabetes, or diabetes mellitus, it is very important to have healthy eating habits because your blood sugar (glucose) levels are greatly affected by what you eat and drink. Eating healthy foods in the right amounts, at about the same times every day, can help you:  Control your blood glucose.  Lower your  risk of heart disease.  Improve your blood pressure.  Reach or maintain a healthy weight. What can affect my meal plan? Every person with diabetes is different, and each person has different needs for a meal plan. Your health care provider may recommend that you work with a dietitian to make a meal plan that is best for you. Your meal plan may vary depending on factors such as:  The calories you need.  The medicines you take.  Your weight.  Your blood glucose, blood pressure, and cholesterol levels.  Your activity level.  Other health conditions you have, such as heart or kidney disease. How do carbohydrates affect me? Carbohydrates, also called carbs, affect your blood glucose level more than any other type of food. Eating carbs naturally raises the amount of glucose in your blood. Carb counting is a method for keeping track of how many carbs you eat. Counting carbs is important to keep your blood glucose at a healthy level, especially if you use insulin or take certain oral diabetes medicines. It is important to know how many carbs you can safely have in each meal. This is different for every person. Your dietitian can help you calculate how many carbs you should have at each meal and for each snack. How does alcohol affect me? Alcohol can cause a sudden decrease in blood glucose (hypoglycemia), especially if  you use insulin or take certain oral diabetes medicines. Hypoglycemia can be a life-threatening condition. Symptoms of hypoglycemia, such as sleepiness, dizziness, and confusion, are similar to symptoms of having too much alcohol.  Do not drink alcohol if: ? Your health care provider tells you not to drink. ? You are pregnant, may be pregnant, or are planning to become pregnant.  If you drink alcohol: ? Do not drink on an empty stomach. ? Limit how much you use to:  0-1 drink a day for women.  0-2 drinks a day for men. ? Be aware of how much alcohol is in your drink. In the  [place] one drink equals one 12 oz bottle of beer (355 mL), one 5 oz glass of wine (148 mL), or one 1 oz glass of hard liquor (44 mL). ? Keep yourself hydrated with water, diet soda, or unsweetened iced tea.  Keep in mind that regular soda, juice, and other mixers may contain a lot of sugar and must be counted as carbs. What are tips for following this plan? Reading food labels  Start by checking the serving size on the "Nutrition Facts" label of packaged foods and drinks. The amount of calories, carbs, fats, and other nutrients listed on the label is based on one serving of the item. Many items contain more than one serving per package.  Check the total grams (g) of carbs in one serving. You can calculate the number of servings of carbs in one serving by dividing the total carbs by 15. For example, if a food has 30 g of total carbs per serving, it would be equal to 2 servings of carbs.  Check the number of grams (g) of saturated fats and trans fats in one serving. Choose foods that have a low amount or none of these fats.  Check the number of milligrams (mg) of salt (sodium) in one serving. Most people should limit total sodium intake to less than 2,300 mg per day.  Always check the nutrition information of foods labeled as "low-fat" or "nonfat." These foods may be higher in added sugar or refined carbs and should be avoided.  Talk to your dietitian to identify your daily goals for nutrients listed on the label. Shopping  Avoid buying canned, pre-made, or processed foods. These foods tend to be high in fat, sodium, and added sugar.  Shop around the outside edge of the grocery store. This is where you will most often find fresh fruits and vegetables, bulk grains, fresh meats, and fresh dairy. Cooking  Use low-heat cooking methods, such as baking, instead of high-heat cooking methods like deep frying.  Cook using healthy oils, such as olive, canola, or sunflower oil.  Avoid cooking with  butter, cream, or high-fat meats. Meal planning  Eat meals and snacks regularly, preferably at the same times every day. Avoid going long periods of time without eating.  Eat foods that are high in fiber, such as fresh fruits, vegetables, beans, and whole grains. Talk with your dietitian about how many servings of carbs you can eat at each meal.  Eat 4-6 oz (112-168 g) of lean protein each day, such as lean meat, chicken, fish, eggs, or tofu. One ounce (oz) of lean protein is equal to: ? 1 oz (28 g) of meat, chicken, or fish. ? 1 egg. ?  cup (62 g) of tofu.  Eat some foods each day that contain healthy fats, such as avocado, nuts, seeds, and fish.   What foods should I  eat? Fruits Berries. Apples. Oranges. Peaches. Apricots. Plums. Grapes. Mango. Papaya. Pomegranate. Kiwi. Cherries. Vegetables Lettuce. Spinach. Leafy greens, including kale, chard, collard greens, and mustard greens. Beets. Cauliflower. Cabbage. Broccoli. Carrots. Green beans. Tomatoes. Peppers. Onions. Cucumbers. Brussels sprouts. Grains Whole grains, such as whole-wheat or whole-grain bread, crackers, tortillas, cereal, and pasta. Unsweetened oatmeal. Quinoa. Brown or wild rice. Meats and other proteins Seafood. Poultry without skin. Lean cuts of poultry and beef. Tofu. Nuts. Seeds. Dairy Low-fat or fat-free dairy products such as milk, yogurt, and cheese. The items listed above may not be a complete list of foods and beverages you can eat. Contact a dietitian for more information. What foods should I avoid? Fruits Fruits canned with syrup. Vegetables Canned vegetables. Frozen vegetables with butter or cream sauce. Grains Refined white flour and flour products such as bread, pasta, snack foods, and cereals. Avoid all processed foods. Meats and other proteins Fatty cuts of meat. Poultry with skin. Breaded or fried meats. Processed meat. Avoid saturated fats. Dairy Full-fat yogurt, cheese, or  milk. Beverages Sweetened drinks, such as soda or iced tea. The items listed above may not be a complete list of foods and beverages you should avoid. Contact a dietitian for more information. Questions to ask a health care provider  Do I need to meet with a diabetes educator?  Do I need to meet with a dietitian?  What number can I call if I have questions?  When are the best times to check my blood glucose? Where to find more information:  American Diabetes Association: diabetes.org  Academy of Nutrition and Dietetics: www.eatright.[employer] of Diabetes and Digestive and Kidney Diseases: [employer]  Association of Diabetes Care and Education Specialists: www.diabeteseducator.org Summary  It is important to have healthy eating habits because your blood sugar (glucose) levels are greatly affected by what you eat and drink.  A healthy meal plan will help you control your blood glucose and maintain a healthy lifestyle.  Your health care provider may recommend that you work with a dietitian to make a meal plan that is best for you.  Keep in mind that carbohydrates (carbs) and alcohol have immediate effects on your blood glucose levels. It is important to count carbs and to use alcohol carefully. This information is not intended to replace advice given to you by your health care provider. Make sure you discuss any questions you have with your health care provider. Document Revised: 10/16/2019 Document Reviewed: 10/16/2019 Elsevier Patient Education  2021 [employer].

## 2020-12-01 NOTE — Telephone Encounter (Signed)
Informed patient of results and recommendations.  Refuses vascepa therapy at this time.

## 2020-12-15 ENCOUNTER — Other Ambulatory Visit: Payer: Self-pay | Admitting: Family Medicine

## 2020-12-16 ENCOUNTER — Other Ambulatory Visit: Payer: Self-pay

## 2020-12-16 MED ORDER — FENOFIBRATE 160 MG PO TABS: 160.0000 mg | ORAL_TABLET | Freq: Every day | ORAL | 3 refills | Status: DC

## 2020-12-17 ENCOUNTER — Other Ambulatory Visit: Payer: Self-pay | Admitting: Family Medicine

## 2020-12-27 ENCOUNTER — Other Ambulatory Visit: Payer: Self-pay | Admitting: Family Medicine

## 2021-01-01 ENCOUNTER — Telehealth: Payer: Self-pay | Admitting: Family Medicine

## 2021-01-01 NOTE — Telephone Encounter (Signed)
Patient called wanting to have Dr. Elease Hashimoto to complete a Handicap Placard.  I placed the form in Dr's Folder  Call the patient for pick at [phone]

## 2021-01-02 NOTE — Telephone Encounter (Signed)
I did not see a handicap form in my folder.  Make sure this gets into the red folder on my desk.  Thanks

## 2021-01-06 NOTE — Telephone Encounter (Signed)
PCP completed permanent handicapped placard for the pt.  I left a detailed message at the pts cell number stating this was left at the front desk for pick up.

## 2021-02-02 ENCOUNTER — Telehealth: Payer: Self-pay | Admitting: Family Medicine

## 2021-02-02 NOTE — Telephone Encounter (Signed)
Spoke to patient to  schedule Medicare Annual Wellness Visit (AWV) either virtually or in office.  Patient going out of town will call back to schedule   Last AWV no information  please schedule at anytime with [facility] Nurse Health Advisor 1 or 2   This should be a 45 minute visit.

## 2021-02-25 ENCOUNTER — Other Ambulatory Visit: Payer: Self-pay | Admitting: Family Medicine

## 2021-03-10 ENCOUNTER — Telehealth: Payer: Self-pay | Admitting: Pharmacist

## 2021-03-10 NOTE — Progress Notes (Signed)
Chronic Care Management Pharmacy Assistant   Name: Mary Caldwell  MRN: [id number] DOB: [birth year]  Reason for Encounter: Disease State For DM.   Conditions to be addressed/monitored: Hypertension, Hyperlipidemia, Diabetes and Asthma   Recent office visits:  12/01/20 [name], MD For hospital Follow-Up. Labs drawn. No medication changes. 11/12/20 Burchette, Alinda Sierras, MD. For hearing loss due to cerumen impaction.  COMPLETED Cephalexin 500 mg 3 times daily for 7 days. 11/03/20 Burchette, Alinda Sierras, MD. For Dysuria.STARTED Cephalexin 500 mg 3 times daily for 7 days.  Recent consult visits:  None since 09/16/20  Hospital visits:  11/28/20 [facility] (2 hours) Maudie Flakes, MD. For chest pain. er note: Labs were drawn, EKG, CT, and X-rays were preformed. STARTED omeprazole 20 mg daily.   Medications: Outpatient Encounter Medications as of 03/10/2021  Medication Sig  . albuterol (VENTOLIN HFA) 108 (90 Base) MCG/ACT inhaler Inhale 2 puffs into the lungs every 4 (four) hours as needed for wheezing or shortness of breath (or coughing).  Marland Kitchen aspirin EC 81 MG tablet Take 81 mg by mouth daily.  . fenofibrate 160 MG tablet Take 1 tablet (160 mg total) by mouth daily.  Marland Kitchen glimepiride (AMARYL) 4 MG tablet TAKE 1 TABLET BY MOUTH IN THE MORNING  . insulin glargine, 2 Unit Dial, (TOUJEO MAX SOLOSTAR) 300 UNIT/ML Solostar Pen Take 20 units [place] once daily at bedtime (Patient taking differently: Inject 30 Units into the skin daily.)  . metFORMIN (GLUCOPHAGE) 500 MG tablet TAKE 2 TABLETS BY MOUTH TWICE DAILY WITH FOOD  . omeprazole (PRILOSEC) 20 MG capsule Take 1 capsule (20 mg total) by mouth daily.  . rosuvastatin (CRESTOR) 20 MG tablet TAKE 1 TABLET BY MOUTH AT BEDTIME   No facility-administered encounter medications on file as of 03/10/2021.    Recent Relevant Labs: Lab Results  Component Value Date/Time   HGBA1C 7.9 (A) 12/01/2020 09:10 AM   HGBA1C 6.9 (A)  06/24/2020 08:42 AM   HGBA1C 7.9 (H) 08/15/2019 02:37 PM   HGBA1C 7.7 (H) 01/24/2017 10:03 AM   MICROALBUR <0.7 09/13/2018 05:07 PM   MICROALBUR 1.6 01/24/2017 10:03 AM    Kidney Function Lab Results  Component Value Date/Time   CREATININE 0.84 11/28/2020 05:04 PM   CREATININE 0.68 08/15/2019 02:37 PM   GFR 84.77 08/15/2019 02:37 PM   GFRNONAA >60 11/28/2020 05:04 PM   GFRAA >60 06/08/2019 01:40 AM    . Current antihyperglycemic regimen:  o Metformin 500 mg o Glimepiride 4 mg o Toujeo Max Solostar 300 unit/ml solostar pen take 20 units [place] once daily at bedtime.   . What recent interventions/DTPs have been made to improve glycemic control:  o None.  . Have there been any recent hospitalizations or ED visits since last visit with CPP?  o 11/28/20 [facility] (2 hours) Maudie Flakes, MD. For chest pain. er note: Labs were drawn, EKG, CT, and X-rays were preformed. STARTED omeprazole 20 mg daily.   Adherence Review: Is the patient currently on a STATIN medication? Rosuvastatin 20 mg  Is the patient currently on ACE/ARB medication? No.  Does the patient have >5 day gap between last estimated fill dates? Per misc rpts, no.  Star Rating Drugs: Glimepiride 4 mg 90 DS 12/17/20  Metformin 500 mg 90 DS 12/29/20 Rosuvastatin 20 mg 90 DS 12/01/20  Third unsuccessful telephone outreach was attempted today. The patient was referred to the pharmacist for assistance with care management and care  coordination.   [place], [place] Pharmacist Assistant [phone]

## 2021-03-20 ENCOUNTER — Ambulatory Visit (HOSPITAL_COMMUNITY)
Admission: EM | Admit: 2021-03-20 | Discharge: 2021-03-20 | Disposition: A | Discharge status: Home / Self Care | Payer: Medicare HMO | Attending: Medical Oncology | Admitting: Medical Oncology

## 2021-03-20 ENCOUNTER — Other Ambulatory Visit: Payer: Self-pay

## 2021-03-20 ENCOUNTER — Encounter (HOSPITAL_COMMUNITY): Payer: Self-pay

## 2021-03-20 DIAGNOSIS — S61216A Laceration without foreign body of right little finger without damage to nail, initial encounter: Secondary | ICD-10-CM

## 2021-03-20 MED ORDER — DOXYCYCLINE HYCLATE 100 MG PO CAPS: 100.0000 mg | ORAL_CAPSULE | Freq: Two times a day (BID) | ORAL | 0 refills | Status: DC

## 2021-03-20 MED ORDER — LIDOCAINE HCL 2 % IJ SOLN
INTRAMUSCULAR | Status: AC
  Filled 2021-03-20: qty 20

## 2021-03-20 NOTE — ED Triage Notes (Signed)
Pt presents with right pinky finger laceration from a broken glass holiday ornament today.
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
## 2021-03-25 ENCOUNTER — Ambulatory Visit (HOSPITAL_COMMUNITY)
Admission: EM | Admit: 2021-03-25 | Discharge: 2021-03-25 | Disposition: A | Discharge status: Home / Self Care | Payer: Medicare HMO

## 2021-03-30 ENCOUNTER — Other Ambulatory Visit: Payer: Self-pay | Admitting: Family Medicine

## 2021-04-01 ENCOUNTER — Other Ambulatory Visit: Payer: Self-pay

## 2021-04-01 ENCOUNTER — Encounter (HOSPITAL_COMMUNITY): Payer: Self-pay | Admitting: Emergency Medicine

## 2021-04-01 ENCOUNTER — Ambulatory Visit (HOSPITAL_COMMUNITY)
Admission: EM | Admit: 2021-04-01 | Discharge: 2021-04-01 | Disposition: A | Discharge status: Home / Self Care | Payer: Medicare HMO

## 2021-04-01 DIAGNOSIS — Z4802 Encounter for removal of sutures: Secondary | ICD-10-CM | POA: Diagnosis not present

## 2021-04-01 NOTE — ED Triage Notes (Signed)
Pt presents for suture removal that were placed on 03/20/21. Denies any pain or signed of infection.

## 2021-04-06 ENCOUNTER — Other Ambulatory Visit: Payer: Self-pay | Admitting: Family Medicine

## 2021-04-21 ENCOUNTER — Telehealth: Payer: Self-pay | Admitting: Family Medicine

## 2021-04-21 NOTE — Telephone Encounter (Signed)
Left message for patient to call back and schedule Medicare Annual Wellness Visit (AWV) either virtually or in office.   AWV-I PER [place] 05/22/12-  please schedule at anytime with [facility] Nurse Health Advisor 1 or 2   This should be a 45 minute visit.

## 2021-05-05 ENCOUNTER — Telehealth: Payer: Self-pay | Admitting: Pharmacist

## 2021-05-05 NOTE — Chronic Care Management (AMB) (Signed)
Chronic Care Management Pharmacy Assistant   Name: Mary Caldwell  MRN: [id number] DOB: 09-02-1946   Reason for Encounter: Disease State   Conditions to be addressed/monitored: DMII  Recent office visits:  None  Recent consult visits:  None  Hospital visits:  Medication Reconciliation was completed by comparing discharge summary, patient's EMR and Pharmacy list, and upon discussion with patient.  Admitted to the hospital on 05.11.2022 due to Suture/Staple removal. Discharge date was 05.11.2022. Discharged from  [facility] care  New?Medications Started at [facility] Discharge:?? -started None  Medication Changes at Hospital Discharge: -Changed None  Medications Discontinued at Hospital Discharge: -Stopped None  Medications that remain the same after Hospital Discharge:??  -All other medications will remain the same.    Hospital visits:  Medication Reconciliation was completed by comparing discharge summary, patient's EMR and Pharmacy list, and upon discussion with patient.  Admitted to the hospital on 04.29.2022 due to Laceration. Discharge date was 04.29.2022. Discharged from [facility] care.    New?Medications Started at [facility] Discharge:?? -started the following mediation due to laceration Doxycycline Hyclate 100 mg Oral 2 times daily  Medication Changes at Hospital Discharge: -Changed None  Medications Discontinued at Hospital Discharge: -Stopped None  Medications that remain the same after Hospital Discharge:??  -All other medications will remain the same.   Medications: Outpatient Encounter Medications as of 05/05/2021  Medication Sig   albuterol (VENTOLIN HFA) 108 (90 Base) MCG/ACT inhaler Inhale 2 puffs into the lungs every 4 (four) hours as needed for wheezing or shortness of breath (or coughing).   aspirin EC 81 MG tablet Take 81 mg by mouth daily.   doxycycline (VIBRAMYCIN) 100 MG capsule Take 1 capsule (100 mg total) by mouth 2  (two) times daily.   fenofibrate 160 MG tablet Take 1 tablet (160 mg total) by mouth daily.   glimepiride (AMARYL) 4 MG tablet TAKE 1 TABLET BY MOUTH IN THE MORNING   insulin glargine, 2 Unit Dial, (TOUJEO MAX SOLOSTAR) 300 UNIT/ML Solostar Pen INJECT 20 UNITS SUBCUTANEOUSLY ONCE DAILY AT BEDTIME   metFORMIN (GLUCOPHAGE) 500 MG tablet TAKE 2 TABLETS BY MOUTH TWICE DAILY WITH FOOD   omeprazole (PRILOSEC) 20 MG capsule Take 1 capsule (20 mg total) by mouth daily.   rosuvastatin (CRESTOR) 20 MG tablet TAKE 1 TABLET BY MOUTH AT BEDTIME   No facility-administered encounter medications on file as of 05/05/2021.   Recent Relevant Labs: Lab Results  Component Value Date/Time   HGBA1C 7.9 (A) 12/01/2020 09:10 AM   HGBA1C 6.9 (A) 06/24/2020 08:42 AM   HGBA1C 7.9 (H) 08/15/2019 02:37 PM   HGBA1C 7.7 (H) 01/24/2017 10:03 AM   MICROALBUR <0.7 09/13/2018 05:07 PM   MICROALBUR 1.6 01/24/2017 10:03 AM    Kidney Function Lab Results  Component Value Date/Time   CREATININE 0.84 11/28/2020 05:04 PM   CREATININE 0.68 08/15/2019 02:37 PM   GFR 84.77 08/15/2019 02:37 PM   GFRNONAA >60 11/28/2020 05:04 PM   GFRAA >60 06/08/2019 01:40 AM    Current antihyperglycemic regimen:  Metformin 500 mg: two tablets tice daily with food. Insulin glargine, 2 Unit Dial, (TOUJEO MAX SOLOSTAR) 300 UNIT/ML Solostar Pen- 20 units inject daily What recent interventions/DTPs have been made to improve glycemic control:  None Have there been any recent hospitalizations or ED visits since last visit with CPP? Yes Patient denies hypoglycemic symptoms, including  Unknown Patient denies hyperglycemic symptoms, including  Unknown How often are you checking your blood sugar?  What are  your blood sugars ranging?  Fasting:  Before meals:  After meals:  Bedtime:  During the week, how often does your blood glucose drop below 70?  Are you checking your feet daily/regularly?   Adherence Review: Is the patient currently on a  STATIN medication? Yes Is the patient currently on ACE/ARB medication? No Does the patient have >5 day gap between last estimated fill dates? No   Star Rating Drugs: Medication Dispensed  Quantity Pharmacy  Rosuvastatin 20 mg 04.06.2022 [facility]  Metformin 500 mg 05.16.2022 [facility]   Attempted on several occasions to contact patient for general adherence call. Patient has not returned calls.   Maia Breslow, [place] Pharmacist Assistant [phone]

## 2021-05-12 ENCOUNTER — Other Ambulatory Visit: Payer: Self-pay

## 2021-05-12 ENCOUNTER — Ambulatory Visit (INDEPENDENT_AMBULATORY_CARE_PROVIDER_SITE_OTHER): Payer: Medicare HMO | Admitting: Family Medicine

## 2021-05-12 ENCOUNTER — Encounter: Payer: Self-pay | Admitting: Family Medicine

## 2021-05-12 VITALS — BP 144/70 | HR 72 | Temp 98.4°F | Wt 181.4 lb

## 2021-05-12 DIAGNOSIS — E1140 Type 2 diabetes mellitus with diabetic neuropathy, unspecified: Secondary | ICD-10-CM | POA: Diagnosis not present

## 2021-05-12 DIAGNOSIS — R030 Elevated blood-pressure reading, without diagnosis of hypertension: Secondary | ICD-10-CM | POA: Diagnosis not present

## 2021-05-12 DIAGNOSIS — H9202 Otalgia, left ear: Secondary | ICD-10-CM

## 2021-05-12 LAB — POCT GLYCOSYLATED HEMOGLOBIN (HGB A1C): Hemoglobin A1C: 7.3 % — AB (ref 4.0–5.6)
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
## 2021-05-12 NOTE — Patient Instructions (Signed)
Keep daily sodium intake < 2,400 mg   Lose some weight!  Keep sugars and starches down.  Let's plan on 3 month follow up.

## 2021-05-18 DIAGNOSIS — K006 Disturbances in tooth eruption: Secondary | ICD-10-CM | POA: Diagnosis not present

## 2021-05-27 ENCOUNTER — Telehealth: Payer: Self-pay | Admitting: Family Medicine

## 2021-05-27 NOTE — Telephone Encounter (Signed)
Left message for patient to call back and schedule Medicare Annual Wellness Visit (AWV) either virtually or in office.   AWV-I PER [place] 05/22/12  please schedule at anytime with [facility] Nurse Health Advisor 1 or 2   This should be a 45 minute visit.

## 2021-06-09 ENCOUNTER — Other Ambulatory Visit: Payer: Self-pay | Admitting: Family Medicine

## 2021-06-13 ENCOUNTER — Other Ambulatory Visit: Payer: Self-pay | Admitting: Family Medicine

## 2021-06-24 ENCOUNTER — Telehealth: Payer: Self-pay | Admitting: Family Medicine

## 2021-06-24 NOTE — Telephone Encounter (Signed)
Left message for patient to call back and schedule Medicare Annual Wellness Visit (AWV) either virtually or in office.  AWV-I PER [place]MET[place] 05/22/12  please schedule at anytime with [facility] Nurse Health Advisor 1 or 2   This should be a 45 minute visit.

## 2021-07-07 ENCOUNTER — Telehealth: Payer: Medicare HMO

## 2021-07-08 ENCOUNTER — Other Ambulatory Visit: Payer: Self-pay | Admitting: Family Medicine

## 2021-07-30 ENCOUNTER — Encounter: Payer: Self-pay | Admitting: Gastroenterology

## 2021-08-17 ENCOUNTER — Telehealth: Payer: Self-pay | Admitting: Pharmacist

## 2021-08-17 NOTE — Chronic Care Management (AMB) (Signed)
Chronic Care Management Pharmacy Assistant   Name: Mary Caldwell  MRN: [id number] DOB: 1946/04/18    Reason for Encounter: Disease State/ Diabetes Assessment Call   Conditions to be addressed/monitored: DMII  Recent office visits:  05-12-2021 Mary Caldwell - Patient presented for type 2 diabetes controlled, with neuropathy and other concerns. Stopped Doxycycline Hyclate 100 mg.  Recent consult visits:  None  Hospital visits:  Medication Reconciliation was completed by comparing discharge summary, patient's EMR and Pharmacy list, and upon discussion with patient.   Admitted to the hospital on 05.11.2022 due to Suture/Staple removal. Discharge date was 05.11.2022. Discharged from  [facility] urgent care   New?Medications Started at [facility] Discharge:?? -started None   Medication Changes at Hospital Discharge: -Changed None   Medications Discontinued at Hospital Discharge: -Stopped None   Medications that remain the same after Hospital Discharge:??  -All other medications will remain the same.     Hospital visits:  Medication Reconciliation was completed by comparing discharge summary, patient's EMR and Pharmacy list, and upon discussion with patient.   Admitted to the hospital on 04.29.2022 due to Laceration. Discharge date was 04.29.2022. Discharged from [facility] urgent care.     New?Medications Started at [facility] Discharge:?? -started the following mediation due to laceration Doxycycline Hyclate 100 mg Oral 2 times daily   Medication Changes at Hospital Discharge: -Changed None   Medications Discontinued at Hospital Discharge: -Stopped None   Medications that remain the same after Hospital Discharge:??  -All other medications will remain the same.  Medications: Outpatient Encounter Medications as of 08/17/2021  Medication Sig   albuterol (VENTOLIN HFA) 108 (90 Base) MCG/ACT inhaler Inhale 2 puffs into the lungs every 4 (four) hours as needed for  wheezing or shortness of breath (or coughing).   aspirin EC 81 MG tablet Take 81 mg by mouth daily.   fenofibrate 160 MG tablet Take 1 tablet (160 mg total) by mouth daily.   glimepiride (AMARYL) 4 MG tablet TAKE 1 TABLET BY MOUTH IN THE MORNING   metFORMIN (GLUCOPHAGE) 500 MG tablet TAKE 2 TABLETS BY MOUTH TWICE DAILY WITH FOOD   omeprazole (PRILOSEC) 20 MG capsule Take 1 capsule (20 mg total) by mouth daily.   rosuvastatin (CRESTOR) 20 MG tablet TAKE 1 TABLET BY MOUTH AT BEDTIME   TOUJEO MAX SOLOSTAR 300 UNIT/ML Solostar Pen INJECT 20 UNITS SUBCUTANEOUSLY ONCE DAILY AT BEDTIME   No facility-administered encounter medications on file as of 08/17/2021.  Recent Relevant Labs: Lab Results  Component Value Date/Time   HGBA1C 7.3 (A) 05/12/2021 08:48 AM   HGBA1C 7.9 (A) 12/01/2020 09:10 AM   HGBA1C 7.9 (H) 08/15/2019 02:37 PM   HGBA1C 7.7 (H) 01/24/2017 10:03 AM   MICROALBUR <0.7 09/13/2018 05:07 PM   MICROALBUR 1.6 01/24/2017 10:03 AM    Kidney Function Lab Results  Component Value Date/Time   CREATININE 0.84 11/28/2020 05:04 PM   CREATININE 0.68 08/15/2019 02:37 PM   GFR 84.77 08/15/2019 02:37 PM   GFRNONAA >60 11/28/2020 05:04 PM   GFRAA >60 06/08/2019 01:40 AM    Current antihyperglycemic regimen:  Metformin (Glucophage) 500 mg  Glimepiride (Amaryl) 4 mg Tujeo 20 units once daily at bedtime What recent interventions/DTPs have been made to improve glycemic control:  Patient reports no changes Have there been any recent hospitalizations or ED visits since last visit with CPP? None Patient denies hypoglycemic symptoms, including Pale, Sweaty, Shaky, Hungry, Nervous/irritable, and Vision changes Patient denies hyperglycemic symptoms, including blurry vision, excessive  thirst, fatigue, polyuria, and weakness How often are you checking your blood sugar? in the morning before eating or drinking What are your blood sugars ranging?  Fasting: 139 During the week, how often does your  blood glucose drop below 70? Never Are you checking your feet daily/regularly? Patient reports she is checking them on a daily basis. Notes: Patient reports she is not really a big meat eater eats mostly fruits and salad she is active with her gardening and walking.  Adherence Review: Is the patient currently on a STATIN medication? Yes Is the patient currently on ACE/ARB medication? No Does the patient have >5 day gap between last estimated fill dates? No   Care Gaps: AWV - [facility] office aware to schedule CCM -12-25-21 Hepatitis C Screening - Overdue Zoster Vaccine - Overdue Dexa - Overdue Eye Exam - Overdue COVID Booster #3 Therapist, music) - Overdue Colonoscopy - Overdue Flu Vaccine - Overdue Foot Exam - Overdue HGB 7.3 BP 144/70  Star Rating Drugs: Rosuvastatin (Crestor) 20 mg - Last filled 02-25-2021 90 DS at [facility] Metformin (Glucophage) 500 mg - Last filled 04-06-2021 90 DS at [facility] Glimepiride (Amaryl) 4 mg - Last filled 06-01-2021 90 DS at [facility] History Gaps : Call to [place] spoke to [place] she notes Rosuvastatin (Crestor) 20 mg - Last filled 06-09-2021 90 DS at [facility] Metformin (Glucophage) 500 mg - Last filled 07-08-2021 90 DS at [place] Pharmacist Assistant [phone]

## 2021-08-18 ENCOUNTER — Telehealth: Payer: Self-pay | Admitting: Family Medicine

## 2021-08-18 NOTE — Telephone Encounter (Signed)
Left message for patient to call back and schedule Medicare Annual Wellness Visit (AWV) either virtually or in office. Left  my Herbie Drape number [phone]   AWV-I PER [place] 05/22/12 please schedule at anytime with [facility] Nurse Health Advisor 1 or 2   This should be a 45 minute visit.

## 2021-08-26 ENCOUNTER — Other Ambulatory Visit: Payer: Self-pay | Admitting: Family Medicine

## 2021-09-07 ENCOUNTER — Other Ambulatory Visit: Payer: Self-pay | Admitting: Family Medicine

## 2021-09-09 ENCOUNTER — Telehealth: Payer: Self-pay

## 2021-09-09 ENCOUNTER — Telehealth: Payer: Self-pay | Admitting: Family Medicine

## 2021-09-09 MED ORDER — ROSUVASTATIN CALCIUM 20 MG PO TABS: 20.0000 mg | ORAL_TABLET | Freq: Every day | ORAL | 0 refills | Status: DC

## 2021-09-09 NOTE — Telephone Encounter (Signed)
Pt declines to schedule 62mo f/u at this time. States she will call back to schedule.

## 2021-09-09 NOTE — Telephone Encounter (Signed)
Rx has been sent in. Spoke with the patient. She is aware that her Rx has been sent in and that the prescription number would come from the pharmacy. Nothing further needed.

## 2021-09-09 NOTE — Telephone Encounter (Signed)
Patient is requesting a refill for rosuvastatin (CRESTOR) 20 MG tablet [[id number]] to be sent to her pharmacy. She is also requesting a prescription number.  Patient would like to be contacted at [phone].  Please advise.

## 2021-09-11 ENCOUNTER — Ambulatory Visit: Payer: Medicare HMO | Admitting: Family Medicine

## 2021-09-11 ENCOUNTER — Other Ambulatory Visit: Payer: Self-pay

## 2021-09-11 ENCOUNTER — Ambulatory Visit (INDEPENDENT_AMBULATORY_CARE_PROVIDER_SITE_OTHER): Payer: Medicare HMO | Admitting: Family Medicine

## 2021-09-11 VITALS — BP 142/80 | HR 90 | Temp 98.3°F | Wt 176.2 lb

## 2021-09-11 DIAGNOSIS — E785 Hyperlipidemia, unspecified: Secondary | ICD-10-CM | POA: Diagnosis not present

## 2021-09-11 DIAGNOSIS — M5416 Radiculopathy, lumbar region: Secondary | ICD-10-CM | POA: Diagnosis not present

## 2021-09-11 DIAGNOSIS — L821 Other seborrheic keratosis: Secondary | ICD-10-CM | POA: Diagnosis not present

## 2021-09-11 DIAGNOSIS — I10 Essential (primary) hypertension: Secondary | ICD-10-CM | POA: Diagnosis not present

## 2021-09-11 DIAGNOSIS — E1140 Type 2 diabetes mellitus with diabetic neuropathy, unspecified: Secondary | ICD-10-CM | POA: Diagnosis not present

## 2021-09-11 LAB — POCT GLYCOSYLATED HEMOGLOBIN (HGB A1C): Hemoglobin A1C: 7.6 % — AB (ref 4.0–5.6)

## 2021-09-11 MED ORDER — TRAMADOL HCL 50 MG PO TABS: 50.0000 mg | ORAL_TABLET | Freq: Four times a day (QID) | ORAL | 0 refills | Status: AC | PRN
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
## 2021-09-14 ENCOUNTER — Telehealth: Payer: Self-pay | Admitting: Family Medicine

## 2021-09-14 MED ORDER — CELECOXIB 200 MG PO CAPS: 200.0000 mg | ORAL_CAPSULE | Freq: Every day | ORAL | 0 refills | Status: DC

## 2021-09-14 NOTE — Telephone Encounter (Signed)
Rx sent in. Spoke with the patient. She is aware of Dr. Erick Blinks message.

## 2021-09-14 NOTE — Telephone Encounter (Signed)
Please advise 

## 2021-09-14 NOTE — Telephone Encounter (Signed)
Patient called because the traMADol (ULTRAM) 50 MG tablet is making her vomit and she has stopped taking it. Patient needs something else sent in.  Please Send to   [place], [place] Phone:  [phone]  Fax:  [phone]        Good callback number is [phone]    Please Advise

## 2021-09-18 ENCOUNTER — Other Ambulatory Visit: Payer: Self-pay

## 2021-09-18 ENCOUNTER — Emergency Department (HOSPITAL_BASED_OUTPATIENT_CLINIC_OR_DEPARTMENT_OTHER): Payer: Medicare HMO | Admitting: Radiology

## 2021-09-18 ENCOUNTER — Emergency Department (HOSPITAL_BASED_OUTPATIENT_CLINIC_OR_DEPARTMENT_OTHER)
Admission: EM | Admit: 2021-09-18 | Discharge: 2021-09-18 | Disposition: A | Discharge status: Home / Self Care | Payer: Medicare HMO | Attending: Emergency Medicine | Admitting: Emergency Medicine

## 2021-09-18 ENCOUNTER — Encounter (HOSPITAL_BASED_OUTPATIENT_CLINIC_OR_DEPARTMENT_OTHER): Payer: Self-pay | Admitting: Emergency Medicine

## 2021-09-18 DIAGNOSIS — J45909 Unspecified asthma, uncomplicated: Secondary | ICD-10-CM | POA: Diagnosis not present

## 2021-09-18 DIAGNOSIS — Z794 Long term (current) use of insulin: Secondary | ICD-10-CM | POA: Diagnosis not present

## 2021-09-18 DIAGNOSIS — Z7984 Long term (current) use of oral hypoglycemic drugs: Secondary | ICD-10-CM | POA: Insufficient documentation

## 2021-09-18 DIAGNOSIS — Z7982 Long term (current) use of aspirin: Secondary | ICD-10-CM | POA: Diagnosis not present

## 2021-09-18 DIAGNOSIS — M545 Low back pain, unspecified: Secondary | ICD-10-CM | POA: Diagnosis not present

## 2021-09-18 DIAGNOSIS — I10 Essential (primary) hypertension: Secondary | ICD-10-CM | POA: Insufficient documentation

## 2021-09-18 DIAGNOSIS — E1140 Type 2 diabetes mellitus with diabetic neuropathy, unspecified: Secondary | ICD-10-CM | POA: Diagnosis not present

## 2021-09-18 DIAGNOSIS — M5442 Lumbago with sciatica, left side: Secondary | ICD-10-CM | POA: Insufficient documentation

## 2021-09-18 MED ORDER — HYDROCODONE-ACETAMINOPHEN 5-325 MG PO TABS: 1.0000 | ORAL_TABLET | Freq: Four times a day (QID) | ORAL | 0 refills | Status: AC | PRN

## 2021-09-18 MED ORDER — GABAPENTIN 100 MG PO CAPS: ORAL_CAPSULE | ORAL | 0 refills | Status: DC

## 2021-09-18 NOTE — Addendum Note (Signed)
Addended by: Rebecca Eaton on: 09/18/2021 01:00 PM   Modules accepted: Orders

## 2021-09-18 NOTE — Discharge Instructions (Addendum)
You came to the emergency department today to be evaluated for your back pain.  Your physical exam was reassuring.  Your symptoms are likely due to lumbar radiculopathy or sciatica.  Please take the Celebrex and gabapentin as prescribed by your primary care doctor.  I have given you prescription for Norco, please take this medication as prescribed.  Please follow-up with your primary care provider.  Get help right away if: You are not able to control when you urinate or have bowel movements (incontinence). You have: Weakness in your lower back, pelvis, buttocks, or legs that gets worse. Redness or swelling of your back. A burning sensation when you urinate.

## 2021-09-18 NOTE — ED Triage Notes (Signed)
Pt arrives to ED with c/o of left sided lower back pain x 2-3 weeks ago. The hip pain continues to worsen. She is taking Celebrex w/o pain relief. Her PCP wants her to take Gabapentin for radiculitis symptoms, ,however she has not tried Gabapentin. She denies numbness but reports some tingling in left leg.

## 2021-09-18 NOTE — Telephone Encounter (Signed)
Please advise 

## 2021-09-18 NOTE — Telephone Encounter (Signed)
Pt is calling back to let dr burchette know the celebrex is not helping her hip pain

## 2021-09-18 NOTE — ED Provider Notes (Signed)
[facility] DEPT Provider Note   CSN: [id number] Arrival date & time: 09/18/21  1339     History Chief Complaint  Patient presents with   Back Pain    Mary Caldwell is a 75 y.o. female with history of hypertension, diabetes mellitus type 2, hyperlipidemia, obesity.  Presents to emergency department complaint of left lumbar back pain.  Symptoms have been present over the last 2 and half weeks.  Pain has been constant over this time.  Patient describes pain as "achy."  Patient rates pain 10/10 on the pain scale.  Pain radiates to left lower leg.  Pain is worse with bending.  Patient has had no relief with Celebrex.  Patient was prescribed Vimpat but has not tried taking this medication.  Patient denies any fevers, chills, dysuria, hematuria, urinary urgency, numbness, weakness, saddle anesthesia, bowel or bladder dysfunction, unexpected weight loss, night sweats, history of cancer.  Patient denies any recent falls or injuries.  Patient denies any IV drug use.   Back Pain Associated symptoms: no abdominal pain, no chest pain, no dysuria, no fever, no headaches, no numbness, no pelvic pain and no weakness       Past Medical History:  Diagnosis Date   ABDOMINAL ABSCESS 12/29/2009   ASTHMA 02/11/2009   DIABETES-TYPE 2 02/11/2009   ESSENTIAL HYPERTENSION 02/11/2009   HYPERLIPIDEMIA 02/11/2009   OBESITY 02/11/2009    Patient Active Problem List   Diagnosis Date Noted   Hypoxia 04/18/2016   Obesity (BMI 30-39.9) 09/07/2013   ABDOMINAL ABSCESS 12/29/2009   Type 2 diabetes, controlled, with neuropathy ([place]) 02/11/2009   Hyperlipidemia 02/11/2009   OBESITY 02/11/2009   Essential hypertension 02/11/2009   ASTHMA 02/11/2009    Past Surgical History:  Procedure Laterality Date   [place]   steel rod left leg, now removed     OB History   No obstetric history on file.     Family History  Problem  Relation Age of Onset   Arthritis Other    Heart disease Other        grandparent   Diabetes Other        parent   Colon cancer Sister     Social History   Tobacco Use   Smoking status: Never   Smokeless tobacco: Never  Vaping Use   Vaping Use: Never used  Substance Use Topics   Alcohol use: Yes    Alcohol/week: 0.0 standard drinks    Comment: only on rare holidays   Drug use: No    Home Medications Prior to Admission medications   Medication Sig Start Date End Date Taking? Authorizing Provider  albuterol (VENTOLIN HFA) 108 (90 Base) MCG/ACT inhaler Inhale 2 puffs into the lungs every 4 (four) hours as needed for wheezing or shortness of breath (or coughing). 5/91/63   Delora Fuel, MD  aspirin EC 81 MG tablet Take 81 mg by mouth daily.    [provider]  celecoxib (CELEBREX) 200 MG capsule Take 1 capsule (200 mg total) by mouth daily. 09/14/21   Burchette, Alinda Sierras, MD  fenofibrate 160 MG tablet Take 1 tablet (160 mg total) by mouth daily. 12/16/20   Burchette, Alinda Sierras, MD  gabapentin (NEURONTIN) 100 MG capsule Take 1 tablet every 8 hours as needed. 09/18/21   Burchette, Alinda Sierras, MD  glimepiride (AMARYL) 4 MG tablet TAKE 1 TABLET BY MOUTH IN THE MORNING 12/17/20   Burchette,  Alinda Sierras, MD  metFORMIN (GLUCOPHAGE) 500 MG tablet TAKE 2 TABLETS BY MOUTH TWICE DAILY WITH FOOD 07/08/21   Burchette, Alinda Sierras, MD  omeprazole (PRILOSEC) 20 MG capsule Take 1 capsule (20 mg total) by mouth daily. 11/29/20   Maudie Flakes, MD  rosuvastatin (CRESTOR) 20 MG tablet Take 1 tablet (20 mg total) by mouth at bedtime. 09/09/21   Burchette, Alinda Sierras, MD  TOUJEO MAX SOLOSTAR 300 UNIT/ML Solostar Pen INJECT 20 UNITS SUBCUTANEOUSLY ONCE DAILY AT BEDTIME 08/26/21   Burchette, Alinda Sierras, MD    Allergies    Atorvastatin and Codeine sulfate  Review of Systems   Review of Systems  Constitutional:  Negative for chills and fever.  Eyes:  Negative for visual disturbance.  Respiratory:  Negative for  shortness of breath.   Cardiovascular:  Negative for chest pain.  Gastrointestinal:  Negative for abdominal pain, nausea and vomiting.  Genitourinary:  Negative for difficulty urinating, dysuria, enuresis, flank pain, hematuria, pelvic pain and urgency.  Musculoskeletal:  Positive for back pain. Negative for neck pain.  Skin:  Negative for color change, pallor, rash and wound.  Neurological:  Negative for dizziness, tremors, seizures, syncope, facial asymmetry, speech difficulty, weakness, light-headedness, numbness and headaches.  Psychiatric/Behavioral:  Negative for confusion.    Physical Exam Updated Vital Signs BP (!) 159/91 (BP Location: Right Arm)   Pulse 78   Temp 99.2 F (37.3 C)   Resp 18   Ht 5' (1.524 m)   Wt 79 kg   SpO2 95%   BMI 34.01 kg/m   Physical Exam Vitals and nursing note reviewed.  Constitutional:      General: She is not in acute distress.    Appearance: She is not ill-appearing, toxic-appearing or diaphoretic.  Eyes:     General: No scleral icterus.       Right eye: No discharge.        Left eye: No discharge.     Extraocular Movements: Extraocular movements intact.     Conjunctiva/sclera: Conjunctivae normal.     Pupils: Pupils are equal, round, and reactive to light.  Cardiovascular:     Rate and Rhythm: Normal rate.  Pulmonary:     Effort: Pulmonary effort is normal.  Abdominal:     Palpations: Abdomen is soft.     Tenderness: There is no abdominal tenderness.  Musculoskeletal:     Cervical back: Normal range of motion and neck supple. No swelling, edema, deformity, erythema, signs of trauma, lacerations, rigidity, spasms, torticollis, tenderness, bony tenderness or crepitus. No pain with movement. Normal range of motion.     Thoracic back: No swelling, edema, deformity, signs of trauma, lacerations, spasms, tenderness or bony tenderness.     Lumbar back: Tenderness present. No swelling, edema, deformity, signs of trauma, lacerations, spasms or  bony tenderness. Positive left straight leg raise test. Negative right straight leg raise test.     Comments: No midline tenderness reported to cervical, thoracic, or lumbar spine.  Patient has tenderness to left lumbar back.  Skin:    General: Skin is warm and dry.  Neurological:     General: No focal deficit present.     Mental Status: She is alert.     GCS: GCS eye subscore is 4. GCS verbal subscore is 5. GCS motor subscore is 6.     Cranial Nerves: No cranial nerve deficit, dysarthria or facial asymmetry.     Sensory: Sensation is intact.     Motor: No weakness, tremor, seizure  activity or pronator drift.     Coordination: Finger-Nose-Finger Test normal.     Gait: Gait is intact. Gait normal.     Comments: CN II-XII intact, equal grip strength, +5 strength to bilateral upper and lower extremities, sensation to light touch intact to bilateral upper and lower extremities.  Psychiatric:        Behavior: Behavior is cooperative.    ED Results / Procedures / Treatments   Labs (all labs ordered are listed, but only abnormal results are displayed) Labs Reviewed - No data to display  EKG None  Radiology DG Lumbar Spine Complete  Result Date: 09/18/2021 CLINICAL DATA:  Low back pain EXAM: LUMBAR SPINE - COMPLETE 4+ VIEW COMPARISON:  MRI report 07/14/2006, CT chest 11/28/2020 FINDINGS: Gallstone in the right upper quadrant. Hypoplastic left twelfth rib. Five non rib-bearing lumbar type vertebra. Grade 1 anterolisthesis L4 on L5. Vertebral body heights are maintained. Multilevel degenerative change with mild to moderate disc space narrowing at L2-L3 and L4-L5. Mild degenerative changes at L3-L4. Facet degenerative changes of the lower lumbar spine. IMPRESSION: 1. Degenerative changes. 2. No acute osseous abnormality 3. Gallstones Electronically Signed   By: Donavan Foil M.D.   On: 09/18/2021 15:25    Procedures Procedures   Medications Ordered in ED Medications - No data to  display  ED Course  I have reviewed the triage vital signs and the nursing notes.  Pertinent labs & imaging results that were available during my care of the patient were reviewed by me and considered in my medical decision making (see chart for details).    MDM Rules/Calculators/A&P                           Alert 74 year old female no acute stress, nontoxic-appearing.  Presents to ED with chief complaint of left lumbar back pain.  Pain has been constant over the last 2 weeks.  Patient has been seen for the same by her PCP, prescribed tramadol, Celebrex, and gabapentin.  Patient has not started taking gabapentin medication.  Neuro exam is reassuring.  Patient is afebrile, no reports of bowel or bladder dysfunction, saddle anesthesia, numbness or weakness.  Low suspicion for cauda equina syndrome at this time.   Low suspicion for UTI or renal calculus as patient has no dysuria, hematuria, urinary urgency.  Will prescribe patient with short course of Norco.  Patient advised to use prescribed Celebrex and gabapentin.  Patient to follow-up with primary care provider.  Discussed results, findings, treatment and follow up. Patient advised of return precautions. Patient verbalized understanding and agreed with plan.  Patient was discussed with and evaluated by Dr. Gilford Raid.   Final Clinical Impression(s) / ED Diagnoses Final diagnoses:  Acute left-sided low back pain with left-sided sciatica    Rx / DC Orders ED Discharge Orders          Ordered    HYDROcodone-acetaminophen (NORCO/VICODIN) 5-325 MG tablet  Every 6 hours PRN        09/18/21 1903             Dyann Ruddle 09/18/21 1909    Isla Pence, MD 09/18/21 2002

## 2021-09-18 NOTE — Telephone Encounter (Signed)
Spoke with the patient. She is aware of Dr. Erick Blinks message and will call back to schedule. Rx has been sent in.

## 2021-09-18 NOTE — ED Notes (Signed)
Pt endorses tramadol that was prescribed by PCP causes N/V

## 2021-09-22 ENCOUNTER — Other Ambulatory Visit: Payer: Self-pay

## 2021-09-22 ENCOUNTER — Ambulatory Visit (INDEPENDENT_AMBULATORY_CARE_PROVIDER_SITE_OTHER): Payer: Medicare HMO | Admitting: Family Medicine

## 2021-09-22 VITALS — BP 142/70 | HR 100 | Temp 98.6°F | Wt 175.2 lb

## 2021-09-22 DIAGNOSIS — M5416 Radiculopathy, lumbar region: Secondary | ICD-10-CM | POA: Diagnosis not present

## 2021-09-22 MED ORDER — HYDROCODONE-ACETAMINOPHEN 5-325 MG PO TABS: 1.0000 | ORAL_TABLET | ORAL | 0 refills | Status: DC | PRN

## 2021-09-22 NOTE — Patient Instructions (Signed)
Use the Hydrocodone as needed for severe pain  Start the Gabapentin!  The MRI has been ordered and you should be contacted  by radiology.
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
## 2021-09-23 ENCOUNTER — Telehealth: Payer: Self-pay | Admitting: Family Medicine

## 2021-09-23 NOTE — Telephone Encounter (Signed)
FYI

## 2021-09-23 NOTE — Telephone Encounter (Signed)
Pt seen dr Elease Hashimoto  09-22-2021  for hip pain and per pt dr burchette told her if MRI is far out he would call to get her something sooner. Pt MRI is sch for [place] [facility] on 10-11-2021

## 2021-09-24 NOTE — Telephone Encounter (Signed)
Spoke with the patient. She is aware of Dr. Erick Blinks message and states she is taking the gabapentin and that it has helped a lot.

## 2021-09-25 ENCOUNTER — Telehealth: Payer: Self-pay | Admitting: Pharmacist

## 2021-09-25 NOTE — Progress Notes (Signed)
Chronic Care Management Pharmacy Assistant   Name: EVERLEIGH COLCLASURE  MRN: [id number] DOB: 28-Mar-1946  Reason for Encounter: Disease State Diabetes Assessment Call    Conditions to be addressed/monitored: DMII & See if patient has any issues or questions concerning hospital discharge  Recent office visits:  09/22/21 [name], MD - Patient presented for Left Lumbar Radiculitis. Prescribed Hydrocodone- Acetaminophen  09/11/21 Burchette, Alinda Sierras, MD - Patient presented for Left Lumbar Radiculitis. Prescribed Hydrocodone- Acetaminophen  Recent consult visits:  None  Hospital visits:   Medication Reconciliation was completed by comparing discharge summary, patient's EMR and Pharmacy list, and upon discussion with patient.  Patient presented to [facility] ED on 09/29/21  due to Lumbar radiculopathy. Patient was there for 9 hours.  New?Medications Started at [facility] Discharge:?? Methylprednisolone 4 mg   Medication Changes at Hospital Discharge: -Changed  Hydrocodone - Acetaminophen 5-325 mg  Medications Discontinued at Hospital Discharge: -Stopped  None  Medications that remain the same after Hospital Discharge:??  -All other medications will remain the same.     Medication Reconciliation was completed by comparing discharge summary, patient's EMR and Pharmacy list, and upon discussion with patient.  Patient presented to [facility] ED on 09/18/21  due to Acute left sided low back pain with sciatica. Patient was there for 2 hours.   New?Medications Started at [facility] Discharge:?? Hydrocodone - Acetaminophen 5-325 mg   Medication Changes at Hospital Discharge: -Changed  None  Medications Discontinued at Hospital Discharge: -Stopped  None  Medications that remain the same after Hospital Discharge:??  -All other medications will remain the same.    Medications: Outpatient Encounter Medications as of 09/25/2021  Medication  Sig   albuterol (VENTOLIN HFA) 108 (90 Base) MCG/ACT inhaler Inhale 2 puffs into the lungs every 4 (four) hours as needed for wheezing or shortness of breath (or coughing).   aspirin EC 81 MG tablet Take 81 mg by mouth daily.   celecoxib (CELEBREX) 200 MG capsule Take 1 capsule (200 mg total) by mouth daily.   fenofibrate 160 MG tablet Take 1 tablet (160 mg total) by mouth daily.   gabapentin (NEURONTIN) 100 MG capsule Take 1 tablet every 8 hours as needed.   glimepiride (AMARYL) 4 MG tablet TAKE 1 TABLET BY MOUTH IN THE MORNING   HYDROcodone-acetaminophen (NORCO/VICODIN) 5-325 MG tablet Take 1-2 tablets by mouth every 4 (four) hours as needed for moderate pain.   metFORMIN (GLUCOPHAGE) 500 MG tablet TAKE 2 TABLETS BY MOUTH TWICE DAILY WITH FOOD   omeprazole (PRILOSEC) 20 MG capsule Take 1 capsule (20 mg total) by mouth daily.   rosuvastatin (CRESTOR) 20 MG tablet Take 1 tablet (20 mg total) by mouth at bedtime.   TOUJEO MAX SOLOSTAR 300 UNIT/ML Solostar Pen INJECT 20 UNITS SUBCUTANEOUSLY ONCE DAILY AT BEDTIME   No facility-administered encounter medications on file as of 09/25/2021.  Recent Relevant Labs: Lab Results  Component Value Date/Time   HGBA1C 7.6 (A) 09/11/2021 02:44 PM   HGBA1C 7.3 (A) 05/12/2021 08:48 AM   HGBA1C 7.9 (H) 08/15/2019 02:37 PM   HGBA1C 7.7 (H) 01/24/2017 10:03 AM   MICROALBUR <0.7 09/13/2018 05:07 PM   MICROALBUR 1.6 01/24/2017 10:03 AM    Kidney Function Lab Results  Component Value Date/Time   CREATININE 0.84 11/28/2020 05:04 PM   CREATININE 0.68 08/15/2019 02:37 PM   GFR 84.77 08/15/2019 02:37 PM   GFRNONAA >60 11/28/2020 05:04 PM   GFRAA >60 06/08/2019 01:40 AM  Current antihyperglycemic regimen:  Metformin (Glucophage) 500 mg  Glimepiride (Amaryl) 4 mg   Unable to reach patient for Assessment and to inquire if she has any concerns/questions concerning hospital discharge.  Care Gaps: AWV - office aware to schedule CCM -2/23 Hepatitis C  Screening - Overdue Zoster Vaccine - Overdue Dexa - Overdue Eye Exam - Overdue COVID Booster #3 Therapist, music) - Overdue Colonoscopy - Overdue Flu Vaccine - Overdue Foot Exam - Overdue BP 144/70 Lab Results  Component Value Date   HGBA1C 7.6 (A) 09/11/2021    Star Rating Drugs: Rosuvastatin (Crestor) 20 mg - Last filled 09/09/2021 90 DS at [facility] Metformin (Glucophage) 500 mg - Last filled 07/08/2021 90 DS at [facility] Glimepiride (Amaryl) 4 mg - Last filled 09/07/2021 90 DS at [place]  09/25/21 attempted to reach patient left vm 09/30/21 attempted to reach  11/11 attempted to reach spoke to son whom advised he would let her know to give me a call 11/14 attempted to reach patient again/ did not receive call back  [place] Pharmacist Assistant [phone]

## 2021-09-28 NOTE — Telephone Encounter (Signed)
Patient is requesting a phone call back regarding her visit on 11/20. Patient is also stating that she's still not feeling well and is starting to get nauseous.  Patient could be contacted at [phone].  Please advise.

## 2021-09-28 NOTE — Telephone Encounter (Signed)
Noted. Patient stated she does not need a refill at this time.

## 2021-09-28 NOTE — Telephone Encounter (Signed)
Spoke with the patient she wanted to know if we could get her MRI moved to a sooner date. I explained that unfortunately there is not much we can do to get her appointment moved if that is the earliest appointment they had. I recommend that she is if they had a cancellation list. She asked that I send a message back to Dr. Elease Hashimoto and let him know that the pain is getting much worse. Now when the pain gets really bad she starts to sweat.

## 2021-09-29 ENCOUNTER — Emergency Department (HOSPITAL_COMMUNITY): Payer: Medicare HMO

## 2021-09-29 ENCOUNTER — Emergency Department (HOSPITAL_COMMUNITY)
Admission: EM | Admit: 2021-09-29 | Discharge: 2021-09-29 | Disposition: A | Discharge status: Home / Self Care | Payer: Medicare HMO | Attending: Student | Admitting: Student

## 2021-09-29 ENCOUNTER — Encounter (HOSPITAL_COMMUNITY): Payer: Self-pay

## 2021-09-29 DIAGNOSIS — M5416 Radiculopathy, lumbar region: Secondary | ICD-10-CM | POA: Insufficient documentation

## 2021-09-29 DIAGNOSIS — J45909 Unspecified asthma, uncomplicated: Secondary | ICD-10-CM | POA: Insufficient documentation

## 2021-09-29 DIAGNOSIS — Z7984 Long term (current) use of oral hypoglycemic drugs: Secondary | ICD-10-CM | POA: Diagnosis not present

## 2021-09-29 DIAGNOSIS — Z79899 Other long term (current) drug therapy: Secondary | ICD-10-CM | POA: Insufficient documentation

## 2021-09-29 DIAGNOSIS — M545 Low back pain, unspecified: Secondary | ICD-10-CM | POA: Diagnosis not present

## 2021-09-29 DIAGNOSIS — E1140 Type 2 diabetes mellitus with diabetic neuropathy, unspecified: Secondary | ICD-10-CM | POA: Insufficient documentation

## 2021-09-29 DIAGNOSIS — Z7982 Long term (current) use of aspirin: Secondary | ICD-10-CM | POA: Diagnosis not present

## 2021-09-29 DIAGNOSIS — I10 Essential (primary) hypertension: Secondary | ICD-10-CM | POA: Insufficient documentation

## 2021-09-29 LAB — CBC WITH DIFFERENTIAL/PLATELET
Abs Immature Granulocytes: 0.05 10*3/uL (ref 0.00–0.07)
Basophils Absolute: 0 10*3/uL (ref 0.0–0.1)
Basophils Relative: 0 %
Eosinophils Absolute: 0.1 10*3/uL (ref 0.0–0.5)
Eosinophils Relative: 1 %
HCT: 37.2 % (ref 36.0–46.0)
Hemoglobin: 12.8 g/dL (ref 12.0–15.0)
Immature Granulocytes: 1 %
Lymphocytes Relative: 24 %
Lymphs Abs: 2.1 10*3/uL (ref 0.7–4.0)
MCH: 31.1 pg (ref 26.0–34.0)
MCHC: 34.4 g/dL (ref 30.0–36.0)
MCV: 90.5 fL (ref 80.0–100.0)
Monocytes Absolute: 0.5 10*3/uL (ref 0.1–1.0)
Monocytes Relative: 6 %
Neutro Abs: 6.1 10*3/uL (ref 1.7–7.7)
Neutrophils Relative %: 68 %
Platelets: 236 10*3/uL (ref 150–400)
RBC: 4.11 MIL/uL (ref 3.87–5.11)
RDW: 12.6 % (ref 11.5–15.5)
WBC: 9 10*3/uL (ref 4.0–10.5)
nRBC: 0 % (ref 0.0–0.2)

## 2021-09-29 LAB — BASIC METABOLIC PANEL
Anion gap: 7 (ref 5–15)
BUN: 13 mg/dL (ref 8–23)
CO2: 28 mmol/L (ref 22–32)
Calcium: 9.1 mg/dL (ref 8.9–10.3)
Chloride: 102 mmol/L (ref 98–111)
Creatinine, Ser: 0.68 mg/dL (ref 0.44–1.00)
GFR, Estimated: >60 mL/min (ref >60)
Glucose, Bld: 199 mg/dL — ABNORMAL HIGH (ref 70–99)
Potassium: 3.8 mmol/L (ref 3.5–5.1)
Sodium: 137 mmol/L (ref 135–145)

## 2021-09-29 MED ORDER — LORAZEPAM 2 MG/ML IJ SOLN: 0.5000 mg | Freq: Once | INTRAMUSCULAR | Status: DC | PRN

## 2021-09-29 MED ORDER — HYDROCODONE-ACETAMINOPHEN 5-325 MG PO TABS: 1.0000 | ORAL_TABLET | Freq: Four times a day (QID) | ORAL | 0 refills | Status: DC | PRN

## 2021-09-29 MED ORDER — METHYLPREDNISOLONE 4 MG PO TBPK: ORAL_TABLET | ORAL | 0 refills | Status: DC

## 2021-09-29 NOTE — Discharge Instructions (Addendum)
1.  Fill your prescriptions and start your Medrol Dosepak in the morning.  You may continue your Vicodin prescription 1 to 2 tablets as you were previously taking it.  Continue your Neurontin as previously taken. 2.  Call the neurosurgeons office tomorrow morning to schedule your follow-up appointment as soon as possible. 3.  Return to the emergency department if you get new or worsening weakness numbness or pain not controlled by medications.

## 2021-09-29 NOTE — ED Provider Notes (Signed)
Emergency Medicine Provider Triage Evaluation Note  Mary Caldwell , a 75 y.o. female  was evaluated in triage.  Pt complains of back pain. Began > 1 month ago. This is 4th visit between ED and PCP for similar. MR scheduled for lat November. Initially with pain down left leg and numbness however now with pain to BL legs and numbness to Bl LE. Some difficulty urinating however denies incontinence. No fever, IVDU. Taking gabapentin and Norco at home without relief  Review of Systems  Positive: Bl leg paresthesias, low back pain Negative: Fever, IVDU  Physical Exam  Pulse 75   Temp 98.1 F (36.7 C) (Oral)   Resp 18   SpO2 100%  Gen:   Awake, no distress   Resp:  Normal effort  MSK:   Moves extremities without difficulty  Other:    Medical Decision Making  Medically screening exam initiated at 8:34 AM.  Appropriate orders placed.  Mary Caldwell was informed that the remainder of the evaluation will be completed by another provider, this initial triage assessment does not replace that evaluation, and the importance of remaining in the ED until their evaluation is complete.  Back pain, worsening now with BL LE paresthesias   Workup started   Brannan Cassedy A, PA-C 09/29/21 0837    Teressa Lower, MD 09/29/21 [phone]

## 2021-09-29 NOTE — ED Triage Notes (Signed)
Patient is off the floor, MRI

## 2021-09-29 NOTE — ED Triage Notes (Signed)
Pt arrived via POV, c/o left and right sided hip pain. Left hip pain radiating down leg. Denies any recent falls Has already been seen twice for same.

## 2021-09-29 NOTE — ED Provider Notes (Signed)
[facility] DEPT Provider Note   CSN: [id number] Arrival date & time: 09/29/21  0808     History Chief Complaint  Patient presents with   Hip Pain    Mary Caldwell is a 75 y.o. female.  HPI Patient reports that she started getting back pain about a month ago.  It was lower back pain and some radiation down towards the left buttock.  She reports that about 2 weeks ago however it got much worse.  It started radiating into the left buttock and down the leg with some numbness.  Then subsequently he started getting pain as well into the right buttock and leg.  Now, over the past 2 weeks pain is gotten a severity such that she is using a walker to assist in ambulating.  Deviously she was walking independently.  She reports she has had some numbness and tingling in the left leg.  No incontinence.  No difficulty with urination.  No pain no burning.  Patient has been treated by her PCP first with Celebrex.  Patient reports she did not get any relief.  She was then also treated with Neurontin.  She is continue take the Neurontin but also is having limited pain relief.  At that time hydrocodone 2 tablets every 4 hours was added.  Patient reports she gets some mild pain relief with that to hydrocodone but is still uncomfortable most of the time.  Patient has not tried any steroids.  She reports she does have diabetes however and is on 3 agents.  She reports she checks her blood sugar daily.    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     Family History  Problem Relation Age of Onset   Arthritis Other    Heart disease Other        grandparent   Diabetes Other        parent   Colon cancer Sister     Social History   Tobacco Use   Smoking status: Never   Smokeless tobacco: Never  Vaping Use   Vaping Use: Never used  Substance Use Topics   Alcohol use: Yes    Alcohol/week: 0.0 standard drinks    Comment: only on rare holidays   Drug use: No    Home Medications Prior to Admission medications   Medication Sig Start Date End Date Taking? Authorizing Provider  HYDROcodone-acetaminophen (NORCO/VICODIN) 5-325 MG tablet Take 1-2 tablets by mouth every 6 (six) hours as needed for moderate pain or severe pain. 09/29/21  Yes Charlesetta Shanks, MD  methylPREDNISolone (MEDROL DOSEPAK) 4 MG TBPK tablet Per dose pack instruction 09/29/21  Yes Jacobie Stamey, Jeannie Done, MD  albuterol (VENTOLIN HFA) 108 (90 Base) MCG/ACT inhaler Inhale 2 puffs into the lungs  every 4 (four) hours as needed for wheezing or shortness of breath (or coughing). 9/52/84   Delora Fuel, MD  aspirin EC 81 MG tablet Take 81 mg by mouth daily.    [provider]  celecoxib (CELEBREX) 200 MG capsule Take 1 capsule (200 mg total) by mouth daily. 09/14/21   Burchette, Alinda Sierras, MD  fenofibrate 160 MG tablet Take 1 tablet (160 mg total) by mouth daily. 12/16/20   Burchette, Alinda Sierras, MD  gabapentin (NEURONTIN) 100 MG capsule Take 1 tablet every 8 hours as needed. 09/18/21   Burchette, Alinda Sierras, MD  glimepiride (AMARYL) 4 MG tablet TAKE 1 TABLET BY MOUTH IN THE MORNING 12/17/20   Burchette, Alinda Sierras, MD  HYDROcodone-acetaminophen (NORCO/VICODIN) 5-325 MG tablet Take 1-2 tablets by mouth every 4 (four) hours as needed for moderate  pain. 09/22/21   Burchette, Alinda Sierras, MD  metFORMIN (GLUCOPHAGE) 500 MG tablet TAKE 2 TABLETS BY MOUTH TWICE DAILY WITH FOOD 07/08/21   Burchette, Alinda Sierras, MD  omeprazole (PRILOSEC) 20 MG capsule Take 1 capsule (20 mg total) by mouth daily. 11/29/20   Maudie Flakes, MD  rosuvastatin (CRESTOR) 20 MG tablet Take 1 tablet (20 mg total) by mouth at bedtime. 09/09/21   Burchette, Alinda Sierras, MD  TOUJEO MAX SOLOSTAR 300 UNIT/ML Solostar Pen INJECT 20 UNITS SUBCUTANEOUSLY ONCE DAILY AT BEDTIME 08/26/21   Burchette, Alinda Sierras, MD    Allergies    Atorvastatin and Codeine sulfate  Review of Systems   Review of Systems 10 systems reviewed and negative except as per HPI Physical Exam Updated Vital Signs BP 136/70   Pulse 68   Temp 98.1 F (36.7 C) (Oral)   Resp 16   SpO2 93%   Physical Exam Constitutional:      Comments: Alert nontoxic clear mental status.  No respiratory distress.  HENT:     Head: Normocephalic and atraumatic.     Mouth/Throat:     Pharynx: Oropharynx is clear.  Eyes:     Extraocular Movements: Extraocular movements intact.  Cardiovascular:     Rate and Rhythm: Normal rate and regular rhythm.  Pulmonary:     Effort: Pulmonary effort is normal.     Breath sounds: Normal breath sounds.  Abdominal:     General: There is no distension.     Palpations: Abdomen is soft.     Tenderness: There is no abdominal tenderness. There is no guarding.  Musculoskeletal:     Comments: No reproducible pain in the bony prominences of the back.  Patient has focus of some discomfort deep in the buttock over the obturator in the left.  No lower extremity edema.  Calves are soft nontender.  Feet are in good condition.  No wounds or redness or swelling of the feet or legs.  Dorsalis pedis pulses 2+ and strong.  Feet warm and dry.  Skin:    General: Skin is warm and dry.  Neurological:     Comments: Patient is able to elevate each leg off of the bed independently and hold.  I am able to flex at the  hip knee and ankle and the patient can push against resistance.  It does exacerbate pain in the left low back.  Intact dorsiflexion plantar flexion strength.  Sensation intact to light touch both lower extremities  Psychiatric:        Mood and Affect: Mood normal.    ED Results / Procedures / Treatments   Labs (all labs ordered are listed,  but only abnormal results are displayed) Labs Reviewed  BASIC METABOLIC PANEL - Abnormal; Notable for the following components:      Result Value   Glucose, Bld 199 (*)    All other components within normal limits  CBC WITH DIFFERENTIAL/PLATELET  URINALYSIS, ROUTINE W REFLEX MICROSCOPIC    EKG None  Radiology MR LUMBAR SPINE WO CONTRAST  Result Date: 09/29/2021 CLINICAL DATA:  Continued and worsening low back pain with bilateral leg pain weakness. EXAM: MRI LUMBAR SPINE WITHOUT CONTRAST TECHNIQUE: Multiplanar, multisequence MR imaging of the lumbar spine was performed. No intravenous contrast was administered. COMPARISON:  Radiographs 09/18/2021 and MRI lumbar spine from 07/14/2006 FINDINGS: Segmentation: The lowest lumbar type non-rib-bearing vertebra is labeled as L5. Alignment:  4 mm degenerative anterolisthesis at L4-5. Vertebrae: Disc desiccation at all levels between L1 and L5 with loss of disc height particularly at L4-5. Vacuum disc phenomenon at L4-5 with small Schmorl's nodes. Small hemangioma posteriorly in the L1 vertebral body. Conus medullaris and cauda equina: Conus extends to the T12 level. Conus and cauda equina appear normal. Paraspinal and other soft tissues: Two fluid signal intensity lesions of the right kidney are partially visualized on today's exam, and most likely represent cysts. These were both observed on the recent CT scan from 11/28/2020. Disc levels: T12-L1: Unremarkable. L1-2: No impingement.  Mild disc bulge. L2-3: Borderline right subarticular lateral recess stenosis due to disc bulge. Right foraminal and lateral  extraforaminal annular tear. L3-4: No impingement. Diffuse disc bulge with small left inferior foraminal disc protrusion. These findings are increased compared to the prior exam. L4-5: Prominent central narrowing of the thecal sac with prominent right along with prominent left and moderate to prominent right subarticular lateral recess stenosis due to disc uncovering, disc bulge, right foraminal disc protrusion, left lateral recess disc protrusion, facet arthropathy, and small synovial cysts. This impingement is new compared to the 07/14/2006 exam. L5-S1: Borderline left foraminal stenosis due to small left inferior foraminal disc protrusion and uncinate spurring. IMPRESSION: 1. Progressive lumbar spondylosis and degenerative disc disease along with grade 1 degenerative anterolisthesis at L4-5, resulting in prominent impingement at L4-5 and borderline impingement at L2-3 and L5-S1, as noted above. Electronically Signed   By: Van Clines M.D.   On: 09/29/2021 10:33    Procedures Procedures   Medications Ordered in ED Medications  LORazepam (ATIVAN) injection 0.5 mg (has no administration in time range)    ED Course  I have reviewed the triage vital signs and the nursing notes.  Pertinent labs & imaging results that were available during my care of the patient were reviewed by me and considered in my medical decision making (see chart for details).    MDM Rules/Calculators/A&P                           Patient presents with progressive lower back pain with radicular symptoms.  This has progressed over the past 2 weeks with the patient going from independently ambulatory to now ambulating with a walker.  She is getting limited pain control with combination of gabapentin and maximal dosing of Vicodin.  MRI does show nerve impingement consistent with patient's pain pattern.  Neurosurgery consult.  Consult: Reviewed with Dr. Kathyrn Sheriff.  Recommends starting steroid therapy and follow-up  outpatient in the office for further evaluation.  Anticipate patient will be reassessed within 3 to 6 days.  Patient is alert and appropriate.  Clear mental status.  She is continue  to ambulate with her walker.  Symptoms are predominantly pain without neurologic weakness.  At this time we will proceed with steroid therapy.  I have reviewed with the patient the importance of close monitoring of her blood sugars and contact with her PCP for ongoing management of diabetes while getting additional steroid therapy for radiculopathy.  She voices understanding.  Follow-up plan and return precautions reviewed. Final Clinical Impression(s) / ED Diagnoses Final diagnoses:  Lumbar radiculopathy    Rx / DC Orders ED Discharge Orders          Ordered    methylPREDNISolone (MEDROL DOSEPAK) 4 MG TBPK tablet        09/29/21 1651    HYDROcodone-acetaminophen (NORCO/VICODIN) 5-325 MG tablet  Every 6 hours PRN        09/29/21 1651             Charlesetta Shanks, MD 09/29/21 1654

## 2021-09-30 ENCOUNTER — Other Ambulatory Visit: Payer: Self-pay | Admitting: Family Medicine

## 2021-10-05 ENCOUNTER — Telehealth: Payer: Self-pay | Admitting: Family Medicine

## 2021-10-05 DIAGNOSIS — Z8669 Personal history of other diseases of the nervous system and sense organs: Secondary | ICD-10-CM

## 2021-10-05 NOTE — Telephone Encounter (Signed)
Pt is calling to see if dr burchette would just referral her to neurologist for hip pain. Pt seen dr Elease Hashimoto for hip pain on 09-22-2021 and had mri on 09-29-2021 at [facility] and per pt she needs to see neurologist

## 2021-10-05 NOTE — Telephone Encounter (Signed)
Spoke with the patient. She is aware of dr. Anastasio Auerbach message and has agreed to the referral. Referral has been placed.

## 2021-10-08 DIAGNOSIS — R030 Elevated blood-pressure reading, without diagnosis of hypertension: Secondary | ICD-10-CM | POA: Diagnosis not present

## 2021-10-08 DIAGNOSIS — M4316 Spondylolisthesis, lumbar region: Secondary | ICD-10-CM | POA: Diagnosis not present

## 2021-10-08 DIAGNOSIS — Z6833 Body mass index (BMI) 33.0-33.9, adult: Secondary | ICD-10-CM | POA: Diagnosis not present

## 2021-10-08 DIAGNOSIS — M5126 Other intervertebral disc displacement, lumbar region: Secondary | ICD-10-CM | POA: Diagnosis not present

## 2021-10-09 ENCOUNTER — Other Ambulatory Visit: Payer: Self-pay | Admitting: Family Medicine

## 2021-10-11 ENCOUNTER — Other Ambulatory Visit: Payer: Medicare HMO

## 2021-11-02 DIAGNOSIS — M48062 Spinal stenosis, lumbar region with neurogenic claudication: Secondary | ICD-10-CM | POA: Diagnosis not present

## 2021-11-04 ENCOUNTER — Ambulatory Visit: Payer: Medicare HMO | Attending: Neurosurgery

## 2021-11-04 ENCOUNTER — Other Ambulatory Visit: Payer: Self-pay

## 2021-11-04 DIAGNOSIS — M6281 Muscle weakness (generalized): Secondary | ICD-10-CM | POA: Diagnosis not present

## 2021-11-04 DIAGNOSIS — R293 Abnormal posture: Secondary | ICD-10-CM | POA: Insufficient documentation

## 2021-11-04 DIAGNOSIS — M545 Low back pain, unspecified: Secondary | ICD-10-CM | POA: Insufficient documentation

## 2021-11-04 DIAGNOSIS — G8929 Other chronic pain: Secondary | ICD-10-CM | POA: Diagnosis not present
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
## 2021-11-07 NOTE — Therapy (Signed)
OUTPATIENT PHYSICAL THERAPY TREATMENT NOTE   Patient Name: LACONDA BASICH MRN: [id number] DOB:07-Jan-1946, 75 y.o., female Today's Date: 11/12/2021  PCP: [name], MD REFERRING PROVIDER: [name], MD   PT End of Session - 11/12/21 0915     Visit Number 2    Number of Visits 13    Date for PT Re-Evaluation 12/19/21    Authorization Type [employer] MCR    PT Start Time 0915    PT Stop Time 0957    PT Time Calculation (min) 42 min    Activity Tolerance Patient tolerated treatment well    Behavior During Therapy [facility] for tasks assessed/performed             Past Medical History:  Diagnosis Date   ABDOMINAL ABSCESS 12/29/2009   ASTHMA 02/11/2009   DIABETES-TYPE 2 02/11/2009   ESSENTIAL HYPERTENSION 02/11/2009   HYPERLIPIDEMIA 02/11/2009   OBESITY 02/11/2009   Past Surgical History:  Procedure Laterality Date   [place]   steel rod left leg, now removed   Patient Active Problem List   Diagnosis Date Noted   Hypoxia 04/18/2016   Obesity (BMI 30-39.9) 09/07/2013   ABDOMINAL ABSCESS 12/29/2009   Type 2 diabetes, controlled, with neuropathy ([place]) 02/11/2009   Hyperlipidemia 02/11/2009   OBESITY 02/11/2009   Essential hypertension 02/11/2009   ASTHMA 02/11/2009    REFERRING DIAG: M43.16 (ICD-10-CM) - Spondylolisthesis, lumbar region  THERAPY DIAG:  Muscle weakness (generalized)  Chronic bilateral low back pain, unspecified whether sciatica present  Abnormal posture  PERTINENT HISTORY: Type 2 diabetes, hypertension, arthritis  PRECAUTIONS:   PRECAUTIONS: None   WEIGHT BEARING RESTRICTIONS No  SUBJECTIVE:  Pt reports that she is significantly improved.  She forgot to do her HEP.   PAIN: Are you having pain? Yes NPRS scale: 0/10 Pain location: bilat lumbar paraspinals  Pain orientation: Bilateral and Lower back PAIN TYPE: aching sometimes sharp  Pain description: achy pain in back, LE  intermittent sharp pain Aggravating factors: sitting for long periods of time, driving  Relieving factors: heating pad, laying flat on back   OBJECTIVE:  LUMBARAROM/PROM   A/PROM A/PROM  11/05/2021  Flexion Fingertips to shoes, no change in s/s  Extension Moderate restriction, compensation with hips  Right lateral flexion Moderate restriction, compensation through hips  Left lateral flexion Moderate restriction, compensation through hips  Right rotation Slight restriction  Left rotation Slight restriction   (Blank rows = not tested)     LE MMT:   MMT Right 11/05/2021 Left 11/05/2021  Hip flexion 4- 3+  Hip extension      Hip abduction 4- 3+  Hip adduction      Hip internal rotation 3+ 3+  Hip external rotation 4- 4-  Knee flexion 4 4  Knee extension 4- 4-  Ankle dorsiflexion 4+ 4-  Ankle plantarflexion      Ankle inversion      Ankle eversion       (Blank rows = not tested)   TREATMENT 11/07/2021:  Therapeutic Exercise: - nu-step L5 7m while taking subjective and planning session with patient - LTR - 20x - abdominal contraction - 2x10 - Bridge - 3x10 - partial ROM - alternating SLR - 2x10 - supine clam - 2x10 - RTB - Hip adduction ball squeeze - 2x10   HOME EXERCISE PROGRAM:  Access Code: [email or web address] URL: [email or web address] Date: 11/12/2021 Prepared by: Shearon Balo  Exercises Supine Lower Trunk  Rotation - 2 x daily - 7 x weekly - 2 sets - 10 reps Seated Transversus Abdominis Bracing - 2 x daily - 7 x weekly - 2 sets - 10 reps - 5 sec hold    ASSESSMENT:   Reilly is progressing well with therapy.  Pt reports no increase in baseline pain following therapy.  Today we concentrated on core strengthening and hip strengthening.  Pt with global hip/core/LE weakness.  She fatigues rapidly and must be cued for form, but overall tolerates well.  Pt will continue to benefit from skilled physical therapy to address remaining deficits and achieve  listed goals.  Continue per POC.     GOALS:     SHORT TERM GOALS:   STG Name Target Date Goal status  1 Pt will be indep with initial HEP to improve pain management of condition.  Baseline: issued at eval 11/25/2021 INITIAL  2 Pt will be able to demonstrate correct sitting posture to improve activity tolerance to seated tasks.  Baseline: unable 11/26/2021 INITIAL  3 Pt will be able to demonstrate a pelvic tilt with minimal verbal cues required for abdominal activation to improve lumbopelvic stability.  Baseline: not assessed 11/26/2021 INITIAL    LONG TERM GOALS:    LTG Name Target Date Goal status  1 Pt will be able to achieve 62% predicted function on FOTO to indicate clinically meaningful improvement in function.  Baseline: 48% 12/17/2021 INITIAL  2 Pt will be able to achieve 4+/5 MMT hip strength bilat to indicate improved standing and walking function. Baseline: see objective  12/17/2021 INITIAL  3 Pt will be able to demonstrate bending and lifting mechanics without increased pain to assist in completing household tasks.  Baseline: see objective  12/17/2021 INITIAL  4 Pt will be able to improve 5STS to 15secs in order to signify improvement in BLE endurance/power and improve transfers.  Baseline: see objective  12/17/2021 INITIAL    PLAN: PT FREQUENCY: 1-2x/week   PT DURATION: 6 weeks   PLANNED INTERVENTIONS: Therapeutic exercises, Therapeutic activity, Neuro Muscular re-education, Balance training, Gait training, Patient/Family education, Joint mobilization, Aquatic Therapy, Dry Needling, Electrical stimulation, Cryotherapy, Moist heat, and Manual therapy   PLAN FOR NEXT SESSION: Progress HEP, CPAs/UPAs, 6MWT, balance (TUG), assess response to repeated movements.     Kevan Ny Patterson Hollenbaugh 11/12/2021, 9:57 AM

## 2021-11-12 ENCOUNTER — Ambulatory Visit: Payer: Medicare HMO | Admitting: Physical Therapy

## 2021-11-12 ENCOUNTER — Other Ambulatory Visit: Payer: Self-pay

## 2021-11-12 ENCOUNTER — Encounter: Payer: Self-pay | Admitting: Physical Therapy

## 2021-11-12 DIAGNOSIS — G8929 Other chronic pain: Secondary | ICD-10-CM

## 2021-11-12 DIAGNOSIS — R293 Abnormal posture: Secondary | ICD-10-CM | POA: Diagnosis not present

## 2021-11-12 DIAGNOSIS — M545 Low back pain, unspecified: Secondary | ICD-10-CM | POA: Diagnosis not present

## 2021-11-12 DIAGNOSIS — M6281 Muscle weakness (generalized): Secondary | ICD-10-CM | POA: Diagnosis not present

## 2021-11-19 ENCOUNTER — Ambulatory Visit: Payer: Medicare HMO | Admitting: Physical Therapy

## 2021-11-26 ENCOUNTER — Other Ambulatory Visit: Payer: Self-pay

## 2021-11-26 ENCOUNTER — Ambulatory Visit: Payer: Medicare HMO | Attending: Neurosurgery

## 2021-11-26 DIAGNOSIS — G8929 Other chronic pain: Secondary | ICD-10-CM | POA: Insufficient documentation

## 2021-11-26 DIAGNOSIS — R293 Abnormal posture: Secondary | ICD-10-CM | POA: Insufficient documentation

## 2021-11-26 DIAGNOSIS — M6281 Muscle weakness (generalized): Secondary | ICD-10-CM | POA: Insufficient documentation

## 2021-11-26 DIAGNOSIS — M545 Low back pain, unspecified: Secondary | ICD-10-CM | POA: Diagnosis not present

## 2021-11-26 NOTE — Therapy (Signed)
OUTPATIENT PHYSICAL THERAPY TREATMENT NOTE   Patient Name: Mary Caldwell MRN: [id number] DOB:06/07/1946, 76 y.o., female Today's Date: 11/26/2021  PCP: [name], MD REFERRING PROVIDER: [name], MD   PT End of Session - 11/26/21 1658     Visit Number 3    Number of Visits 13    Date for PT Re-Evaluation 12/19/21    Authorization Type [employer] MCR    Authorization Time Period FOTO v6, v10    Progress Note Due on Visit 10    PT Start Time 1615    PT Stop Time 1700    PT Time Calculation (min) 45 min    Equipment Utilized During Treatment Gait belt    Activity Tolerance Patient tolerated treatment well    Behavior During Therapy [facility] for tasks assessed/performed              Past Medical History:  Diagnosis Date   ABDOMINAL ABSCESS 12/29/2009   ASTHMA 02/11/2009   DIABETES-TYPE 2 02/11/2009   ESSENTIAL HYPERTENSION 02/11/2009   HYPERLIPIDEMIA 02/11/2009   OBESITY 02/11/2009   Past Surgical History:  Procedure Laterality Date   [place]   steel rod left leg, now removed   Patient Active Problem List   Diagnosis Date Noted   Hypoxia 04/18/2016   Obesity (BMI 30-39.9) 09/07/2013   ABDOMINAL ABSCESS 12/29/2009   Type 2 diabetes, controlled, with neuropathy ([place]) 02/11/2009   Hyperlipidemia 02/11/2009   OBESITY 02/11/2009   Essential hypertension 02/11/2009   ASTHMA 02/11/2009    REFERRING DIAG: M43.16 (ICD-10-CM) - Spondylolisthesis, lumbar region  THERAPY DIAG:  Muscle weakness (generalized)  Chronic bilateral low back pain, unspecified whether sciatica present  Abnormal posture  PERTINENT HISTORY: Type 2 diabetes, hypertension, arthritis  PRECAUTIONS:   PRECAUTIONS: None   WEIGHT BEARING RESTRICTIONS No  SUBJECTIVE:  Pt reports feeling well today, although she continues to have R LBP and tightness when sitting for prolonged periods. She adds that she has seen good improvement with PT  to this point. She adds that she has been doing her HEP a couple of days per week.   PAIN: Are you having pain? Yes NPRS scale: 1-2/10 Pain location: Low back Pain orientation: Rt PAIN TYPE: aching sometimes sharp  Pain description: achy pain in back, LE intermittent sharp pain Aggravating factors: sitting for long periods of time, driving  Relieving factors: heating pad, laying flat on back   OBJECTIVE: *Unless otherwise noted, objective data collected previously* LUMBARAROM/PROM   A/PROM A/PROM  11/05/2021  Flexion Fingertips to shoes, no change in s/s  Extension Moderate restriction, compensation with hips  Right lateral flexion Moderate restriction, compensation through hips  Left lateral flexion Moderate restriction, compensation through hips  Right rotation Slight restriction  Left rotation Slight restriction   (Blank rows = not tested)     LE MMT:   MMT Right 11/05/2021 Left 11/05/2021  Hip flexion 4- 3+  Hip extension      Hip abduction 4- 3+  Hip adduction      Hip internal rotation 3+ 3+  Hip external rotation 4- 4-  Knee flexion 4 4  Knee extension 4- 4-  Ankle dorsiflexion 4+ 4-  Ankle plantarflexion      Ankle inversion      Ankle eversion       (Blank rows = not tested)   [facility] Adult PT Treatment:  DATE: 11/26/2021 Therapeutic Exercise: Supine 90/90 abdominal isometric with handhold resistance 4x to exhaustion Supine bridge 3x10 with 3-sec hold Prone knee planks 4x to exhaustion Standing trunk side bend with 10# KB 2x10 BIL  Hookling lower trunk rotations 2x10 in-between bridge sets Prone cobra stretch 3x30 seconds in-between plank sets Standing Pallof press with RTB 2x10 with 5-sec hold BIL Pt education on updated HEP Manual Therapy: N/A Neuromuscular re-ed: N/A Therapeutic Activity: Traditional dead lift with two 10# Kbs to 8" steps 3x10 Functional squat 3x10 Modalities: N/A Self  Care: N/A   TREATMENT 11/07/2021: Therapeutic Exercise: - nu-step L5 22m while taking subjective and planning session with patient - LTR - 20x - abdominal contraction - 2x10 - Bridge - 3x10 - partial ROM - alternating SLR - 2x10 - supine clam - 2x10 - RTB - Hip adduction ball squeeze - 2x10   HOME EXERCISE PROGRAM:  Access Code: [email or web address] URL: [email or web address] Date: 11/12/2021 Prepared by: Shearon Balo  Exercises Supine Lower Trunk Rotation - 2 x daily - 7 x weekly - 2 sets - 10 reps Seated Transversus Abdominis Bracing - 2 x daily - 7 x weekly - 2 sets - 10 reps - 5 sec hold *Updated 11/26/2021:  Abdominal Isometric Hold - FEET OFF TABLE* - 1 x daily - 7 x weekly - 3 sets - 30sec hold Supine Bridge - 1 x daily - 7 x weekly - 3 sets - 10 reps - 3-sec hold Standing Anti-Rotation Press with Anchored Resistance - 1 x daily - 7 x weekly - 2 sets - 10 reps - 5sec hold    ASSESSMENT:   Pt responded well to all interventions today, demonstrating good form with tactile cues and no increase in pain with selected interventions. Of note, she was able to accomplish knee planks and loaded traditional dead lifts today. She reports improved HEP adherence and improved sxs since starting PT. She will continue to benefit from skilled PT to address her primary impairments and return to her prior level of function with less limitation due to pain.     GOALS:     SHORT TERM GOALS:   STG Name Target Date Goal status  1 Pt will be indep with initial HEP to improve pain management of condition.  Baseline: issued at eval 11/25/2021 INITIAL  2 Pt will be able to demonstrate correct sitting posture to improve activity tolerance to seated tasks.  Baseline: unable 11/26/2021 INITIAL  3 Pt will be able to demonstrate a pelvic tilt with minimal verbal cues required for abdominal activation to improve lumbopelvic stability.  Baseline: not assessed 11/26/2021 INITIAL    LONG TERM GOALS:     LTG Name Target Date Goal status  1 Pt will be able to achieve 62% predicted function on FOTO to indicate clinically meaningful improvement in function.  Baseline: 48% 12/17/2021 INITIAL  2 Pt will be able to achieve 4+/5 MMT hip strength bilat to indicate improved standing and walking function. Baseline: see objective  12/17/2021 INITIAL  3 Pt will be able to demonstrate bending and lifting mechanics without increased pain to assist in completing household tasks.  Baseline: see objective  12/17/2021 INITIAL  4 Pt will be able to improve 5STS to 15secs in order to signify improvement in BLE endurance/power and improve transfers.  Baseline: see objective  12/17/2021 INITIAL    PLAN: PT FREQUENCY: 1-2x/week   PT DURATION: 6 weeks   PLANNED INTERVENTIONS: Therapeutic exercises, Therapeutic activity, Neuro Muscular re-education, Balance training, Gait training, Patient/Family  education, Joint mobilization, Aquatic Therapy, Dry Needling, Electrical stimulation, Cryotherapy, Moist heat, and Manual therapy   PLAN FOR NEXT SESSION: Assess CPAs/UPAs, 6MWT, balance (TUG), assess response to repeated movements.     Vanessa [place], PT, DPT 11/26/21 4:59 PM

## 2021-11-26 NOTE — Patient Instructions (Signed)
Pt instructed on proper form with new home exercises.

## 2021-11-30 ENCOUNTER — Other Ambulatory Visit: Payer: Self-pay | Admitting: Family Medicine

## 2021-12-01 NOTE — Therapy (Signed)
OUTPATIENT PHYSICAL THERAPY TREATMENT NOTE   Patient Name: Mary Caldwell MRN: [id number] DOB:September 25, 1946, 76 y.o., female Today's Date: 12/03/2021  PCP: [name], MD REFERRING PROVIDER: [name], MD   PT End of Session - 12/03/21 1532     Visit Number 4    Number of Visits 13    Date for PT Re-Evaluation 12/19/21    Authorization Type [employer] MCR    Authorization Time Period 12/14-2/18/23    Authorization - Visit Number 3    Authorization - Number of Visits 12    Progress Note Due on Visit 10    PT Start Time 1694    PT Stop Time 1611    PT Time Calculation (min) 40 min    Activity Tolerance Patient tolerated treatment well    Behavior During Therapy [facility] for tasks assessed/performed               Past Medical History:  Diagnosis Date   ABDOMINAL ABSCESS 12/29/2009   ASTHMA 02/11/2009   DIABETES-TYPE 2 02/11/2009   ESSENTIAL HYPERTENSION 02/11/2009   HYPERLIPIDEMIA 02/11/2009   OBESITY 02/11/2009   Past Surgical History:  Procedure Laterality Date   [place]   steel rod left leg, now removed   Patient Active Problem List   Diagnosis Date Noted   Hypoxia 04/18/2016   Obesity (BMI 30-39.9) 09/07/2013   ABDOMINAL ABSCESS 12/29/2009   Type 2 diabetes, controlled, with neuropathy ([place]) 02/11/2009   Hyperlipidemia 02/11/2009   OBESITY 02/11/2009   Essential hypertension 02/11/2009   ASTHMA 02/11/2009    REFERRING DIAG: M43.16 (ICD-10-CM) - Spondylolisthesis, lumbar region  THERAPY DIAG:  Muscle weakness (generalized)  Chronic bilateral low back pain, unspecified whether sciatica present  Abnormal posture  PERTINENT HISTORY: Type 2 diabetes, hypertension, arthritis  PRECAUTIONS:   PRECAUTIONS: None   WEIGHT BEARING RESTRICTIONS No  SUBJECTIVE: "My back feels good."     PAIN: Are you having pain? Yes NPRS scale: 3/10 Pain location: Hip Pain orientation: Rt PAIN TYPE:  twinge Pain description: intermittent Aggravating factors: sitting for long periods of time, driving  Relieving factors: heating pad, laying flat on back   OBJECTIVE: *Unless otherwise noted, objective data collected previously* LUMBARAROM/PROM   A/PROM A/PROM  11/05/2021  Flexion Fingertips to shoes, no change in s/s  Extension Moderate restriction, compensation with hips  Right lateral flexion Moderate restriction, compensation through hips  Left lateral flexion Moderate restriction, compensation through hips  Right rotation Slight restriction  Left rotation Slight restriction   (Blank rows = not tested)     LE MMT:   MMT Right 11/05/2021 Left 11/05/2021  Hip flexion 4- 3+  Hip extension      Hip abduction 4- 3+  Hip adduction      Hip internal rotation 3+ 3+  Hip external rotation 4- 4-  Knee flexion 4 4  Knee extension 4- 4-  Ankle dorsiflexion 4+ 4-  Ankle plantarflexion      Ankle inversion      Ankle eversion       (Blank rows = not tested)   POSTURE:  Seated: Significant slumped posture, posterior pelvic tilt, and forward head and shoulders. Slight trunk lean towards Rt in seated. ; 12/03/21: slumped posture, posterior pelvic tilt  Standing: minimal lumbar lordosis, moderate thoracic kyphosis.  [facility] Adult PT Treatment:  DATE: 12/03/21 Therapeutic Exercise: NuStep level 5 x 5 minutes Supine pelvic tilts 2 x 10  SLR 2 x 10 with posterior pelvic tilt 2 x 10  Hip bridge with adduction isometric 2 x 10  Sidelying hip abduction 2 x 10 bilateral (required initial tactile support to maintain lumbopelvic positioning)  Standing resisted shoulder extension red band 2 x 10    [facility] Adult PT Treatment:                                                DATE: 11/26/2021 Therapeutic Exercise: Supine 90/90 abdominal isometric with handhold resistance 4x to exhaustion Supine bridge 3x10 with 3-sec hold Prone knee planks 4x to  exhaustion Standing trunk side bend with 10# KB 2x10 BIL  Hookling lower trunk rotations 2x10 in-between bridge sets Prone cobra stretch 3x30 seconds in-between plank sets Standing Pallof press with RTB 2x10 with 5-sec hold BIL Pt education on updated HEP Manual Therapy: N/A Neuromuscular re-ed: N/A Therapeutic Activity: Traditional dead lift with two 10# Kbs to 8" steps 3x10 Functional squat 3x10 Modalities: N/A Self Care: N/A   TREATMENT 11/07/2021: Therapeutic Exercise: - nu-step L5 71mwhile taking subjective and planning session with patient - LTR - 20x - abdominal contraction - 2x10 - Bridge - 3x10 - partial ROM - alternating SLR - 2x10 - supine clam - 2x10 - RTB - Hip adduction ball squeeze - 2x10   HOME EXERCISE PROGRAM:  Access Code: [email or web address] URL: [email or web address] Date: 11/12/2021 Prepared by: KShearon Balo Exercises Supine Lower Trunk Rotation - 2 x daily - 7 x weekly - 2 sets - 10 reps Seated Transversus Abdominis Bracing - 2 x daily - 7 x weekly - 2 sets - 10 reps - 5 sec hold *Updated 11/26/2021:  Abdominal Isometric Hold - FEET OFF TABLE* - 1 x daily - 7 x weekly - 3 sets - 30sec hold Supine Bridge - 1 x daily - 7 x weekly - 3 sets - 10 reps - 3-sec hold Standing Anti-Rotation Press with Anchored Resistance - 1 x daily - 7 x weekly - 2 sets - 10 reps - 5sec hold    ASSESSMENT:   Continued to progress core and hip strengthening today, which she tolerated well without reports of back pain. She has difficulty initially performing pelvic tilts having tendency to compensate with gluteal activation, though with continued practice she is able to complete minimal reps with proper form without need for cueing. At this time she has met 1/3 STG as she is independent with initial home program, though continues to have poor sitting posture and difficulty with pelvic tilt activation.      GOALS:     SHORT TERM GOALS:   STG Name Target Date  Goal status  1 Pt will be indep with initial HEP to improve pain management of condition.  Baseline: issued at eval 11/25/2021 MET   2 Pt will be able to demonstrate correct sitting posture to improve activity tolerance to seated tasks.  Baseline: unable 11/26/2021 ONGOING  3 Pt will be able to demonstrate a pelvic tilt with minimal verbal cues required for abdominal activation to improve lumbopelvic stability.  Baseline: not assessed 11/26/2021 ONGOING    LONG TERM GOALS:    LTG Name Target Date Goal status  1 Pt will be able to achieve 62% predicted function on FOTO to  indicate clinically meaningful improvement in function.  Baseline: 48% 12/17/2021 INITIAL  2 Pt will be able to achieve 4+/5 MMT hip strength bilat to indicate improved standing and walking function. Baseline: see objective  12/17/2021 INITIAL  3 Pt will be able to demonstrate bending and lifting mechanics without increased pain to assist in completing household tasks.  Baseline: see objective  12/17/2021 INITIAL  4 Pt will be able to improve 5STS to 15secs in order to signify improvement in BLE endurance/power and improve transfers.  Baseline: see objective  12/17/2021 INITIAL    PLAN: PT FREQUENCY: 1-2x/week   PT DURATION: 6 weeks   PLANNED INTERVENTIONS: Therapeutic exercises, Therapeutic activity, Neuro Muscular re-education, Balance training, Gait training, Patient/Family education, Joint mobilization, Aquatic Therapy, Dry Needling, Electrical stimulation, Cryotherapy, Moist heat, and Manual therapy   PLAN FOR NEXT SESSION: core and hip strengthening  [name], PT, DPT, ATC 12/03/21 4:13 PM

## 2021-12-03 ENCOUNTER — Ambulatory Visit: Payer: Medicare HMO

## 2021-12-03 ENCOUNTER — Other Ambulatory Visit: Payer: Self-pay

## 2021-12-03 DIAGNOSIS — R293 Abnormal posture: Secondary | ICD-10-CM

## 2021-12-03 DIAGNOSIS — M545 Low back pain, unspecified: Secondary | ICD-10-CM

## 2021-12-03 DIAGNOSIS — M6281 Muscle weakness (generalized): Secondary | ICD-10-CM

## 2021-12-03 DIAGNOSIS — G8929 Other chronic pain: Secondary | ICD-10-CM | POA: Diagnosis not present

## 2021-12-10 ENCOUNTER — Ambulatory Visit: Payer: Medicare HMO

## 2021-12-10 ENCOUNTER — Other Ambulatory Visit: Payer: Self-pay

## 2021-12-10 DIAGNOSIS — R293 Abnormal posture: Secondary | ICD-10-CM | POA: Diagnosis not present

## 2021-12-10 DIAGNOSIS — M6281 Muscle weakness (generalized): Secondary | ICD-10-CM

## 2021-12-10 DIAGNOSIS — G8929 Other chronic pain: Secondary | ICD-10-CM

## 2021-12-10 DIAGNOSIS — M545 Low back pain, unspecified: Secondary | ICD-10-CM | POA: Diagnosis not present

## 2021-12-10 NOTE — Therapy (Signed)
OUTPATIENT PHYSICAL THERAPY TREATMENT NOTE   Patient Name: Mary Caldwell MRN: [id number] DOB:May 24, 1946, 76 y.o., female Today's Date: 12/10/2021  PCP: [name], MD REFERRING PROVIDER: Consuella Lose, MD   PT End of Session - 12/10/21 1531     Visit Number 5    Number of Visits 13    Date for PT Re-Evaluation 12/19/21    Authorization Type [employer] MCR    Authorization Time Period 12/14-2/18/23    Authorization - Visit Number 4    Authorization - Number of Visits 12    Progress Note Due on Visit 10    PT Start Time 4037    PT Stop Time 1611    PT Time Calculation (min) 40 min    Activity Tolerance Patient tolerated treatment well    Behavior During Therapy [facility] for tasks assessed/performed                Past Medical History:  Diagnosis Date   ABDOMINAL ABSCESS 12/29/2009   ASTHMA 02/11/2009   DIABETES-TYPE 2 02/11/2009   ESSENTIAL HYPERTENSION 02/11/2009   HYPERLIPIDEMIA 02/11/2009   OBESITY 02/11/2009   Past Surgical History:  Procedure Laterality Date   [place]   steel rod left leg, now removed   Patient Active Problem List   Diagnosis Date Noted   Hypoxia 04/18/2016   Obesity (BMI 30-39.9) 09/07/2013   ABDOMINAL ABSCESS 12/29/2009   Type 2 diabetes, controlled, with neuropathy ([place]) 02/11/2009   Hyperlipidemia 02/11/2009   OBESITY 02/11/2009   Essential hypertension 02/11/2009   ASTHMA 02/11/2009    REFERRING DIAG: M43.16 (ICD-10-CM) - Spondylolisthesis, lumbar region  THERAPY DIAG:  Muscle weakness (generalized)  Chronic bilateral low back pain, unspecified whether sciatica present  Abnormal posture  PERTINENT HISTORY: Type 2 diabetes, hypertension, arthritis  PRECAUTIONS:   PRECAUTIONS: None   WEIGHT BEARING RESTRICTIONS No  SUBJECTIVE:  "I'm feeling pretty good today. Some tightness, but no pain in my back."   PAIN: Are you having pain? No NPRS scale: 0/10 Pain  location: N/A Pain orientation: N/A PAIN TYPE: N/A Pain description: N/A Aggravating factors: N/A Relieving factors: N/A  OBJECTIVE: *Unless otherwise noted, objective data collected previously* LUMBARAROM/PROM   A/PROM A/PROM  11/05/2021  Flexion Fingertips to shoes, no change in s/s  Extension Moderate restriction, compensation with hips  Right lateral flexion Moderate restriction, compensation through hips  Left lateral flexion Moderate restriction, compensation through hips  Right rotation Slight restriction  Left rotation Slight restriction   (Blank rows = not tested)     LE MMT:   MMT Right 11/05/2021 Left 11/05/2021 12/10/21  Hip flexion 4- 3+ Bilateral 4-/5  Hip extension       Hip abduction 4- 3+   Hip adduction       Hip internal rotation 3+ 3+   Hip external rotation 4- 4-   Knee flexion 4 4   Knee extension 4- 4-   Ankle dorsiflexion 4+ 4-   Ankle plantarflexion       Ankle inversion       Ankle eversion        (Blank rows = not tested)   POSTURE:  Seated: Significant slumped posture, posterior pelvic tilt, and forward head and shoulders. Slight trunk lean towards Rt in seated. ; 12/03/21: slumped posture, posterior pelvic tilt  Standing: minimal lumbar lordosis, moderate thoracic kyphosis.    [facility] Adult PT Treatment:  DATE: 12/10/21 Therapeutic Exercise: NuStep level 5 x 5 minutes  Supine pelvic tilts 2 x 10; heavy cues  Hip bridge with abduction red band 2 x 10  Sidelying hip abduction 2 x 10  Seated TA march 2 x 10  Sit to stand 2 x 10  Updated HEP   [facility] Adult PT Treatment:                                                DATE: 12/03/21 Therapeutic Exercise: NuStep level 5 x 5 minutes Supine pelvic tilts 2 x 10  SLR 2 x 10 with posterior pelvic tilt 2 x 10  Hip bridge with adduction isometric 2 x 10  Sidelying hip abduction 2 x 10 bilateral (required initial tactile support to maintain lumbopelvic  positioning)  Standing resisted shoulder extension red band 2 x 10    [facility] Adult PT Treatment:                                                DATE: 11/26/2021 Therapeutic Exercise: Supine 90/90 abdominal isometric with handhold resistance 4x to exhaustion Supine bridge 3x10 with 3-sec hold Prone knee planks 4x to exhaustion Standing trunk side bend with 10# KB 2x10 BIL  Hookling lower trunk rotations 2x10 in-between bridge sets Prone cobra stretch 3x30 seconds in-between plank sets Standing Pallof press with RTB 2x10 with 5-sec hold BIL Pt education on updated HEP Manual Therapy: N/A Neuromuscular re-ed: N/A Therapeutic Activity: Traditional dead lift with two 10# Kbs to 8" steps 3x10 Functional squat 3x10 Modalities: N/A Self Care: N/A   TREATMENT 11/07/2021: Therapeutic Exercise: - nu-step L5 36mwhile taking subjective and planning session with patient - LTR - 20x - abdominal contraction - 2x10 - Bridge - 3x10 - partial ROM - alternating SLR - 2x10 - supine clam - 2x10 - RTB - Hip adduction ball squeeze - 2x10   HOME EXERCISE PROGRAM:  Access Code: [email or web address]     ASSESSMENT:   Patient tolerated session well today focusing on core and hip strengthening. She remains challenged with performing a pelvic tilt requiring heavy verbal and tactile cues to perform. She continues to lack overall lumbopelvic stability with mat strengthening requiring consistent cues to maintain proper alignment with gluteal strengthening. Her hip flexor strength has improved on the LLE compared to initial evaluation. She has initial hesitancy to perform sit to stand without UE support, though with encouragement she is able to perform with good control without UE support. She tolerated session well today without reports of pain, reporting muscle fatigue at end of session.      GOALS:     SHORT TERM GOALS:   STG Name Target Date Goal status  1 Pt will be indep with initial HEP to improve pain  management of condition.  Baseline: issued at eval 11/25/2021 MET   2 Pt will be able to demonstrate correct sitting posture to improve activity tolerance to seated tasks.  Baseline: unable 11/26/2021 ONGOING  3 Pt will be able to demonstrate a pelvic tilt with minimal verbal cues required for abdominal activation to improve lumbopelvic stability.  Baseline: not assessed 11/26/2021 ONGOING    LONG TERM GOALS:    LTG Name Target Date Goal  status  1 Pt will be able to achieve 62% predicted function on FOTO to indicate clinically meaningful improvement in function.  Baseline: 48% 12/17/2021 INITIAL  2 Pt will be able to achieve 4+/5 MMT hip strength bilat to indicate improved standing and walking function. Baseline: see objective  12/17/2021 ONGOING  3 Pt will be able to demonstrate bending and lifting mechanics without increased pain to assist in completing household tasks.  Baseline: see objective  12/17/2021 INITIAL  4 Pt will be able to improve 5STS to 15secs in order to signify improvement in BLE endurance/power and improve transfers.  Baseline: see objective  12/17/2021 INITIAL    PLAN: PT FREQUENCY: 1-2x/week   PT DURATION: 6 weeks   PLANNED INTERVENTIONS: Therapeutic exercises, Therapeutic activity, Neuro Muscular re-education, Balance training, Gait training, Patient/Family education, Joint mobilization, Aquatic Therapy, Dry Needling, Electrical stimulation, Cryotherapy, Moist heat, and Manual therapy   PLAN FOR NEXT SESSION: core and hip strengthening  [name], PT, DPT, ATC 12/10/21 4:12 PM

## 2021-12-16 NOTE — Therapy (Signed)
OUTPATIENT PHYSICAL THERAPY TREATMENT NOTE PHYSICAL THERAPY DISCHARGE SUMMARY  Visits from Start of Care: 6  Current functional level related to goals / functional outcomes: See goals    Remaining deficits: See objective   Education / Equipment: See education   Patient agrees to discharge. Patient goals were partially met. Patient is being discharged due to being pleased with the current functional level.   Patient Name: Mary Caldwell MRN: [id number] DOB:Jul 25, 1946, 76 y.o., female Today's Date: 12/17/2021  PCP: [name], MD REFERRING PROVIDER: Consuella Lose, MD   PT End of Session - 12/17/21 1531     Visit Number 6    Number of Visits 13    Date for PT Re-Evaluation 12/19/21    Authorization Type [employer] MCR    Authorization Time Period 12/14-2/18/23    Authorization - Visit Number 5    Authorization - Number of Visits 12    Progress Note Due on Visit 10    PT Start Time 5462    PT Stop Time 1557    PT Time Calculation (min) 26 min    Activity Tolerance Patient tolerated treatment well    Behavior During Therapy [facility] for tasks assessed/performed                 Past Medical History:  Diagnosis Date   ABDOMINAL ABSCESS 12/29/2009   ASTHMA 02/11/2009   DIABETES-TYPE 2 02/11/2009   ESSENTIAL HYPERTENSION 02/11/2009   HYPERLIPIDEMIA 02/11/2009   OBESITY 02/11/2009   Past Surgical History:  Procedure Laterality Date   [place]   steel rod left leg, now removed   Patient Active Problem List   Diagnosis Date Noted   Hypoxia 04/18/2016   Obesity (BMI 30-39.9) 09/07/2013   ABDOMINAL ABSCESS 12/29/2009   Type 2 diabetes, controlled, with neuropathy ([place]) 02/11/2009   Hyperlipidemia 02/11/2009   OBESITY 02/11/2009   Essential hypertension 02/11/2009   ASTHMA 02/11/2009    REFERRING DIAG: M43.16 (ICD-10-CM) - Spondylolisthesis, lumbar region  THERAPY DIAG:  Muscle weakness  (generalized)  Chronic bilateral low back pain, unspecified whether sciatica present  Abnormal posture  PERTINENT HISTORY: Type 2 diabetes, hypertension, arthritis  PRECAUTIONS:   PRECAUTIONS: None   WEIGHT BEARING RESTRICTIONS No  SUBJECTIVE:  Patient reports her back has been feeling good. She reports occasional twinge of pain in her Rt hip, but this does not happen every day. Patient feels that she is in a good place as far as her progress goes and can continue her exercises on her own.    PAIN: Are you having pain? No NPRS scale: 0/10 Pain location: N/A Pain orientation: N/A PAIN TYPE: N/A Pain description: N/A Aggravating factors: N/A Relieving factors: N/A  OBJECTIVE: *Unless otherwise noted, objective data collected previously* LUMBARAROM/PROM   A/PROM A/PROM  11/05/2021 12/17/21  Flexion Fingertips to shoes, no change in s/s WNL  Extension Moderate restriction, compensation with hips WNL  Right lateral flexion Moderate restriction, compensation through hips WNL  Left lateral flexion Moderate restriction, compensation through hips WNL  Right rotation Slight restriction WNL  Left rotation Slight restriction WNL   (Blank rows = not tested)     LE MMT:   MMT Right 11/05/2021 Left 11/05/2021 12/10/21 12/17/21  Hip flexion 4- 3+ Bilateral 4-/5 Bilateral 4/5   Hip extension        Hip abduction 4- 3+  Bilateral 4-/5   Hip adduction  Hip internal rotation 3+ 3+  Bilateral 4+/5  Hip external rotation 4- 4-  Bilateral 4+/5   Knee flexion 4 4  Bilateral 5/5  Knee extension 4- 4-  Bilateral 5/5  Ankle dorsiflexion 4+ 4-  Bilateral 5/5  Ankle plantarflexion        Ankle inversion        Ankle eversion         (Blank rows = not tested)   POSTURE:  Seated: Significant slumped posture, posterior pelvic tilt, and forward head and shoulders. Slight trunk lean towards Rt in seated. ; 12/03/21: slumped posture, posterior pelvic tilt  Standing: minimal lumbar  lordosis, moderate thoracic kyphosis.  12/17/21: Slumped posture, though able to self-correct   FUNCTIONAL TEST 12/17/21: Sit to stand 15 seconds   FUNCTIONAL SURVEY 12/17/21: Mary Caldwell 94%   [facility] Adult PT Treatment:                                                DATE: 12/17/21 Therapeutic Exercise: Reviewed HEP discussing frequency, sets, and reps of each exercise.   Therapeutic Activity: Education on re-assessment findings, progress towards goals, and D/C education    [facility] Adult PT Treatment:                                                DATE: 12/10/21 Therapeutic Exercise: NuStep level 5 x 5 minutes  Supine pelvic tilts 2 x 10; heavy cues  Hip bridge with abduction red band 2 x 10  Sidelying hip abduction 2 x 10  Seated TA march 2 x 10  Sit to stand 2 x 10  Updated HEP   [facility] Adult PT Treatment:                                                DATE: 12/03/21 Therapeutic Exercise: NuStep level 5 x 5 minutes Supine pelvic tilts 2 x 10  SLR 2 x 10 with posterior pelvic tilt 2 x 10  Hip bridge with adduction isometric 2 x 10  Sidelying hip abduction 2 x 10 bilateral (required initial tactile support to maintain lumbopelvic positioning)  Standing resisted shoulder extension red band 2 x 10    [facility] Adult PT Treatment:                                                DATE: 11/26/2021 Therapeutic Exercise: Supine 90/90 abdominal isometric with handhold resistance 4x to exhaustion Supine bridge 3x10 with 3-sec hold Prone knee planks 4x to exhaustion Standing trunk side bend with 10# KB 2x10 BIL  Hookling lower trunk rotations 2x10 in-between bridge sets Prone cobra stretch 3x30 seconds in-between plank sets Standing Pallof press with RTB 2x10 with 5-sec hold BIL Pt education on updated HEP Manual Therapy: N/A Neuromuscular re-ed: N/A Therapeutic Activity: Traditional dead lift with two 10# Kbs to 8" steps 3x10 Functional squat 3x10 Modalities: N/A Self Care: N/A   TREATMENT  11/07/2021: Therapeutic Exercise: -  nu-step L5 57m while taking subjective and planning session with patient - LTR - 20x - abdominal contraction - 2x10 - Bridge - 3x10 - partial ROM - alternating SLR - 2x10 - supine clam - 2x10 - RTB - Hip adduction ball squeeze - 2x10   HOME EXERCISE PROGRAM:  Access Code: [email or web address]     ASSESSMENT:  Mary Caldwell has progressed well throughout duration of care reporting significant improvements in her back pain. She has met the majority of her functional goals demonstrating improvements in strength, posture, and body mechanics since the start of care. She is pleased with her overall progress in therapy and feels she can continue to progress her strength independently with her HEP. She is therefore appropriate for discharge at this time.       GOALS:     SHORT TERM GOALS:   STG Name Target Date Goal status  1 Pt will be indep with initial HEP to improve pain management of condition.  Baseline: issued at eval 11/25/2021 MET   2 Pt will be able to demonstrate correct sitting posture to improve activity tolerance to seated tasks.  Baseline: unable 11/26/2021 MET  3 Pt will be able to demonstrate a pelvic tilt with minimal verbal cues required for abdominal activation to improve lumbopelvic stability.  Baseline: not assessed 11/26/2021 ONGOING    LONG TERM GOALS:    LTG Name Target Date Goal status  1 Pt will be able to achieve 62% predicted function on FOTO to indicate clinically meaningful improvement in function.  Baseline: 48% 12/17/2021 MET  2 Pt will be able to achieve 4+/5 MMT hip strength bilat to indicate improved standing and walking function. Baseline: see objective  12/17/2021 ONGOING  3 Pt will be able to demonstrate bending and lifting mechanics without increased pain to assist in completing household tasks.  Baseline: see objective  12/17/2021 MET  4 Pt will be able to improve 5STS to 15secs in order to signify improvement in BLE endurance/power  and improve transfers.  Baseline: see objective  12/17/2021 MET    PLAN: PT FREQUENCY: 1-2x/week   PT DURATION: 6 weeks   PLANNED INTERVENTIONS: Therapeutic exercises, Therapeutic activity, Neuro Muscular re-education, Balance training, Gait training, Patient/Family education, Joint mobilization, Aquatic Therapy, Dry Needling, Electrical stimulation, Cryotherapy, Moist heat, and Manual therapy   PLAN FOR NEXT SESSION: N/A  [name], PT, DPT, ATC 12/17/21 5:20 PM

## 2021-12-17 ENCOUNTER — Ambulatory Visit: Payer: Medicare HMO

## 2021-12-17 ENCOUNTER — Other Ambulatory Visit: Payer: Self-pay

## 2021-12-17 DIAGNOSIS — M545 Low back pain, unspecified: Secondary | ICD-10-CM

## 2021-12-17 DIAGNOSIS — M6281 Muscle weakness (generalized): Secondary | ICD-10-CM

## 2021-12-17 DIAGNOSIS — R293 Abnormal posture: Secondary | ICD-10-CM | POA: Diagnosis not present

## 2021-12-17 DIAGNOSIS — G8929 Other chronic pain: Secondary | ICD-10-CM | POA: Diagnosis not present

## 2021-12-21 ENCOUNTER — Other Ambulatory Visit: Payer: Self-pay | Admitting: Family Medicine

## 2021-12-25 ENCOUNTER — Telehealth: Payer: Medicare HMO

## 2022-01-11 ENCOUNTER — Other Ambulatory Visit: Payer: Self-pay | Admitting: Family Medicine

## 2022-01-12 ENCOUNTER — Telehealth: Payer: Self-pay | Admitting: Pharmacist

## 2022-01-12 NOTE — Chronic Care Management (AMB) (Signed)
Chronic Care Management Pharmacy Assistant   Name: Mary Caldwell  MRN: [id number] DOB: [birth year]  Reason for Encounter: Disease State Hypertension and Diabetes    Conditions to be addressed/monitored: HTN and DMII  Recent office visits:  None  Recent consult visits:  12/17/21  Edwin Cap, PT - Patient presented for Muscle weakness and other concerns. No medication changes.  11/02/21 Reece Agar (Anesthesiology) - Patient presented for Spinal Stenosis lumbar region and other concerns. No other visit details available.  10/08/21 Consuella Lose (Neurosurgery) - Patient presented for Spondylolisthesis lumbar region and other concerns. No other visit details available.   Hospital visits:  Medication Reconciliation was completed by comparing discharge summary, patients EMR and Pharmacy list, and upon discussion with patient.  Patient [facility] ED [facility] due to Lumbar radiculopathy.Patient was present for 9 hours.  New?Medications Started at [facility] Discharge:?? -started  [place] 4 MG  (NORCO/VICODIN) 5-325 MG   Medication Changes at Hospital Discharge: -Changed  none  Medications Discontinued at Hospital Discharge: -Stopped  none  Medications that remain the same after Hospital Discharge:??  -All other medications will remain the same.    Medications: Outpatient Encounter Medications as of 01/12/2022  Medication Sig   albuterol (VENTOLIN HFA) 108 (90 Base) MCG/ACT inhaler Inhale 2 puffs into the lungs every 4 (four) hours as needed for wheezing or shortness of breath (or coughing).   aspirin EC 81 MG tablet Take 81 mg by mouth daily.   celecoxib (CELEBREX) 200 MG capsule Take 1 capsule (200 mg total) by mouth daily.   fenofibrate 160 MG tablet Take 1 tablet by mouth once daily   gabapentin (NEURONTIN) 100 MG capsule Take 1 tablet every 8 hours as needed.   glimepiride (AMARYL) 4 MG tablet TAKE 1 TABLET BY  MOUTH IN THE MORNING   HYDROcodone-acetaminophen (NORCO/VICODIN) 5-325 MG tablet Take 1-2 tablets by mouth every 4 (four) hours as needed for moderate pain.   HYDROcodone-acetaminophen (NORCO/VICODIN) 5-325 MG tablet Take 1-2 tablets by mouth every 6 (six) hours as needed for moderate pain or severe pain.   metFORMIN (GLUCOPHAGE) 500 MG tablet TAKE 2 TABLETS BY MOUTH TWICE DAILY WITH FOOD   methylPREDNISolone (MEDROL DOSEPAK) 4 MG TBPK tablet Per dose pack instruction   omeprazole (PRILOSEC) 20 MG capsule Take 1 capsule (20 mg total) by mouth daily.   rosuvastatin (CRESTOR) 20 MG tablet TAKE 1 TABLET BY MOUTH AT BEDTIME   TOUJEO MAX SOLOSTAR 300 UNIT/ML Solostar Pen INJECT 20 UNITS SUBCUTANEOUSLY ONCE DAILY AT BEDTIME   No facility-administered encounter medications on file as of 01/12/2022.  Recent Relevant Labs: Lab Results  Component Value Date/Time   HGBA1C 7.6 (A) 09/11/2021 02:44 PM   HGBA1C 7.3 (A) 05/12/2021 08:48 AM   HGBA1C 7.9 (H) 08/15/2019 02:37 PM   HGBA1C 7.7 (H) 01/24/2017 10:03 AM   MICROALBUR <0.7 09/13/2018 05:07 PM   MICROALBUR 1.6 01/24/2017 10:03 AM    Kidney Function Lab Results  Component Value Date/Time   CREATININE 0.68 09/29/2021 09:18 AM   CREATININE 0.84 11/28/2020 05:04 PM   GFR 84.77 08/15/2019 02:37 PM   GFRNONAA >60 09/29/2021 09:18 AM   GFRAA >60 06/08/2019 01:40 AM    Current antihyperglycemic regimen:  Metformin 500 mg - 2 tabs twice daily with a meal Toujeo - 20 units SUBQ at bedtime Glimepiride - 1 tab in the morning  Notes: Unable to reach after several attempts  Care Gaps: BP- 142/70 ( 09/22/21)  AWV- office aware to sched as of 11/22 CCM-Unable to reach after several attempts Hepatitis C Screening - Overdue Zoster Vaccine - Overdue DEXA - Overdue Eye Exam - Overdue COVID Booster - Overdue Colonoscopy - Overdue Flu Vaccine - Overdue Foot Exam - Overdue Lab Results  Component Value Date   HGBA1C 7.6 (A) 09/11/2021    Star  Rating Drugs: Rosuvastatin (Crestor) 20 mg - Last filled 12/21/21 90 DS at [facility] Metformin (Glucophage) 500 mg - Last filled 10/09/2021 90 DS at [place] Verified as Accurate Glimepiride (Amaryl) 4 mg - Last filled 12/21/21 90 DS at [place] Pharmacist Assistant [phone]

## 2022-01-25 ENCOUNTER — Other Ambulatory Visit: Payer: Self-pay | Admitting: Family Medicine

## 2022-02-01 ENCOUNTER — Encounter: Payer: Self-pay | Admitting: Family Medicine

## 2022-02-01 ENCOUNTER — Ambulatory Visit (INDEPENDENT_AMBULATORY_CARE_PROVIDER_SITE_OTHER): Payer: Medicare HMO | Admitting: Family Medicine

## 2022-02-01 VITALS — BP 130/70 | HR 87 | Temp 98.0°F | Ht 60.0 in | Wt 173.5 lb

## 2022-02-01 DIAGNOSIS — H6123 Impacted cerumen, bilateral: Secondary | ICD-10-CM

## 2022-02-01 NOTE — Patient Instructions (Signed)
Set up 2 week office follow up (30 minutes appt).   ?
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
## 2022-02-15 ENCOUNTER — Encounter: Payer: Self-pay | Admitting: Family Medicine

## 2022-02-15 ENCOUNTER — Telehealth: Payer: Self-pay | Admitting: Family Medicine

## 2022-02-15 ENCOUNTER — Ambulatory Visit (INDEPENDENT_AMBULATORY_CARE_PROVIDER_SITE_OTHER): Payer: Medicare HMO | Admitting: Family Medicine

## 2022-02-15 VITALS — BP 140/68 | HR 76 | Temp 97.9°F | Ht 60.0 in | Wt 170.4 lb

## 2022-02-15 DIAGNOSIS — E785 Hyperlipidemia, unspecified: Secondary | ICD-10-CM

## 2022-02-15 DIAGNOSIS — E1140 Type 2 diabetes mellitus with diabetic neuropathy, unspecified: Secondary | ICD-10-CM

## 2022-02-15 DIAGNOSIS — I10 Essential (primary) hypertension: Secondary | ICD-10-CM

## 2022-02-15 DIAGNOSIS — M47816 Spondylosis without myelopathy or radiculopathy, lumbar region: Secondary | ICD-10-CM | POA: Diagnosis not present

## 2022-02-15 LAB — COMPREHENSIVE METABOLIC PANEL
ALT: 15 U/L (ref 0–35)
AST: 25 U/L (ref 0–37)
Albumin: 4.4 g/dL (ref 3.5–5.2)
Alkaline Phosphatase: 24 U/L — ABNORMAL LOW (ref 39–117)
BUN: 14 mg/dL (ref 6–23)
CO2: 29 mEq/L (ref 19–32)
Calcium: 9.5 mg/dL (ref 8.4–10.5)
Chloride: 94 mEq/L — ABNORMAL LOW (ref 96–112)
Creatinine, Ser: 0.85 mg/dL (ref 0.40–1.20)
GFR: 66.88 mL/min (ref >60.00)
Glucose, Bld: 132 mg/dL — ABNORMAL HIGH (ref 70–99)
Potassium: 3.9 mEq/L (ref 3.5–5.1)
Sodium: 130 mEq/L — ABNORMAL LOW (ref 135–145)
Total Bilirubin: 0.6 mg/dL (ref 0.2–1.2)
Total Protein: 7 g/dL (ref 6.0–8.3)

## 2022-02-15 LAB — LIPID PANEL
Cholesterol: 158 mg/dL (ref 0–200)
HDL: 39.5 mg/dL (ref >39.00)
NonHDL: 118.13
Total CHOL/HDL Ratio: 4
Triglycerides: 397 mg/dL — ABNORMAL HIGH (ref 0.0–149.0)
VLDL: 79.4 mg/dL — ABNORMAL HIGH (ref 0.0–40.0)

## 2022-02-15 LAB — LDL CHOLESTEROL, DIRECT: Direct LDL: 70 mg/dL

## 2022-02-15 LAB — HEMOGLOBIN A1C: Hgb A1c MFr Bld: 7.4 % — ABNORMAL HIGH (ref 4.6–6.5)

## 2022-02-15 MED ORDER — HYDROCODONE-ACETAMINOPHEN 5-325 MG PO TABS: 1.0000 | ORAL_TABLET | ORAL | 0 refills | Status: DC | PRN

## 2022-02-15 NOTE — Telephone Encounter (Signed)
[employer] called in requesting a diagnosis code for the request that was sent in for the HYDROcodone-acetaminophen (NORCO/VICODIN) 5-325 MG tablet [[id number]]  medication. ? ?Please advise. ?
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
## 2022-02-15 NOTE — Patient Instructions (Signed)
We will call you with labs.   If A1C remains up we need to consider additional diabetes medication ? ?Please set up 3 month referral ? ?Monitor blood pressure and be in touch if consistently > 140/90.   ?

## 2022-02-15 NOTE — Telephone Encounter (Signed)
[place] with [facility] pharmacy informed of dx code ?

## 2022-02-16 MED ORDER — EMPAGLIFLOZIN 10 MG PO TABS: 10.0000 mg | ORAL_TABLET | Freq: Every day | ORAL | 0 refills | Status: DC

## 2022-02-16 NOTE — Addendum Note (Signed)
Addended by: Nilda Riggs on: 02/16/2022 10:25 AM ? ? Modules accepted: Orders ? ?

## 2022-02-17 ENCOUNTER — Telehealth: Payer: Self-pay | Admitting: Family Medicine

## 2022-02-17 NOTE — Telephone Encounter (Signed)
Pt informed of the message and verbalized understanding  

## 2022-02-17 NOTE — Telephone Encounter (Signed)
Pt seen dr Elease Hashimoto on 02-15-2022 and would like to know if she suppose to still take glimepiride along with the other dm medications ?

## 2022-03-01 ENCOUNTER — Telehealth: Payer: Self-pay | Admitting: Family Medicine

## 2022-03-01 ENCOUNTER — Ambulatory Visit (INDEPENDENT_AMBULATORY_CARE_PROVIDER_SITE_OTHER): Payer: Medicare HMO

## 2022-03-01 VITALS — Ht 60.0 in | Wt 170.0 lb

## 2022-03-01 DIAGNOSIS — Z0000 Encounter for general adult medical examination without abnormal findings: Secondary | ICD-10-CM | POA: Diagnosis not present
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
## 2022-03-01 NOTE — Patient Instructions (Addendum)
?Ms. Caldwell , ?Thank you for taking time to come for your Medicare Wellness Visit. I appreciate your ongoing commitment to your health goals. Please review the following plan we discussed and let me know if I can assist you in the future.  ? ?These are the goals we discussed: ? Goals   ? ?   Chronic Care Management   ?   CARE PLAN ENTRY ?(see longitudinal plan of care for additional care plan information) ? ?Current Barriers:  ?Chronic Disease Management support, education, and care coordination needs related to Hypertension, Hyperlipidemia, Diabetes and Asthma  ?  ?Hypertension ?BP Readings from Last 3 Encounters:  ?06/24/20 134/76  ?03/24/20 110/70  ?02/06/20 122/66  ?Pharmacist Clinical Goal(s): ?Over the next 90 days, patient will work with PharmD and providers to maintain BP goal <130/80 ?Interventions: ?Discussed low salt diet and exercising as tolerated extensively ?Patient self care activities - Over the next 90 days, patient will: ?Ensure daily salt intake < 2300 mg/day ? ?Hyperlipidemia ?Lab Results  ?Component Value Date/Time  ? LDLDIRECT 51.0 08/15/2019 02:37 PM  ?Pharmacist Clinical Goal(s): ?Over the next 90 days, patient will work with PharmD and providers to maintain LDL goal < 70 ?Current regimen:  ?Fenofibrate 160 mg daily  ?Rosuvastatin 20 mg daily  ?Interventions: ?Discussed low cholesterol diet and exercising as tolerated extensively ? ?Diabetes ?Lab Results  ?Component Value Date/Time  ? HGBA1C 6.9 (A) 06/24/2020 08:42 AM  ? HGBA1C 6.6 (A) 02/06/2020 07:40 AM  ? HGBA1C 7.9 (H) 08/15/2019 02:37 PM  ? HGBA1C 7.7 (H) 01/24/2017 10:03 AM  ?Pharmacist Clinical Goal(s): ?Over the next 90 days, patient will work with PharmD and providers to maintain A1c goal <7% ?Current regimen:  ?Glimepiride 4 mg daily ?Toujeo Max Solostar 30 units daily  ?Metformin 500 mg 2 tablets twice daily  ?Interventions: ?Discussed carbohydrate counting and exercising as tolerated extensively ?Patient self care activities -  Over the next 90 days, patient will: ?Check blood sugar once daily (Fasting or Bedtime), document, and provide at future appointments ?Contact provider with any episodes of hypoglycemia ? ?Medication management ?Pharmacist Clinical Goal(s): ?Over the next 90 days, patient will work with PharmD and providers to maintain optimal medication adherence ?Current pharmacy: [facility] ?Interventions ?Comprehensive medication review performed. ?Continue current medication management strategy ?Patient self care activities - Over the next 90 days, patient will: ?Take medications as prescribed ?Report any questions or concerns to PharmD and/or provider(s) ?  ?   Increase physical activity (pt-stated)   ?   Try to walk more. ?  ? ?  ?  ?This is a list of the screening recommended for you and due dates:  ?Health Maintenance  ?Topic Date Due  ? Complete foot exam   06/24/2021  ? Eye exam for diabetics  03/01/2022*  ? COVID-19 Vaccine (3 - Pfizer risk series) 03/17/2022*  ? Zoster (Shingles) Vaccine (1 of 2) 05/31/2022*  ? DEXA scan (bone density measurement)  03/02/2023*  ? Colon Cancer Screening  03/02/2023*  ? Hepatitis C Screening: USPSTF Recommendation to screen - Ages 18-79 yo.  03/02/2023*  ? Flu Shot  06/22/2022  ? Hemoglobin A1C  08/18/2022  ? Tetanus Vaccine  03/21/2031  ? Pneumonia Vaccine  Completed  ? HPV Vaccine  Aged Out  ?*Topic was postponed. The date shown is not the original due date.  ? ?Opioid Pain Medicine Management ?Opioids are powerful medicines that are used to treat moderate to severe pain. When used for short periods of time, they can  help you to: ?Sleep better. ?Do better in physical or occupational therapy. ?Feel better in the first few days after an injury. ?Recover from surgery. ?Opioids should be taken with the supervision of a trained health care provider. They should be taken for the shortest period of time possible. This is because opioids can be addictive, and the longer you take opioids, the  greater your risk of addiction. This addiction can also be called opioid use disorder. ?What are the risks? ?Using opioid pain medicines for longer than 3 days increases your risk of side effects. Side effects include: ?Constipation. ?Nausea and vomiting. ?Breathing difficulties (respiratory depression). ?Drowsiness. ?Confusion. ?Opioid use disorder. ?Itching. ?Taking opioid pain medicine for a long period of time can affect your ability to do daily tasks. It also puts you at risk for: ?Motor vehicle crashes. ?Depression. ?Suicide. ?Heart attack. ?Overdose, which can be life-threatening. ?What is a pain treatment plan? ?A pain treatment plan is an agreement between you and your health care provider. Pain is unique to each person, and treatments vary depending on your condition. To manage your pain, you and your health care provider need to work together. To help you do this: ?Discuss the goals of your treatment, including how much pain you might expect to have and how you will manage the pain. ?Review the risks and benefits of taking opioid medicines. ?Remember that a good treatment plan uses more than one approach and minimizes the chance of side effects. ?Be honest about the amount of medicines you take and about any drug or alcohol use. ?Get pain medicine prescriptions from only one health care provider. ?Pain can be managed with many types of alternative treatments. Ask your health care provider to refer you to one or more specialists who can help you manage pain through: ?Physical or occupational therapy. ?Counseling (cognitive behavioral therapy). ?Good nutrition. ?Biofeedback. ?Massage. ?Meditation. ?Non-opioid medicine. ?Following a gentle exercise program. ?How to use opioid pain medicine ?Taking medicine ?Take your pain medicine exactly as told by your health care provider. Take it only when you need it. ?If your pain gets less severe, you may take less than your prescribed dose if your health care  provider approves. ?If you are not having pain, do nottake pain medicine unless your health care provider tells you to take it. ?If your pain is severe, do nottry to treat it yourself by taking more pills than instructed on your prescription. Contact your health care provider for help. ?Write down the times when you take your pain medicine. It is easy to become confused while on pain medicine. Writing the time can help you avoid overdose. ?Take other over-the-counter or prescription medicines only as told by your health care provider. ?Keeping yourself and others safe ? ?While you are taking opioid pain medicine: ?Do not drive, use machinery, or power tools. ?Do not sign legal documents. ?Do not drink alcohol. ?Do not take sleeping pills. ?Do not supervise children by yourself. ?Do not do activities that require climbing or being in high places. ?Do not go to a lake, river, ocean, spa, or swimming pool. ?Do not share your pain medicine with anyone. ?Keep pain medicine in a locked cabinet or in a secure area where pets and children cannot reach it. ?Stopping your use of opioids ?If you have been taking opioid medicine for more than a few weeks, you may need to slowly decrease (taper) how much you take until you stop completely. Tapering your use of opioids can decrease your risk of  symptoms of withdrawal, such as: ?Pain and cramping in the abdomen. ?Nausea. ?Sweating. ?Sleepiness. ?Restlessness. ?Uncontrollable shaking (tremors). ?Cravings for the medicine. ?Do not attempt to taper your use of opioids on your own. Talk with your health care provider about how to do this. Your health care provider may prescribe a step-down schedule based on how much medicine you are taking and how long you have been taking it. ?Getting rid of leftover pills ?Do not save any leftover pills. Get rid of leftover pills safely by: ?Taking the medicine to a prescription take-back program. This is usually offered by the county or law  enforcement. ?Bringing them to a pharmacy that has a drug disposal container. ?Flushing them down the toilet. Check the label or package insert of your medicine to see whether this is safe to do. ?Throwing them out

## 2022-03-01 NOTE — Telephone Encounter (Signed)
Dr changed medication--pt wants to know if she is supposed to still take her Toujeo injections?  Please advise patient. ?

## 2022-03-02 NOTE — Telephone Encounter (Signed)
It seems like the plan was to add Jardiance to her diabetes regimen and continue Toujeo same dose. ?Thanks, ?BJ ?

## 2022-03-02 NOTE — Telephone Encounter (Signed)
Pt informed of the message and verbalized understanding  

## 2022-03-08 ENCOUNTER — Other Ambulatory Visit: Payer: Self-pay | Admitting: Family Medicine

## 2022-03-08 MED ORDER — HYDROCODONE-ACETAMINOPHEN 5-325 MG PO TABS: 1.0000 | ORAL_TABLET | ORAL | 0 refills | Status: DC | PRN

## 2022-03-08 NOTE — Telephone Encounter (Signed)
Pt requesting refill of her HYDROcodone-acetaminophen (NORCO/VICODIN) 5-325 MG tablet  ?

## 2022-03-08 NOTE — Telephone Encounter (Signed)
Refilled once but avoid chronic regular use. ?

## 2022-03-10 DIAGNOSIS — Z6832 Body mass index (BMI) 32.0-32.9, adult: Secondary | ICD-10-CM | POA: Diagnosis not present

## 2022-03-10 DIAGNOSIS — M4316 Spondylolisthesis, lumbar region: Secondary | ICD-10-CM | POA: Diagnosis not present

## 2022-03-10 DIAGNOSIS — M5126 Other intervertebral disc displacement, lumbar region: Secondary | ICD-10-CM | POA: Diagnosis not present

## 2022-03-15 ENCOUNTER — Other Ambulatory Visit: Payer: Self-pay | Admitting: Family Medicine

## 2022-03-22 ENCOUNTER — Encounter: Payer: Self-pay | Admitting: Family Medicine

## 2022-03-22 ENCOUNTER — Ambulatory Visit (INDEPENDENT_AMBULATORY_CARE_PROVIDER_SITE_OTHER): Payer: Medicare HMO | Admitting: Family Medicine

## 2022-03-22 VITALS — BP 142/78 | HR 79 | Temp 98.0°F | Ht 60.0 in | Wt 171.4 lb

## 2022-03-22 DIAGNOSIS — R300 Dysuria: Secondary | ICD-10-CM

## 2022-03-22 DIAGNOSIS — E1140 Type 2 diabetes mellitus with diabetic neuropathy, unspecified: Secondary | ICD-10-CM

## 2022-03-22 DIAGNOSIS — R030 Elevated blood-pressure reading, without diagnosis of hypertension: Secondary | ICD-10-CM | POA: Diagnosis not present

## 2022-03-22 DIAGNOSIS — I10 Essential (primary) hypertension: Secondary | ICD-10-CM
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
## 2022-03-22 NOTE — Patient Instructions (Signed)
Stop the Glimepiride and start the Jardiance. ? ?I do recommend we consider starting something for the BP and let me know if you change your mind ? ?Set up follow up in 2 months to reassess.    ?

## 2022-03-23 ENCOUNTER — Telehealth: Payer: Self-pay | Admitting: Family Medicine

## 2022-03-23 NOTE — Telephone Encounter (Signed)
Patient called to get refill on HYDROcodone-acetaminophen (NORCO/VICODIN) 5-325 MG tablet ? ? ? ?Patient states that she only has two to three pills left  ? ? ?Please send to ? ? ?[place], [place] Phone:  [phone]  ?Fax:  [phone]  ?  ? ? ? ? ? ?Please advise  ?

## 2022-03-24 MED ORDER — HYDROCODONE-ACETAMINOPHEN 5-325 MG PO TABS: 1.0000 | ORAL_TABLET | Freq: Four times a day (QID) | ORAL | 0 refills | Status: DC | PRN

## 2022-03-24 NOTE — Telephone Encounter (Signed)
Pain medication has been sent. ?

## 2022-03-24 NOTE — Telephone Encounter (Signed)
Noted  

## 2022-04-05 DIAGNOSIS — M48062 Spinal stenosis, lumbar region with neurogenic claudication: Secondary | ICD-10-CM | POA: Diagnosis not present

## 2022-04-06 ENCOUNTER — Other Ambulatory Visit: Payer: Self-pay | Admitting: Family Medicine

## 2022-04-15 ENCOUNTER — Other Ambulatory Visit: Payer: Self-pay | Admitting: Family Medicine

## 2022-04-15 NOTE — Telephone Encounter (Signed)
Medication currently not on medication list.  Last OV- 03/22/2022 Next OV- 05/18/22  Can patient receive refill on medication?

## 2022-04-16 ENCOUNTER — Telehealth: Payer: Self-pay | Admitting: Family Medicine

## 2022-04-16 MED ORDER — TOUJEO MAX SOLOSTAR 300 UNIT/ML ~~LOC~~ SOPN: PEN_INJECTOR | SUBCUTANEOUS | 0 refills | Status: AC

## 2022-04-16 NOTE — Telephone Encounter (Signed)
Rx done. 

## 2022-04-16 NOTE — Telephone Encounter (Signed)
Pt is calling and needs a  refill on TOUJEO MAX SOLOSTAR 300 UNIT/ML Solostar Pen. Pt is aware md back in office on 04-20-2022. Pt only has 2 injection left. Pt would like a callback when med has been sent [place], [place] - [place] Phone:  [phone]  Fax:  [phone]

## 2022-04-23 ENCOUNTER — Telehealth: Payer: Self-pay | Admitting: Pharmacist

## 2022-04-23 NOTE — Telephone Encounter (Signed)
Called patient to follow up on medication assistance per request from PCP. Had to leave a voicemail and requested a call back.

## 2022-04-24 ENCOUNTER — Other Ambulatory Visit: Payer: Self-pay | Admitting: Family Medicine

## 2022-04-26 ENCOUNTER — Other Ambulatory Visit: Payer: Self-pay | Admitting: Family Medicine

## 2022-04-29 DIAGNOSIS — H25813 Combined forms of age-related cataract, bilateral: Secondary | ICD-10-CM | POA: Diagnosis not present

## 2022-04-29 DIAGNOSIS — E119 Type 2 diabetes mellitus without complications: Secondary | ICD-10-CM | POA: Diagnosis not present

## 2022-04-29 DIAGNOSIS — H04123 Dry eye syndrome of bilateral lacrimal glands: Secondary | ICD-10-CM | POA: Diagnosis not present

## 2022-05-05 NOTE — Telephone Encounter (Signed)
Tried to call patient again but had to leave a voicemail. Requested a call back.

## 2022-05-06 NOTE — Telephone Encounter (Signed)
Patient called back. She cannot currently afford both Jardiance and Toujeo. Scheduled an in person visit next week to fill out patient assistance paperwork both both as she will likely qualify based on her income.

## 2022-05-07 NOTE — Chronic Care Management (AMB) (Signed)
Patient Assistance forms for Toujeu and Jardiance pre filled and uploaded for appointment in office on the [facility] Pharmacist Assistant [phone]

## 2022-05-11 NOTE — Progress Notes (Unsigned)
Chronic Care Management Pharmacy Note  05/12/2022 Name:  Mary Caldwell MRN:  [id number] DOB:  1946/04/27  Summary: A1c not at goal < 7% BP not at goal < 130/80  Recommendations/Changes made from today's visit: -Recommended bringing BP cuff to next office visit to ensure accuracy -Recommended restarting Ozempic 0.25 mg and decreasing Toujeo dose to 30 units -Recommended considering CGM for blood sugar monitoring  Plan: Filled out patient assistance paperwork for Ozempic, Toujeo and Jardiance Follow up after PCP visit   Subjective: Mary Caldwell is an 76 y.o. year old female who is a primary patient of Burchette, Elberta Fortis, MD.  The CCM team was consulted for assistance with disease management and care coordination needs.    Engaged with patient face to face for follow up visit in response to provider referral for pharmacy case management and/or care coordination services.   Consent to Services:  The patient was given information about Chronic Care Management services, agreed to services, and gave verbal consent prior to initiation of services.  Please see initial visit note for detailed documentation.   Patient Care Team: Kristian Covey, MD as PCP - General Verner Chol, [facility] as Pharmacist (Pharmacist)  Recent office visits: 03/22/22 Evelena Peat, MD: Patient presented for UTI and ear fullness. Plan to start the Jardiance and d/c glimepiride.  Patient declined starting BP medication.  03/01/22 Theresa Mulligan, LPN: Patient presented for AWV.  02/15/22 Evelena Peat, MD: Patient presented for DM and HTN follow up. Consider the addition of Jardiance.  Recent consult visits: 03/10/22 Lisbeth Renshaw (neurosurgery): Patient presented for intervertebral disc displacement follow up. Unable to access notes.  12/17/21  [place], Roxy Cedar, PT - Patient presented for Muscle weakness and other concerns. No medication changes.   11/02/21 Renaldo Fiddler (Anesthesiology) - Patient  presented for Spinal Stenosis lumbar region and other concerns. No other visit details available.  Hospital visits: None in previous 6 months   Objective:  Lab Results  Component Value Date   CREATININE 0.85 02/15/2022   BUN 14 02/15/2022   GFR 66.88 02/15/2022   GFRNONAA >60 09/29/2021   GFRAA >60 06/08/2019   NA 130 (L) 02/15/2022   K 3.9 02/15/2022   CALCIUM 9.5 02/15/2022   CO2 29 02/15/2022   GLUCOSE 132 (H) 02/15/2022    Lab Results  Component Value Date/Time   HGBA1C 7.4 (H) 02/15/2022 08:59 AM   HGBA1C 7.6 (A) 09/11/2021 02:44 PM   HGBA1C 7.3 (A) 05/12/2021 08:48 AM   HGBA1C 7.9 (H) 08/15/2019 02:37 PM   GFR 66.88 02/15/2022 08:59 AM   GFR 84.77 08/15/2019 02:37 PM   MICROALBUR <0.7 09/13/2018 05:07 PM   MICROALBUR 1.6 01/24/2017 10:03 AM    Last diabetic Eye exam:  Lab Results  Component Value Date/Time   HMDIABEYEEXA No Retinopathy 03/16/2017 12:00 AM    Last diabetic Foot exam:  Lab Results  Component Value Date/Time   HMDIABFOOTEX normal 09/07/2013 12:00 AM     Lab Results  Component Value Date   CHOL 158 02/15/2022   HDL 39.50 02/15/2022   LDLDIRECT 70.0 02/15/2022   TRIG 397.0 (H) 02/15/2022   CHOLHDL 4 02/15/2022       Latest Ref Rng & Units 02/15/2022    8:59 AM 12/01/2020    9:21 AM 08/15/2019    2:37 PM  Hepatic Function  Total Protein 6.0 - 8.3 g/dL 7.0  6.6  6.2   Albumin 3.5 - 5.2 g/dL 4.4  4.3  4.1  AST 0 - 37 U/L $Remo'25  14  16   'ddTgR$ ALT 0 - 35 U/L $Remo'15  10  12   'QJtcY$ Alk Phosphatase 39 - 117 U/L 24  33  32   Total Bilirubin 0.2 - 1.2 mg/dL 0.6  0.4  0.4   Bilirubin, Direct 0.0 - 0.3 mg/dL  0.1  0.1     Lab Results  Component Value Date/Time   TSH 1.71 12/01/2020 09:21 AM   FREET4 0.86 12/01/2020 09:21 AM       Latest Ref Rng & Units 09/29/2021    9:18 AM 11/28/2020    5:04 PM 06/08/2019    1:40 AM  CBC  WBC 4.0 - 10.5 K/uL 9.0  8.4  7.5   Hemoglobin 12.0 - 15.0 g/dL 12.8  13.9  12.3   Hematocrit 36.0 - 46.0 % 37.2  39.8  36.7    Platelets 150 - 400 K/uL 236  288  256     No results found for: "VD25OH"  Clinical ASCVD: No  The 10-year ASCVD risk score (Arnett DK, et al., 2019) is: 33.2%   Values used to calculate the score:     Age: 76 years     Sex: Female     Is Non-Hispanic African American: No     Diabetic: Yes     Tobacco smoker: No     Systolic Blood Pressure: 683 mmHg     Is BP treated: No     HDL Cholesterol: 39.5 mg/dL     Total Cholesterol: 158 mg/dL       03/01/2022    1:31 PM 02/01/2022    3:24 PM 11/12/2020    3:51 PM  Depression screen PHQ 2/9  Decreased Interest 0 0 0  Down, Depressed, Hopeless 0 0 0  PHQ - 2 Score 0 0 0      Social History   Tobacco Use  Smoking Status Never  Smokeless Tobacco Never   BP Readings from Last 3 Encounters:  05/12/22 (!) 142/82  03/22/22 (!) 142/78  02/15/22 140/68   Pulse Readings from Last 3 Encounters:  03/22/22 79  02/15/22 76  02/01/22 87   Wt Readings from Last 3 Encounters:  03/22/22 171 lb 6.4 oz (77.7 kg)  03/01/22 170 lb (77.1 kg)  02/15/22 170 lb 6.4 oz (77.3 kg)   BMI Readings from Last 3 Encounters:  03/22/22 33.47 kg/m  03/01/22 33.20 kg/m  02/15/22 33.28 kg/m    Assessment/Interventions: Review of patient past medical history, allergies, medications, health status, including review of consultants reports, laboratory and other test data, was performed as part of comprehensive evaluation and provision of chronic care management services.   SDOH:  (Social Determinants of Health) assessments and interventions performed: Yes SDOH Interventions    Flowsheet Row Most Recent Value  SDOH Interventions   Financial Strain Interventions Other (Comment)  [working on patient assistance]      SDOH Screenings   Alcohol Screen: Low Risk  (03/01/2022)   Alcohol Screen    Last Alcohol Screening Score (AUDIT): 0  Depression (PHQ2-9): Low Risk  (03/01/2022)   Depression (PHQ2-9)    PHQ-2 Score: 0  Financial Resource Strain:  Medium Risk (05/12/2022)   Overall Financial Resource Strain (CARDIA)    Difficulty of Paying Living Expenses: Somewhat hard  Food Insecurity: No Food Insecurity (03/01/2022)   Hunger Vital Sign    Worried About Running Out of Food in the Last Year: Never true    Ran Out of Food  in the Last Year: Never true  Housing: Low Risk  (03/01/2022)   Housing    Last Housing Risk Score: 0  Physical Activity: Insufficiently Active (03/01/2022)   Exercise Vital Sign    Days of Exercise per Week: 2 days    Minutes of Exercise per Session: 60 min  Social Connections: Moderately Integrated (03/01/2022)   Social Connection and Isolation Panel [NHANES]    Frequency of Communication with Friends and Family: More than three times a week    Frequency of Social Gatherings with Friends and Family: More than three times a week    Attends Religious Services: More than 4 times per year    Active Member of [employer] or Organizations: Yes    Attends Archivist Meetings: More than 4 times per year    Marital Status: Widowed  Stress: No Stress Concern Present (03/01/2022)   [place]    Feeling of Stress : Not at all  Tobacco Use: Low Risk  (03/22/2022)   Patient History    Smoking Tobacco Use: Never    Smokeless Tobacco Use: Never    Passive Exposure: Not on file  Transportation Needs: No Transportation Needs (03/01/2022)   PRAPARE - Transportation    Lack of Transportation (Medical): No    Lack of Transportation (Non-Medical): No    CCM Care Plan  Allergies  Allergen Reactions   Atorvastatin     Does not remember   Codeine Sulfate Nausea Only    GI upset    Medications Reviewed Today     Reviewed by [name], MD (Physician) on 03/22/22 at 1425  Med List Status: <None>   Medication Order Taking? Sig Documenting Provider Last Dose Status Informant  albuterol (VENTOLIN HFA) 108 (90 Base) MCG/ACT inhaler [id number] Yes  Inhale 2 puffs into the lungs every 4 (four) hours as needed for wheezing or shortness of breath (or coughing). Delora Fuel, MD Taking Active   aspirin EC 81 MG tablet [id number] Yes Take 81 mg by mouth daily. [provider] Taking Active   Discontinued 03/22/22 1425   empagliflozin (JARDIANCE) 10 MG TABS tablet [id number] Yes Take 1 tablet (10 mg total) by mouth daily. [name], MD Taking Active   fenofibrate 160 MG tablet [id number] Yes Take 1 tablet by mouth once daily Burchette, Alinda Sierras, MD Taking Active   gabapentin (NEURONTIN) 100 MG capsule [id number] Yes Take 1 tablet every 8 hours as needed. [name], MD Taking Active   glimepiride (AMARYL) 4 MG tablet [id number] Yes TAKE 1 TABLET BY MOUTH IN THE MORNING Burchette, Alinda Sierras, MD Taking Active   HYDROcodone-acetaminophen (NORCO/VICODIN) 5-325 MG tablet [id number] Yes Take 1 tablet by mouth every 4 (four) hours as needed for moderate pain. [name], MD Taking Active   metFORMIN (GLUCOPHAGE) 500 MG tablet [id number] Yes TAKE 2 TABLETS BY MOUTH TWICE DAILY WITH FOOD Burchette, Alinda Sierras, MD Taking Active   omeprazole (PRILOSEC) 20 MG capsule [id number] Yes Take 1 capsule (20 mg total) by mouth daily. Maudie Flakes, MD Taking Active   rosuvastatin (CRESTOR) 20 MG tablet [id number] Yes TAKE 1 TABLET BY MOUTH AT BEDTIME [name], MD Taking Active   TOUJEO MAX SOLOSTAR 300 UNIT/ML Solostar Pen [id number] Yes INJECT 20 UNITS SUBCUTANEOUSLY ONCE DAILY AT BEDTIME [name], MD Taking Active             Patient Active Problem List   Diagnosis Date  Noted   Hypoxia 04/18/2016   Obesity (BMI 30-39.9) 09/07/2013   ABDOMINAL ABSCESS 12/29/2009   Type 2 diabetes, controlled, with neuropathy ([place]) 02/11/2009   Hyperlipidemia 02/11/2009   OBESITY 02/11/2009   Essential hypertension 02/11/2009   ASTHMA 02/11/2009    Immunization History  Administered Date(s) Administered   Influenza Split  10/13/2011   Influenza, High Dose Seasonal PF 12/03/2015, 09/27/2016, 09/13/2018   Influenza,inj,Quad PF,6+ Mos 09/07/2013, 10/01/2014   PFIZER(Purple Top)SARS-COV-2 Vaccination 07/29/2020, 08/25/2020   Pneumococcal Conjugate-13 12/03/2015   Pneumococcal Polysaccharide-23 09/07/2013   Td 11/22/2005   Tdap 03/20/2021    Conditions to be addressed/monitored:  Hypertension, Hyperlipidemia, Diabetes, GERD, and Pain  Conditions addressed this visit: Hypertension, diabetes  Care Plan : [place]  Updates made by Viona Gilmore, [place] since 05/12/2022 12:00 AM     Problem: Problem: Hypertension, Hyperlipidemia, Diabetes, GERD, and Pain      Long-Range Goal: Patient-Specific Goal   Start Date: 05/12/2022  Expected End Date: 05/13/2023  This Visit's Progress: On track  Priority: High  Note:   Current Barriers:  Unable to independently afford treatment regimen Unable to independently monitor therapeutic efficacy Unable to achieve control of triglycerides  Unable to maintain control of diabetes  Pharmacist Clinical Goal(s):  Patient will verbalize ability to afford treatment regimen achieve adherence to monitoring guidelines and medication adherence to achieve therapeutic efficacy achieve control of triglycerides as evidenced by next lipid panel maintain control of diabetes as evidenced by A1c  through collaboration with PharmD and provider.   Interventions: 1:1 collaboration with [name], MD regarding development and update of comprehensive plan of care as evidenced by provider attestation and co-signature Inter-disciplinary care team collaboration (see longitudinal plan of care) Comprehensive medication review performed; medication list updated in electronic medical record  Hypertension (BP goal <130/80) -Uncontrolled -Current treatment: No medications -Medications previously tried: none  -Current home readings: not checking at home often; owns an arm  cuff and unsure if it's accurate -Current dietary habits: tries not to have a lot of sodium, cut out potato chips; does eat out with neighbor once a week -Current exercise habits: walks a couple days a week with neighbor -Denies hypotensive/hypertensive symptoms -Educated on BP goals and benefits of medications for prevention of heart attack, stroke and kidney damage; Daily salt intake goal < 2300 mg; Importance of home blood pressure monitoring; -Counseled to monitor BP at home 1-2 times a week, document, and provide log at future appointments -Counseled on diet and exercise extensively Recommended bringing BP cuff to next office visit to ensure accuracy.  Hyperlipidemia: (LDL goal < 70) -Not ideally controlled; TGs not at goal < 150 -Current treatment: Rosuvastatin 20 mg 1 tablet at bedtime - Appropriate, Query effective, Safe, Accessible Fenofibrate 160 mg 1 tablet daily - Appropriate, Query effective, Safe, Accessible -Medications previously tried: none  -Current dietary patterns: eating at home mostly; tries to limits sweets -Current exercise habits: walking some -Educated on Cholesterol goals;  Benefits of statin for ASCVD risk reduction; Importance of limiting foods high in cholesterol; Exercise goal of 150 minutes per week; -Counseled on diet and exercise extensively Recommended to continue current medication  Diabetes (A1c goal <7%) -Uncontrolled -Current medications: Jardiance 10 mg 1 tablet daily - Appropriate, Query effective, Safe, Query accessible Toujeo 300 units/mL injecting 40 units daily - Appropriate, Query effective, Safe, Query accessible Metformin 500 mg 2 tablets twice daily - Appropriate, Query effective, Safe, Accessible -Medications previously tried: Ozempic (cost), glimepiride (not needed)  -  Current home glucose readings fasting glucose: 120s, 110-120s post prandial glucose: 140 (before dinner)  -Denies hypoglycemic/hyperglycemic symptoms -Current meal  patterns:  3 meals a day breakfast: scrambled eggs and toast and sometimes bacon, coffee  lunch: salad, half of sandwich  dinner: small piece of meat; more vegetarian; roll, vegetables snacks: backed down on snacking before bed drinks: unsweet tea, water some -Current exercise: back pain limits her, walks in neighborhood some with neighbor a couple days a week; signed up for the [employer] - thinking about swimming classes -Educated on A1c and blood sugar goals; Complications of diabetes including kidney damage, retinal damage, and cardiovascular disease; Benefits of routine self-monitoring of blood sugar; Continuous glucose monitoring; Carbohydrate counting and/or plate method -Counseled to check feet daily and get yearly eye exams -Counseled on diet and exercise extensively Recommended restarting Ozempic pending approval of patient assistance.  Pain (Goal: minimize pain) -Not ideally controlled -Current treatment  Gabapentin 100 mg 1 capsule every 8 hours as needed - Appropriate, Query effective, Safe, Accessible Hydrocodone-APAP 5-325 mg 1 tablet every 6 hours as needed - Appropriate, Query effective, Query Safe, Accessible -Medications previously tried: n/a  -Recommended to continue current medication  GERD (Goal: minimize symptoms) -Controlled -Current treatment  Omeprazole 20 mg 1 capsule daily - Appropriate, Effective, Query Safe, Accessible -Medications previously tried: none  - Reassess the need at follow up.  Health Maintenance -Vaccine gaps: shingrix, COVID booster -Current therapy:  Aspirin 81 mg 1 tablet daily -Educated on Herbal supplement research is limited and benefits usually cannot be proven -Patient is satisfied with current therapy and denies issues - Reassess the need for aspirin at follow up.  Patient Goals/Self-Care Activities Patient will:  - check glucose daily, document, and provide at future appointments check blood pressure 1-2 times a week, document,  and provide at future appointments collaborate with provider on medication access solutions target a minimum of 150 minutes of moderate intensity exercise weekly  Follow Up Plan: The care management team will reach out to the patient again over the next 7 days.        Medication Assistance: Application for Toujeo, Ozempic and Jardiance  medication assistance program. in process.  Anticipated assistance start date 06/11/22.  See plan of care for additional detail.  Compliance/Adherence/Medication fill history: Care Gaps: Eye exam, COVID booster, foot exam, shingrix, DEXA, colonoscopy, Hep C screening Last BP: 142/78 (03/22/22) Last A1c: 7.4% (02/15/22)   Star-Rating Drugs: Rosuvastatin (Crestor) 20 mg - Last filled 04/06/22 90 DS at [facility] Metformin (Glucophage) 500 mg - Last filled 01/25/22 90 DS at [facility] Verified as Accurate Jardiance 10 mg - Last filled 02/16/22 for 30 DS at [facility]  Patient's preferred pharmacy is:  [place], [place] - [address] [place] [address] Phone: [phone] Fax: [phone]  [id number] - [place], [place] - [address] [place] [address] Phone: [phone] Fax: [phone]  Uses pill box? Yes Pt endorses 99% compliance  We discussed: Current pharmacy is preferred with insurance plan and patient is satisfied with pharmacy services Patient decided to: Continue current medication management strategy  Care Plan and Follow Up Patient Decision:  Patient agrees to Care Plan and Follow-up.  Plan: The care management team will reach out to the patient again over the next 7 days.  Jeni Salles, PharmD, [place] Pharmacist [place] [facility] [place]

## 2022-05-12 ENCOUNTER — Ambulatory Visit (INDEPENDENT_AMBULATORY_CARE_PROVIDER_SITE_OTHER): Payer: Medicare HMO | Admitting: Pharmacist

## 2022-05-12 VITALS — BP 142/82

## 2022-05-12 DIAGNOSIS — E1140 Type 2 diabetes mellitus with diabetic neuropathy, unspecified: Secondary | ICD-10-CM

## 2022-05-12 DIAGNOSIS — I10 Essential (primary) hypertension: Secondary | ICD-10-CM

## 2022-05-17 ENCOUNTER — Telehealth: Payer: Self-pay | Admitting: Family Medicine

## 2022-05-18 ENCOUNTER — Ambulatory Visit: Payer: Medicare HMO | Admitting: Family Medicine

## 2022-05-20 NOTE — Telephone Encounter (Signed)
9 pens of Toujeo samples are available for the patient to pick up from patient assistance.  Patient is aware.

## 2022-05-21 DIAGNOSIS — Z7984 Long term (current) use of oral hypoglycemic drugs: Secondary | ICD-10-CM | POA: Diagnosis not present

## 2022-05-21 DIAGNOSIS — E1159 Type 2 diabetes mellitus with other circulatory complications: Secondary | ICD-10-CM | POA: Diagnosis not present

## 2022-05-21 DIAGNOSIS — I10 Essential (primary) hypertension: Secondary | ICD-10-CM | POA: Diagnosis not present

## 2022-05-28 ENCOUNTER — Telehealth: Payer: Self-pay

## 2022-05-28 NOTE — Telephone Encounter (Signed)
Pt called back returning CMA's call  Denice Paradise)  Pt asked that she call her back when she can  [phone]

## 2022-05-28 NOTE — Telephone Encounter (Signed)
---  Caller states blood sugar is 70 and she has a headache. Pt is on metformin bid , glipizide daily , and toujeo 40 units daily  05/28/2022 7:01:28 AM Call PCP within [place], RN, [place]  Comments User: Macy Mis, RN Date/Time Eilene Ghazi Time): 05/28/2022 6:59:48 AM Caller states blood sugar is now 89 and headache is now gone. Referrals REFERRED TO PCP OFFICE  05/28/22 0915 - Pt states this is her 1st time having a hypglycemic episode. Pt states her medications were recently changed & she's taking Toujeo, Jardiance, Glipizide, & Metformin. Pt advised that she may continue to have episodes like this if she does not get her medications adjusted & advised to schedule appt with PCP. Pt declines saying she's going out of town. Earliest appt she could make is next Thurs or Fri. Appt scheduled with PCP on 06/04/22.

## 2022-05-28 NOTE — Telephone Encounter (Signed)
Patient informed of the message below.

## 2022-05-28 NOTE — Telephone Encounter (Signed)
Left a message for the patient to return my call.  

## 2022-06-04 ENCOUNTER — Ambulatory Visit (INDEPENDENT_AMBULATORY_CARE_PROVIDER_SITE_OTHER): Payer: Medicare HMO | Admitting: Family Medicine

## 2022-06-04 ENCOUNTER — Encounter: Payer: Self-pay | Admitting: Family Medicine

## 2022-06-04 VITALS — BP 130/70 | HR 72 | Temp 98.1°F | Ht 60.0 in | Wt 173.0 lb

## 2022-06-04 DIAGNOSIS — E1140 Type 2 diabetes mellitus with diabetic neuropathy, unspecified: Secondary | ICD-10-CM

## 2022-06-04 DIAGNOSIS — E785 Hyperlipidemia, unspecified: Secondary | ICD-10-CM | POA: Diagnosis not present

## 2022-06-04 LAB — POCT GLYCOSYLATED HEMOGLOBIN (HGB A1C): Hemoglobin A1C: 6.9 % — AB (ref 4.0–5.6)

## 2022-06-04 NOTE — Patient Instructions (Signed)
a1C improved to 6.9%  Let's plan on 3 month follow up.

## 2022-06-04 NOTE — Progress Notes (Signed)
Established Patient Office Visit  Subjective   Patient ID: Mary Caldwell, female    DOB: 12/26/1945  Age: 76 y.o. MRN: [id number]  Chief Complaint  Patient presents with   Follow-up    HPI   Ms. Register is here for medical follow-up.  She has history of obesity, hypertension, type 2 diabetes, hyperlipidemia.  She seems very unsure regarding her medications.  We recently had discontinued her glimepiride but apparently she is continue to take this.  We had also started Jardiance and is not clear whether she has started that or not.  We also had recommended consideration for Ozempic and she was working with our pharmacist to see about getting on that.  She has not yet started that.  She does remain on metformin and has taken Toujeo currently 36 units once daily.  Denies any recent hypoglycemic symptoms.  Her A1c is improved today to 6.9%.  She has longstanding history of severe dyslipidemia.  She is currently on Crestor 20 mg daily.  Last cholesterol was stable but triglycerides were chronically up.  Her major complaint is ongoing severe back pain.  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  Past Medical History:  Diagnosis Date   ABDOMINAL ABSCESS 12/29/2009   ASTHMA 02/11/2009   DIABETES-TYPE 2 02/11/2009   ESSENTIAL HYPERTENSION 02/11/2009   HYPERLIPIDEMIA 02/11/2009   OBESITY 02/11/2009   Past Surgical History:  Procedure Laterality Date   [place]   steel rod left leg, now removed    reports that she has never smoked. She has never used smokeless tobacco. She reports current alcohol use. She reports that she does not use drugs. family history includes Arthritis in an other family member; Colon cancer in her sister; Diabetes in an other family member; Heart disease in an other family member. Allergies  Allergen Reactions   Atorvastatin     Does not remember   Codeine  Sulfate Nausea Only    GI upset    Review of Systems  Constitutional:  Negative for fever and malaise/fatigue.  Respiratory:  Negative for shortness of breath.   Cardiovascular:  Negative for chest pain.  Genitourinary:  Negative for dysuria.  Musculoskeletal:  Positive for back pain.  Neurological:  Negative for dizziness, weakness and headaches.      Objective:     BP 130/70 (BP Location: Left Arm, Patient Position: Sitting, Cuff Size: Normal)   Pulse 72   Temp 98.1 F (36.7 C) (Oral)   Ht 5' (1.524 m)   Wt 173 lb (78.5 kg)   SpO2 96%   BMI 33.79 kg/m    Physical Exam Constitutional:      Appearance: She is well-developed.  Eyes:     Pupils: Pupils are equal, round, and reactive to light.  Neck:     Thyroid: No thyromegaly.     Vascular: No JVD.  Cardiovascular:     Rate and Rhythm: Normal rate and regular rhythm.     Heart sounds:     No gallop.  Pulmonary:     Effort: Pulmonary effort is normal. No respiratory distress.     Breath sounds: Normal breath sounds. No wheezing or rales.  Musculoskeletal:     Cervical back: Neck supple.     Right lower leg: No edema.     Left lower leg: No edema.  Neurological:  General: No focal deficit present.     Mental Status: She is alert.      Results for orders placed or performed in visit on 06/04/22  POCT glycosylated hemoglobin (Hb A1C)  Result Value Ref Range   Hemoglobin A1C 6.9 (A) 4.0 - 5.6 %   HbA1c POC (<> result, manual entry)     HbA1c, POC (prediabetic range)     HbA1c, POC (controlled diabetic range)        The 10-year ASCVD risk score (Arnett DK, et al., 2019) is: 31.5%    Assessment & Plan:   #1 type 2 diabetes improved with A1c today 6.9%.  She does seem very confused about her medications.  Our plan was for her to come off glimepiride and start Jardiance and to consider GLP-1 medication.  We tried to go over her medication changes today.  -She would benefit from ongoing pharmacy  follow-up and especially to assist with medications and to reinforce medications. -We strongly advocated she get back on Jardiance and stop glimepiride. -Set up 42-monthfollow-up -We have suggested that she reduce her Toujeo to at least 30 units daily and hopefully can get this down even lower  #2 severe dyslipidemia.  Patient on Crestor currently.  Hopefully triglycerides will improve with improving glycemic control.   Return in about 3 months (around 09/04/2022).    BCarolann Littler MD

## 2022-06-25 ENCOUNTER — Telehealth: Payer: Self-pay

## 2022-06-25 NOTE — Telephone Encounter (Signed)
Pt Ozempic came in for pick up. Pt notified of update. Pt will come and pick up Ozempic this week. Ozempic in Back Pod mini fridge.

## 2022-06-26 IMAGING — DX DG LUMBAR SPINE COMPLETE 4+V
5 series · 5 of 5 positions shown · non-contrast
Comparison: MRI report 07/14/2006, CT chest 11/28/2020

CLINICAL DATA: Low back pain

EXAM:
LUMBAR SPINE - COMPLETE 4+ VIEW

[l-spine ap]
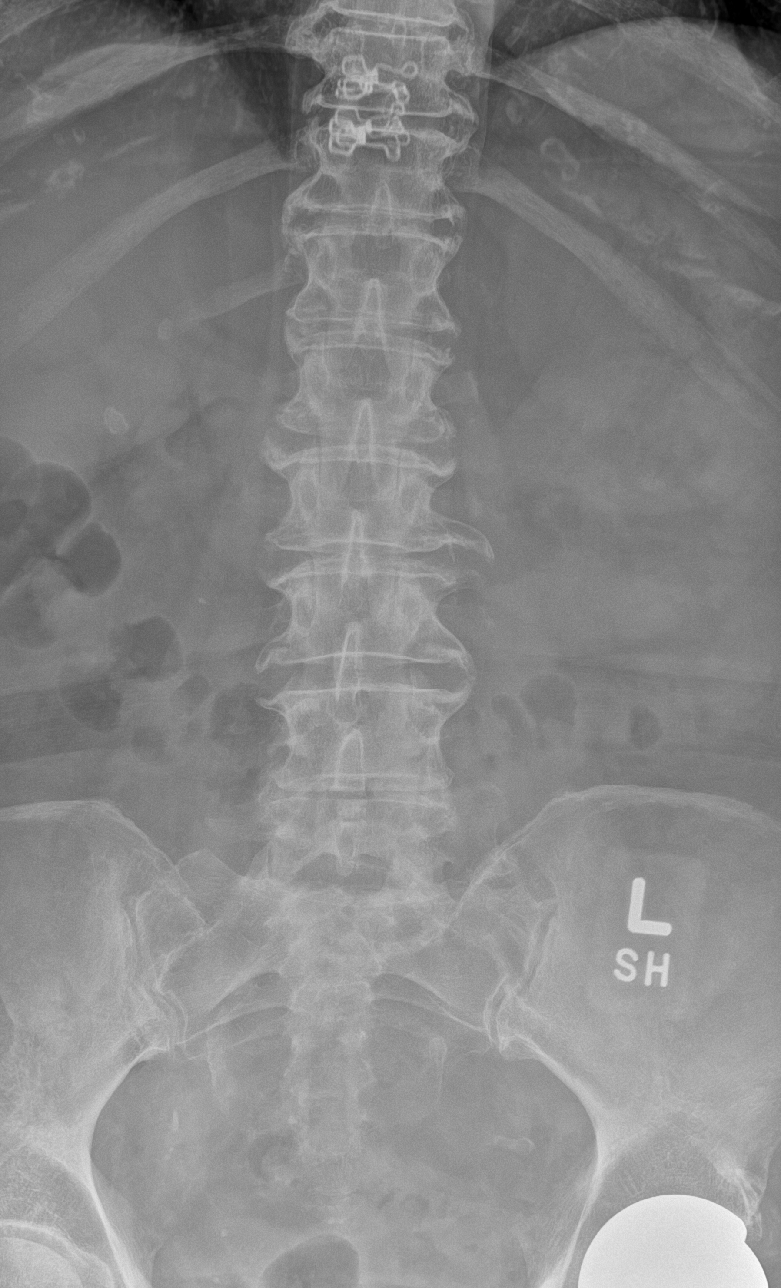

[l-spine obl (1 of 2)]
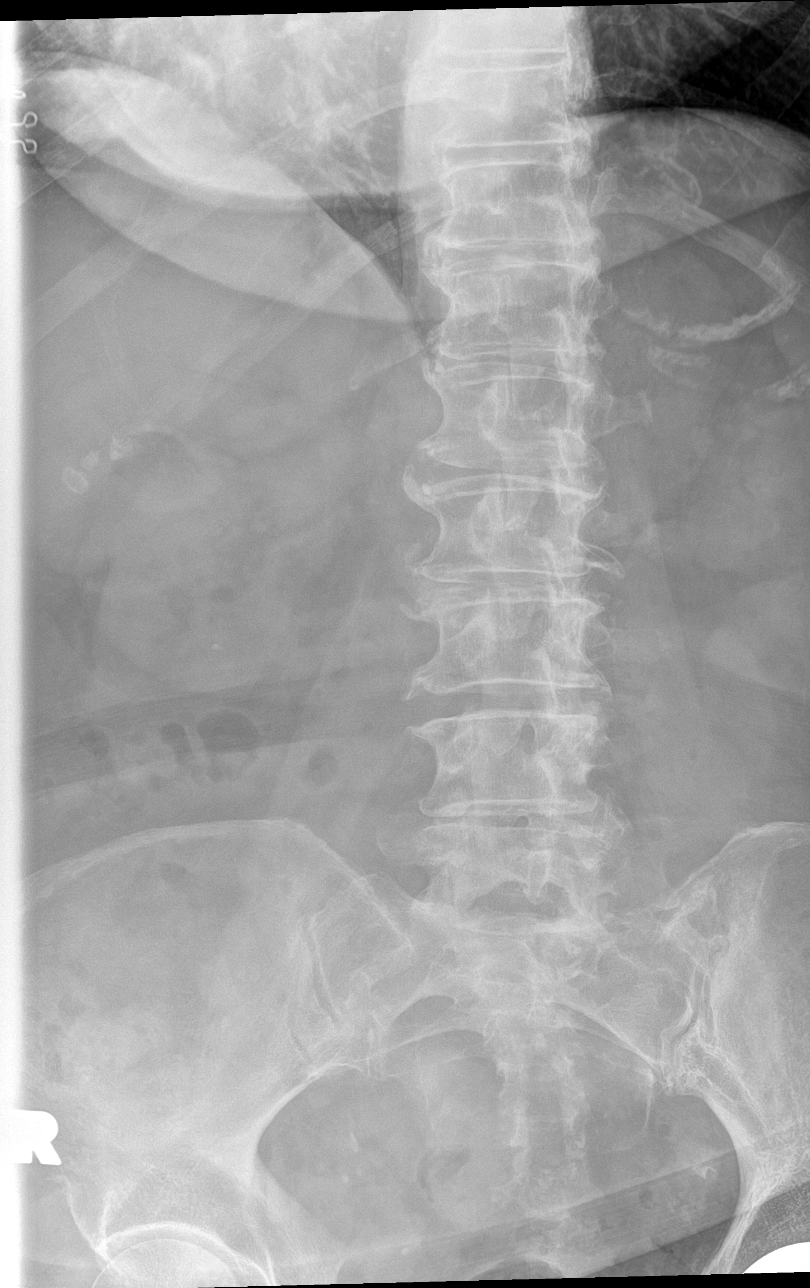

[l-spine obl (2 of 2)]
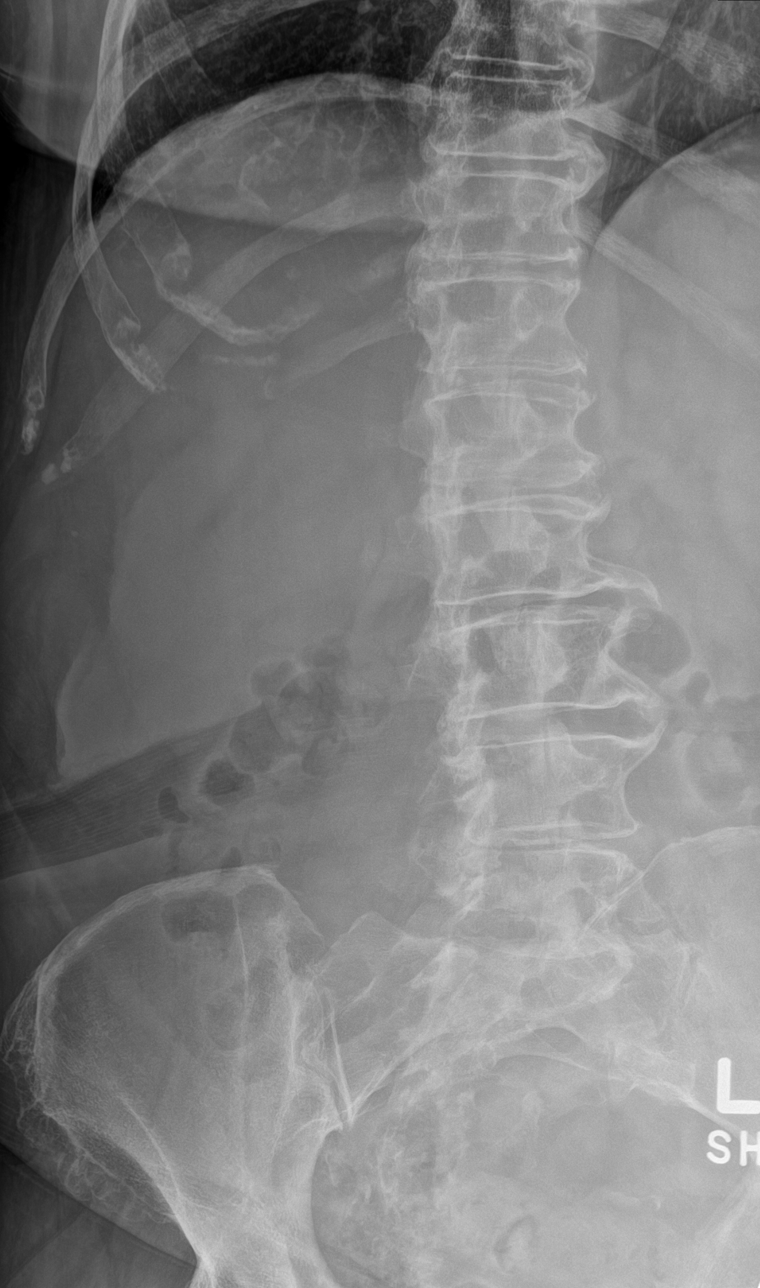

[l-spine lat]
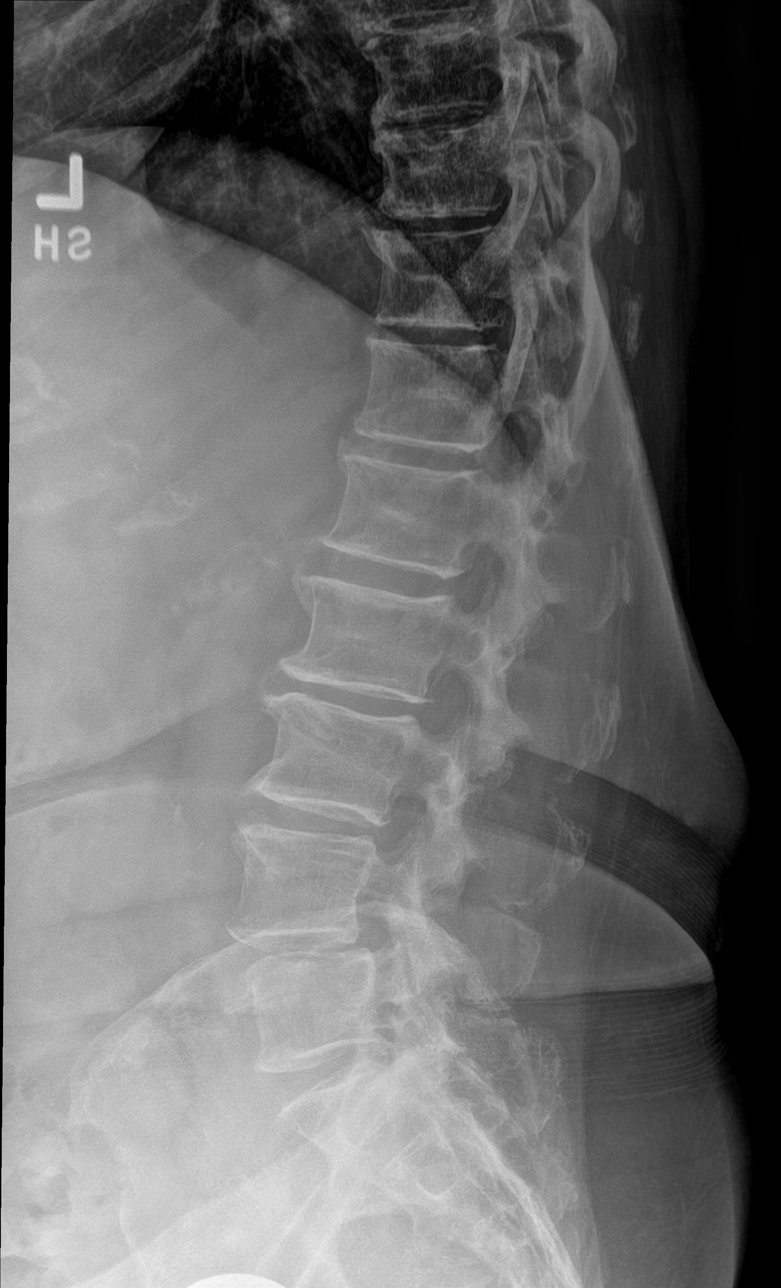

[l-spine spot]
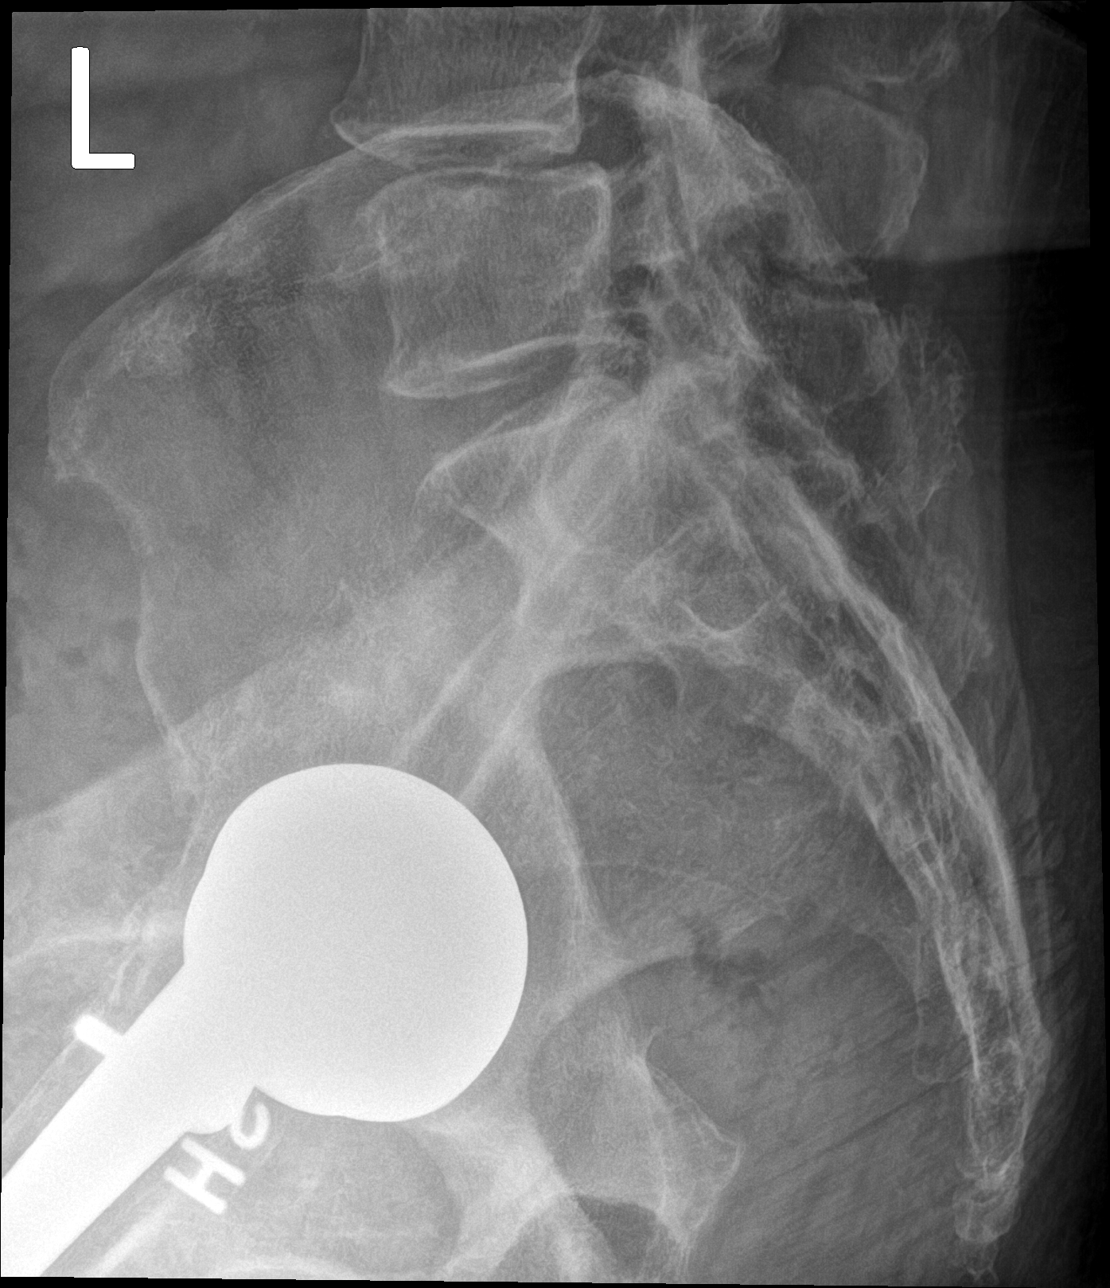

[5 of 5 positions shown; findings below may reference images not displayed]

FINDINGS: Gallstone in the right upper quadrant. Hypoplastic left twelfth rib.
Five non rib-bearing lumbar type vertebra. Grade 1 anterolisthesis
L4 on L5. Vertebral body heights are maintained. Multilevel
degenerative change with mild to moderate disc space narrowing at
L2-L3 and L4-L5. Mild degenerative changes at L3-L4. Facet
degenerative changes of the lower lumbar spine.
IMPRESSION: 1. Degenerative changes.
2. No acute osseous abnormality
3. Gallstones

## 2022-07-08 ENCOUNTER — Other Ambulatory Visit: Payer: Self-pay | Admitting: Family Medicine

## 2022-07-20 DIAGNOSIS — H2511 Age-related nuclear cataract, right eye: Secondary | ICD-10-CM | POA: Diagnosis not present

## 2022-07-20 DIAGNOSIS — H2513 Age-related nuclear cataract, bilateral: Secondary | ICD-10-CM | POA: Diagnosis not present

## 2022-07-20 DIAGNOSIS — H25043 Posterior subcapsular polar age-related cataract, bilateral: Secondary | ICD-10-CM | POA: Diagnosis not present

## 2022-07-20 DIAGNOSIS — H25013 Cortical age-related cataract, bilateral: Secondary | ICD-10-CM | POA: Diagnosis not present

## 2022-07-20 DIAGNOSIS — H18413 Arcus senilis, bilateral: Secondary | ICD-10-CM | POA: Diagnosis not present

## 2022-07-26 ENCOUNTER — Other Ambulatory Visit: Payer: Self-pay | Admitting: Family Medicine

## 2022-07-29 ENCOUNTER — Telehealth: Payer: Self-pay | Admitting: Pharmacist

## 2022-07-29 NOTE — Telephone Encounter (Signed)
Called patient to follow up on medication changes with patient assistance approvals. Unable to reach patient and left a voicemail requesting a call back.

## 2022-08-03 ENCOUNTER — Other Ambulatory Visit: Payer: Self-pay | Admitting: Family Medicine

## 2022-08-11 ENCOUNTER — Encounter: Payer: Self-pay | Admitting: Family Medicine

## 2022-08-11 ENCOUNTER — Ambulatory Visit (INDEPENDENT_AMBULATORY_CARE_PROVIDER_SITE_OTHER): Payer: Medicare HMO | Admitting: Family Medicine

## 2022-08-11 VITALS — BP 140/70 | HR 66 | Temp 98.0°F | Ht 60.0 in | Wt 167.4 lb

## 2022-08-11 DIAGNOSIS — E1140 Type 2 diabetes mellitus with diabetic neuropathy, unspecified: Secondary | ICD-10-CM | POA: Diagnosis not present

## 2022-08-11 DIAGNOSIS — Z23 Encounter for immunization: Secondary | ICD-10-CM | POA: Diagnosis not present

## 2022-08-11 DIAGNOSIS — R300 Dysuria: Secondary | ICD-10-CM | POA: Diagnosis not present

## 2022-08-11 LAB — POC URINALSYSI DIPSTICK (AUTOMATED)
Bilirubin, UA: NEGATIVE
Blood, UA: POSITIVE
Glucose, UA: POSITIVE — AB
Ketones, UA: NEGATIVE
Nitrite, UA: NEGATIVE
Protein, UA: POSITIVE — AB
Spec Grav, UA: 1.02 (ref 1.010–1.025)
Urobilinogen, UA: 0.2 E.U./dL
pH, UA: 6 (ref 5.0–8.0)

## 2022-08-11 MED ORDER — CEPHALEXIN 500 MG PO CAPS: 500.0000 mg | ORAL_CAPSULE | Freq: Three times a day (TID) | ORAL | 0 refills | Status: DC

## 2022-08-11 NOTE — Addendum Note (Signed)
Addended by: Nilda Riggs on: 08/11/2022 08:51 AM   Modules accepted: Orders

## 2022-08-11 NOTE — Progress Notes (Signed)
Established Patient Office Visit  Subjective   Patient ID: Mary Caldwell, female    DOB: 1946/01/13  Age: 76 y.o. MRN: [id number]  Chief Complaint  Patient presents with   Dysuria    Patient complains of dysuria, x4 days     HPI   Mary Caldwell is seen with 4-day history of burning with urination and urine frequency.  No fever.  No gross hematuria.  Denies any nausea, vomiting, or flank pain.  She has type 2 diabetes and does take Jardiance.  Last UTI apparently 2021.  Her diabetes has been fairly well controlled.  Most recent A1c was 6.9% July 14.  He has hyperlipidemia treated with Crestor 20 mg daily.  Past Medical History:  Diagnosis Date   ABDOMINAL ABSCESS 12/29/2009   ASTHMA 02/11/2009   DIABETES-TYPE 2 02/11/2009   ESSENTIAL HYPERTENSION 02/11/2009   HYPERLIPIDEMIA 02/11/2009   OBESITY 02/11/2009   Past Surgical History:  Procedure Laterality Date   Ethel   steel rod left leg, now removed    reports that she has never smoked. She has never used smokeless tobacco. She reports current alcohol use. She reports that she does not use drugs. family history includes Arthritis in an other family member; Colon cancer in her sister; Diabetes in an other family member; Heart disease in an other family member. Allergies  Allergen Reactions   Atorvastatin     Does not remember   Codeine Sulfate Nausea Only    GI upset    Review of Systems  Constitutional:  Negative for chills and fever.  Respiratory:  Negative for cough.   Gastrointestinal:  Negative for nausea and vomiting.  Genitourinary:  Positive for dysuria and frequency. Negative for flank pain and hematuria.      Objective:     BP (!) 140/70 (BP Location: Left Arm, Patient Position: Sitting, Cuff Size: Normal)   Pulse 66   Temp 98 F (36.7 C) (Oral)   Ht 5' (1.524 m)   Wt 167 lb 6.4 oz (75.9 kg)   SpO2 96%   BMI 32.69 kg/m  BP Readings from Last 3 Encounters:   08/11/22 (!) 140/70  06/04/22 130/70  05/12/22 (!) 142/82   Wt Readings from Last 3 Encounters:  08/11/22 167 lb 6.4 oz (75.9 kg)  06/04/22 173 lb (78.5 kg)  03/22/22 171 lb 6.4 oz (77.7 kg)      Physical Exam Vitals reviewed.  Constitutional:      Appearance: Normal appearance.  Cardiovascular:     Rate and Rhythm: Normal rate and regular rhythm.  Pulmonary:     Effort: Pulmonary effort is normal.     Breath sounds: Normal breath sounds.  Neurological:     Mental Status: She is alert.      No results found for any visits on 08/11/22.    The 10-year ASCVD risk score (Arnett DK, et al., 2019) is: 35.5%    Assessment & Plan:   #1 dysuria.  Rule out UTI.  Check urine dipstick.  Patient is on Jardiance which is a risk factor but she has not had recurrent UTIs thus far.  Urine dip suggest possible UTI.  Urine culture sent.  Hydrate well.  Start Keflex 500 mg 3 times daily for 5 days pending culture results  #2 type 2 diabetes reasonably well controlled with recent A1c 6.9%.  Set up 66-monthfollow-up   No follow-ups on file.  Carolann Littler, MD

## 2022-08-15 LAB — URINE CULTURE
MICRO NUMBER:: 13943896
SPECIMEN QUALITY:: ADEQUATE

## 2022-08-17 ENCOUNTER — Telehealth: Payer: Self-pay | Admitting: Family Medicine

## 2022-08-17 NOTE — Telephone Encounter (Signed)
Please see result note 

## 2022-08-17 NOTE — Telephone Encounter (Signed)
Pt called, returning CMA's call. CMA was unavailable. Pt asked that CMA call back at his earliest convenience. 

## 2022-08-26 ENCOUNTER — Telehealth: Payer: Self-pay | Admitting: Family Medicine

## 2022-08-26 MED ORDER — HYDROCODONE-ACETAMINOPHEN 5-325 MG PO TABS: 1.0000 | ORAL_TABLET | Freq: Four times a day (QID) | ORAL | 0 refills | Status: DC | PRN

## 2022-08-26 NOTE — Telephone Encounter (Signed)
Noted  

## 2022-08-26 NOTE — Telephone Encounter (Signed)
One refill sent.   [name] MD  Primary Care at [facility]

## 2022-08-26 NOTE — Telephone Encounter (Signed)
Pt called to ask for a refill of the:  HYDROcodone-acetaminophen (NORCO/VICODIN) 5-325 MG tablet  Pt states she is in a lot of pain.  LOV:  08/11/22  [place], [place] Phone:  [phone]  Fax:  [phone]

## 2022-08-30 ENCOUNTER — Telehealth: Payer: Self-pay | Admitting: *Deleted

## 2022-08-30 NOTE — Patient Outreach (Signed)
  Care Coordination   08/30/2022 Name: Mary Caldwell MRN: [id number] DOB: 1946/03/15   Care Coordination Outreach Attempts:  An unsuccessful telephone outreach was attempted today to offer the patient information about available care coordination services as a benefit of their health plan.   Follow Up Plan:  Additional outreach attempts will be made to offer the patient care coordination information and services.   Encounter Outcome:  No Answer  Care Coordination Interventions Activated:  No   Care Coordination Interventions:  No, not indicated    Raina Mina, RN Care Management Coordinator [place] Office [phone]

## 2022-09-02 ENCOUNTER — Telehealth: Payer: Self-pay | Admitting: *Deleted

## 2022-09-02 NOTE — Patient Outreach (Signed)
  Care Coordination   Initial Visit Note   09/02/2022 Name: CARLEE VONDERHAAR MRN: [id number] DOB: 1946-03-22  TABBETHA KUTSCHER is a 76 y.o. year old female who sees Burchette, Alinda Sierras, MD for primary care. I spoke with  [name] by phone today.  What matters to the patients health and wellness today?  No needs    Goals Addressed               This Visit's Progress     COMPLETED: No needs (pt-stated)        Care Coordination Interventions: Reviewed medications with patient and discussed adherence to all her medications and with needed refills Reviewed scheduled/upcoming provider appointments including pending appointments and completed her AWV for this year Assessed social determinant of health barriers         SDOH assessments and interventions completed:  Yes     Care Coordination Interventions Activated:  Yes  Care Coordination Interventions:  Yes, provided   Follow up plan: No further intervention required.   Encounter Outcome:  Pt. Visit Completed   Raina Mina, RN Care Management Coordinator [place] Office [phone]

## 2022-09-02 NOTE — Patient Instructions (Signed)
Visit Information  Thank you for taking time to visit with me today. Please don't hesitate to contact me if I can be of assistance to you.   Following are the goals we discussed today:   Goals Addressed               This Visit's Progress     COMPLETED: No needs (pt-stated)        Care Coordination Interventions: Reviewed medications with patient and discussed adherence to all her medications and with needed refills Reviewed scheduled/upcoming provider appointments including pending appointments and completed her AWV for this year Assessed social determinant of health barriers         Please call the care guide team at [phone] if you need to cancel or reschedule your appointment.   If you are experiencing a Mental Health or [place] or need someone to talk to, please call the Suicide and Crisis Lifeline: 988  Patient verbalizes understanding of instructions and care plan provided today and agrees to view in [place]. Active MyChart status and patient understanding of how to access instructions and care plan via MyChart confirmed with patient.    No further follow up required: No needs    Raina Mina, RN Care Management Coordinator [place] Office [phone]

## 2022-09-03 DIAGNOSIS — M48062 Spinal stenosis, lumbar region with neurogenic claudication: Secondary | ICD-10-CM | POA: Diagnosis not present

## 2022-09-03 NOTE — Telephone Encounter (Signed)
Tried to call patient again but unable to reach. Left a voicemail requesting a call back.

## 2022-09-24 DIAGNOSIS — H2512 Age-related nuclear cataract, left eye: Secondary | ICD-10-CM | POA: Diagnosis not present

## 2022-09-24 DIAGNOSIS — H2511 Age-related nuclear cataract, right eye: Secondary | ICD-10-CM | POA: Diagnosis not present

## 2022-10-01 ENCOUNTER — Telehealth: Payer: Self-pay | Admitting: Pharmacist

## 2022-10-01 NOTE — Chronic Care Management (AMB) (Signed)
Renewal applications for 2671 for Jardiance, Toujeo & Ozempic completed to be given to patient in office upcoming appointment with PCP .   [place] Clinical Pharmacist Assistant [phone]

## 2022-10-07 ENCOUNTER — Other Ambulatory Visit: Payer: Self-pay | Admitting: Family Medicine

## 2022-10-08 NOTE — Telephone Encounter (Signed)
Patient finally called me back. Scheduled her to come into the office after her appt on Monday 11/20 with PCP to fill out PAP forms.

## 2022-10-11 ENCOUNTER — Ambulatory Visit (INDEPENDENT_AMBULATORY_CARE_PROVIDER_SITE_OTHER): Payer: Medicare HMO | Admitting: Pharmacist

## 2022-10-11 ENCOUNTER — Encounter: Payer: Self-pay | Admitting: Family Medicine

## 2022-10-11 ENCOUNTER — Ambulatory Visit (INDEPENDENT_AMBULATORY_CARE_PROVIDER_SITE_OTHER): Payer: Medicare HMO | Admitting: Family Medicine

## 2022-10-11 VITALS — BP 136/66 | HR 80 | Temp 97.9°F | Ht 60.0 in | Wt 166.5 lb

## 2022-10-11 DIAGNOSIS — E785 Hyperlipidemia, unspecified: Secondary | ICD-10-CM | POA: Diagnosis not present

## 2022-10-11 DIAGNOSIS — E1140 Type 2 diabetes mellitus with diabetic neuropathy, unspecified: Secondary | ICD-10-CM | POA: Diagnosis not present

## 2022-10-11 DIAGNOSIS — I10 Essential (primary) hypertension: Secondary | ICD-10-CM

## 2022-10-11 LAB — POCT GLYCOSYLATED HEMOGLOBIN (HGB A1C): Hemoglobin A1C: 8 % — AB (ref 4.0–5.6)

## 2022-10-11 LAB — MICROALBUMIN / CREATININE URINE RATIO
Creatinine,U: 41.1 mg/dL
Microalb Creat Ratio: 1.7 mg/g (ref 0.0–30.0)
Microalb, Ur: <0.7 mg/dL (ref 0.0–1.9)

## 2022-10-11 NOTE — Patient Instructions (Addendum)
Start the Ozempic 0.25 mg once weekly Continue taking the Toujeo 40 units  Bring your blood pressure cuff to your appointment next month Figure out your Apple ID password  [place], PharmD, [place] Pharmacist [place] [facility] [place] [phone]

## 2022-10-11 NOTE — Patient Instructions (Signed)
A1C has increased to 8.0%.    Hopefully, we can get back on Ozempic and recommend see you back in 3 months for repeat A1C

## 2022-10-11 NOTE — Progress Notes (Signed)
Chronic Care Management Pharmacy Note  10/11/2022 Name:  TANICE PETRE MRN:  [id number] DOB:  08/17/1946  Summary: A1c not at goal < 7% BP not at goal < 130/80  Recommendations/Changes made from today's visit: -Recommended bringing BP cuff to next office visit to ensure accuracy -Recommended restarting Ozempic 0.25 mg -Recommended considering CGM for blood sugar monitoring  Plan: Follow up in 1 month for Ozempic titration  Subjective: PERSIA LINTNER is an 76 y.o. year old female who is a primary patient of Burchette, Alinda Sierras, MD.  The CCM team was consulted for assistance with disease management and care coordination needs.    Engaged with patient face to face for follow up visit in response to provider referral for pharmacy case management and/or care coordination services.   Consent to Services:  The patient was given information about Chronic Care Management services, agreed to services, and gave verbal consent prior to initiation of services.  Please see initial visit note for detailed documentation.   Patient Care Team: [name], MD as PCP - General Viona Gilmore, [facility] as Pharmacist (Pharmacist)  Recent office visits: 08/11/22 Carolann Littler, MD: Patient presented for dysuria. Prescribed Keflex 500 mg TID x 5 days. Follow up in 2 months for A1c recheck.  06/04/22 Carolann Littler, MD: Patient presented for DM follow up. Recommended stopping glimepiride again and restarting Jardiance. Decreased Toujeo to 30 units. Follow up in 3 months.  03/01/22 Rolene Arbour, LPN: Patient presented for AWV.  Recent consult visits: 07/20/22 Darleen Crocker (ophthalomology): Patient presented for eye exam. Unable to access notes.  03/10/22 Consuella Lose (neurosurgery): Patient presented for intervertebral disc displacement follow up. Unable to access notes.  Hospital visits: None in previous 6 months   Objective:  Lab Results  Component Value Date   CREATININE 0.85  02/15/2022   BUN 14 02/15/2022   GFR 66.88 02/15/2022   GFRNONAA >60 09/29/2021   GFRAA >60 06/08/2019   NA 130 (L) 02/15/2022   K 3.9 02/15/2022   CALCIUM 9.5 02/15/2022   CO2 29 02/15/2022   GLUCOSE 132 (H) 02/15/2022    Lab Results  Component Value Date/Time   HGBA1C 8.0 (A) 10/11/2022 08:25 AM   HGBA1C 6.9 (A) 06/04/2022 09:50 AM   HGBA1C 7.4 (H) 02/15/2022 08:59 AM   HGBA1C 7.9 (H) 08/15/2019 02:37 PM   GFR 66.88 02/15/2022 08:59 AM   GFR 84.77 08/15/2019 02:37 PM   MICROALBUR <0.7 09/13/2018 05:07 PM   MICROALBUR 1.6 01/24/2017 10:03 AM    Last diabetic Eye exam:  Lab Results  Component Value Date/Time   HMDIABEYEEXA No Retinopathy 03/16/2017 12:00 AM    Last diabetic Foot exam:  Lab Results  Component Value Date/Time   HMDIABFOOTEX normal 09/07/2013 12:00 AM     Lab Results  Component Value Date   CHOL 158 02/15/2022   HDL 39.50 02/15/2022   LDLDIRECT 70.0 02/15/2022   TRIG 397.0 (H) 02/15/2022   CHOLHDL 4 02/15/2022       Latest Ref Rng & Units 02/15/2022    8:59 AM 12/01/2020    9:21 AM 08/15/2019    2:37 PM  Hepatic Function  Total Protein 6.0 - 8.3 g/dL 7.0  6.6  6.2   Albumin 3.5 - 5.2 g/dL 4.4  4.3  4.1   AST 0 - 37 U/L _0 ALT 0 - 35 U/L _1 Alk Phosphatase 39 - 117 U/L 24  33  32   Total Bilirubin 0.2 - 1.2 mg/dL 0.6  0.4  0.4   Bilirubin, Direct 0.0 - 0.3 mg/dL  0.1  0.1     Lab Results  Component Value Date/Time   TSH 1.71 12/01/2020 09:21 AM   FREET4 0.86 12/01/2020 09:21 AM       Latest Ref Rng & Units 09/29/2021    9:18 AM 11/28/2020    5:04 PM 06/08/2019    1:40 AM  CBC  WBC 4.0 - 10.5 K/uL 9.0  8.4  7.5   Hemoglobin 12.0 - 15.0 g/dL 12.8  13.9  12.3   Hematocrit 36.0 - 46.0 % 37.2  39.8  36.7   Platelets 150 - 400 K/uL 236  288  256     No results found for: "VD25OH"  Clinical ASCVD: No  The 10-year ASCVD risk score (Arnett DK, et al., 2019) is: 33.9%   Values used to calculate the score:     Age: 56  years     Sex: Female     Is Non-Hispanic African American: No     Diabetic: Yes     Tobacco smoker: No     Systolic Blood Pressure: 427 mmHg     Is BP treated: No     HDL Cholesterol: 39.5 mg/dL     Total Cholesterol: 158 mg/dL       10/11/2022    9:06 AM 03/01/2022    1:31 PM 02/01/2022    3:24 PM  Depression screen PHQ 2/9  Decreased Interest 0 0 0  Down, Depressed, Hopeless 0 0 0  PHQ - 2 Score 0 0 0      Social History   Tobacco Use  Smoking Status Never  Smokeless Tobacco Never   BP Readings from Last 3 Encounters:  10/11/22 136/66  08/11/22 (!) 140/70  06/04/22 130/70   Pulse Readings from Last 3 Encounters:  10/11/22 80  08/11/22 66  06/04/22 72   Wt Readings from Last 3 Encounters:  10/11/22 166 lb 8 oz (75.5 kg)  08/11/22 167 lb 6.4 oz (75.9 kg)  06/04/22 173 lb (78.5 kg)   BMI Readings from Last 3 Encounters:  10/11/22 32.52 kg/m  08/11/22 32.69 kg/m  06/04/22 33.79 kg/m    Assessment/Interventions: Review of patient past medical history, allergies, medications, health status, including review of consultants reports, laboratory and other test data, was performed as part of comprehensive evaluation and provision of chronic care management services.   SDOH:  (Social Determinants of Health) assessments and interventions performed: Yes (last 05/12/22) SDOH Interventions    Flowsheet Row Chronic Care Management from 05/12/2022 in [place] at [place] from 03/01/2022 in [place] at [place] Management from 07/18/2020 in [place] at [place] Interventions -- Intervention Not Indicated --  Housing Interventions -- Intervention Not Indicated --  Transportation Interventions -- Intervention Not Indicated Intervention Not Indicated  Financial Strain Interventions Other (Comment)  [working on patient assistance] Intervention Not Indicated Intervention Not  Indicated  Physical Activity Interventions -- Intervention Not Indicated --  Stress Interventions -- Intervention Not Indicated --  Social Connections Interventions -- Intervention Not Indicated --      SDOH Screenings   Food Insecurity: No Food Insecurity (03/01/2022)  Housing: Low Risk  (03/01/2022)  Transportation Needs: No Transportation Needs (03/01/2022)  Alcohol Screen: Low Risk  (03/01/2022)  Depression (PHQ2-9): Low Risk  (03/01/2022)  Financial Resource Strain: Medium  Risk (05/12/2022)  Physical Activity: Insufficiently Active (03/01/2022)  Social Connections: Moderately Integrated (03/01/2022)  Stress: No Stress Concern Present (03/01/2022)  Tobacco Use: Low Risk  (10/11/2022)    CCM Care Plan  Allergies  Allergen Reactions   Atorvastatin     Does not remember   Codeine Sulfate Nausea Only    GI upset    Medications Reviewed Today     Reviewed by Viona Gilmore, [facility] (Pharmacist) on 10/11/22 at 0944  Med List Status: <None>   Medication Order Taking? Sig Documenting Provider Last Dose Status Informant  albuterol (VENTOLIN HFA) 108 (90 Base) MCG/ACT inhaler [id number]  Inhale 2 puffs into the lungs every 4 (four) hours as needed for wheezing or shortness of breath (or coughing). Delora Fuel, MD  Active   empagliflozin (JARDIANCE) 10 MG TABS tablet [id number]  Take 1 tablet (10 mg total) by mouth daily. [name], MD  Active   fenofibrate 160 MG tablet [id number]  Take 1 tablet by mouth once daily Burchette, Alinda Sierras, MD  Active   gabapentin (NEURONTIN) 100 MG capsule [id number]  Take 1 tablet every 8 hours as needed. [name], MD  Active   HYDROcodone-acetaminophen (NORCO/VICODIN) 5-325 MG tablet [id number]  Take 1 tablet by mouth every 6 (six) hours as needed for moderate pain. Burchette, Alinda Sierras, MD  Active   insulin glargine, 2 Unit Dial, (TOUJEO MAX SOLOSTAR) 300 UNIT/ML Solostar Pen [id number]  INJECT 20 UNITS SUBCUTANEOUSLY ONCE DAILY AT BEDTIME   Patient taking differently: INJECT 30 UNITS SUBCUTANEOUSLY ONCE DAILY AT BEDTIME   [name], MD  Active   metFORMIN (GLUCOPHAGE) 500 MG tablet [id number]  TAKE 2 TABLETS BY MOUTH TWICE DAILY WITH FOOD Burchette, Alinda Sierras, MD  Active   omeprazole (PRILOSEC) 20 MG capsule [id number]  Take 1 capsule (20 mg total) by mouth daily. Maudie Flakes, MD  Active   rosuvastatin (CRESTOR) 20 MG tablet [id number]  TAKE 1 TABLET BY MOUTH AT BEDTIME Burchette, Alinda Sierras, MD  Active   Semaglutide,0.25 or 0.5MG/DOS, (OZEMPIC, 0.25 OR 0.5 MG/DOSE,) 2 MG/1.5ML SOPN [id number]  Inject 0.25 mg into the skin once a week. [provider]  Active             Patient Active Problem List   Diagnosis Date Noted   Hypoxia 04/18/2016   Obesity (BMI 30-39.9) 09/07/2013   ABDOMINAL ABSCESS 12/29/2009   Type 2 diabetes, controlled, with neuropathy ([place]) 02/11/2009   Hyperlipidemia 02/11/2009   OBESITY 02/11/2009   Essential hypertension 02/11/2009   ASTHMA 02/11/2009    Immunization History  Administered Date(s) Administered   Fluad Quad(high Dose 65+) 08/11/2022   Influenza Split 10/13/2011   Influenza, High Dose Seasonal PF 12/03/2015, 09/27/2016, 09/13/2018   Influenza,inj,Quad PF,6+ Mos 09/07/2013, 10/01/2014   PFIZER(Purple Top)SARS-COV-2 Vaccination 07/29/2020, 08/25/2020   Pneumococcal Conjugate-13 12/03/2015   Pneumococcal Polysaccharide-23 09/07/2013   Td 11/22/2005   Tdap 03/20/2021   Patient was very confused about her medications and in general. Patient is still not taking the Ozempic despite receiving from patient assistance months ago. Patient was unable to provide a clear reason as to why and did not contact the office to inquire.  Patient is still injecting 40 units of the Toujeo and was at one point self titrating but stopped doing this and is unsure why. She thinks her A1c is up from more goodies being in the house. Patient could not provide any recent blood sugar  readings.  Patient agreed to try  a Dexcom sample for 10 days but then could not figure out her apple ID password. Will try again at next visit.  Conditions to be addressed/monitored:  Hypertension, Hyperlipidemia, Diabetes, GERD, and Pain  Conditions addressed this visit: Hypertension, diabetes  Care Plan : CCM Pharmacy Care Plan  Updates made by Viona Gilmore, [place] since 10/11/2022 12:00 AM     Problem: Problem: Hypertension, Hyperlipidemia, Diabetes, GERD, and Pain      Long-Range Goal: Patient-Specific Goal   Start Date: 05/12/2022  Expected End Date: 05/13/2023  Recent Progress: On track  Priority: High  Note:   Current Barriers:  Unable to independently afford treatment regimen Unable to independently monitor therapeutic efficacy Unable to achieve control of triglycerides  Unable to maintain control of diabetes  Pharmacist Clinical Goal(s):  Patient will verbalize ability to afford treatment regimen achieve adherence to monitoring guidelines and medication adherence to achieve therapeutic efficacy achieve control of triglycerides as evidenced by next lipid panel maintain control of diabetes as evidenced by A1c  through collaboration with PharmD and provider.   Interventions: 1:1 collaboration with [name], MD regarding development and update of comprehensive plan of care as evidenced by provider attestation and co-signature Inter-disciplinary care team collaboration (see longitudinal plan of care) Comprehensive medication review performed; medication list updated in electronic medical record  Hypertension (BP goal <130/80) -Uncontrolled -Current treatment: No medications -Medications previously tried: none  -Current home readings: not checking at home often; owns an arm cuff and unsure if it's accurate; owns a wrist cuff as well -Current dietary habits: tries not to have a lot of sodium, cut out potato chips; does eat out with neighbor once a  week -Current exercise habits: walks a couple days a week with neighbor -Denies hypotensive/hypertensive symptoms -Educated on BP goals and benefits of medications for prevention of heart attack, stroke and kidney damage; Daily salt intake goal < 2300 mg; Importance of home blood pressure monitoring; -Counseled to monitor BP at home 1-2 times a week, document, and provide log at future appointments -Counseled on diet and exercise extensively Recommended bringing BP cuff to next office visit to ensure accuracy.  Hyperlipidemia: (LDL goal < 70) -Not ideally controlled; TGs not at goal < 150 -Current treatment: Rosuvastatin 20 mg 1 tablet at bedtime - Appropriate, Query effective, Safe, Accessible Fenofibrate 160 mg 1 tablet daily - Appropriate, Query effective, Safe, Accessible -Medications previously tried: none  -Current dietary patterns: eating at home mostly; tries to limits sweets -Current exercise habits: walking some -Educated on Cholesterol goals;  Benefits of statin for ASCVD risk reduction; Importance of limiting foods high in cholesterol; Exercise goal of 150 minutes per week; -Counseled on diet and exercise extensively Recommended to continue current medication  Diabetes (A1c goal <7%) -Uncontrolled -Current medications: Jardiance 10 mg 1 tablet daily - Appropriate, Query effective, Safe, Query accessible Toujeo 300 units/mL injecting 40 units daily - Appropriate, Query effective, Safe, Query accessible Metformin 500 mg 2 tablets twice daily - Appropriate, Query effective, Safe, Accessible Ozempic - has not started -Medications previously tried: Ozempic (cost), glimepiride (not needed)  -Current home glucose readings fasting glucose: 120s, 110-120s post prandial glucose: 140 (before dinner)  -Denies hypoglycemic/hyperglycemic symptoms -Current meal patterns:  3 meals a day breakfast: scrambled eggs and toast and sometimes bacon, coffee  lunch: salad, half of sandwich   dinner: small piece of meat; more vegetarian; roll, vegetables snacks: backed down on snacking before bed drinks: unsweet tea, water some -Current exercise: back pain limits her,  walks in neighborhood some with neighbor a couple days a week; signed up for the [facility] - thinking about swimming classes -Educated on A1c and blood sugar goals; Complications of diabetes including kidney damage, retinal damage, and cardiovascular disease; Benefits of routine self-monitoring of blood sugar; Continuous glucose monitoring; Carbohydrate counting and/or plate method -Counseled to check feet daily and get yearly eye exams -Counseled on diet and exercise extensively Recommended restarting Ozempic 0.25 mg weekly.  Pain (Goal: minimize pain) -Not ideally controlled -Current treatment  Gabapentin 100 mg 1 capsule every 8 hours as needed - Appropriate, Query effective, Safe, Accessible Hydrocodone-APAP 5-325 mg 1 tablet every 6 hours as needed - Appropriate, Query effective, Query Safe, Accessible -Medications previously tried: n/a  -Recommended to continue current medication  GERD (Goal: minimize symptoms) -Controlled -Current treatment  Omeprazole 20 mg 1 capsule daily - Appropriate, Effective, Query Safe, Accessible -Medications previously tried: none  - Reassess the need at follow up.  Health Maintenance -Vaccine gaps: shingrix, COVID booster -Current therapy:  Aspirin 81 mg 1 tablet daily -Educated on Herbal supplement research is limited and benefits usually cannot be proven -Patient is satisfied with current therapy and denies issues - Reassess the need for aspirin at follow up.  Patient Goals/Self-Care Activities Patient will:  - check glucose daily, document, and provide at future appointments check blood pressure 1-2 times a week, document, and provide at future appointments collaborate with provider on medication access solutions target a minimum of 150 minutes of moderate intensity  exercise weekly  Follow Up Plan: Face to Face appointment with care management team member scheduled for: 1 month       Medication Assistance:  Ozempic, Toujeo and Jardiance obtained through [place] [facility] medication assistance program.  Enrollment ends 11/21/22  Compliance/Adherence/Medication fill history: Care Gaps: Eye exam, COVID booster, foot exam, shingrix, DEXA, colonoscopy, Hep C screening, urine microalbumin Last BP: 136/66 (10/11/22) Last A1c: 8.0% (10/11/22)  Star-Rating Drugs: Rosuvastatin (Crestor) 20 mg - Last filled 07/08/22 90 DS at [facility] Metformin (Glucophage) 500 mg - Last filled 08/03/22 90 DS at [facility]  Jardiance 10 mg - Last filled 10/07/22 for 30 DS at [facility]   Patient's preferred pharmacy is:  [place], [place] - [address] [place] [address] Phone: [phone] Fax: [phone]  [id number] [place] [address]: [phone]: [phone] Uses pill box? Yes Pt endorses 99% compliance  We discussed: Current pharmacy is preferred with insurance plan and patient is satisfied with pharmacy services Patient decided to: Continue current medication management strategy  Care Plan and Follow Up Patient Decision:  Patient agrees to Care Plan and Follow-up.  Plan: Face to Face appointment with care management team member scheduled for: 1 month  MJeni Salles PharmD, BSt. LiboryPharmacist LCerro Gordoat [phone]

## 2022-10-11 NOTE — Progress Notes (Signed)
Established Patient Office Visit  Subjective   Patient ID: Mary Caldwell, female    DOB: [birth year]  Age: 76 y.o. MRN: [id number]  Chief Complaint  Patient presents with   Follow-up    HPI   Ms. Politano seen for medical follow-up.  Her chronic problems include obesity, type 2 diabetes, hypertension, hyperlipidemia.  She states she is scheduled for cataract surgery December 1.  Recent poor compliance with diet.  She is currently treated with Toujeo, metformin, and Jardiance.  Her A1c's had been steadily improving from 7.6 to 7.4 to 6.9 last visit.  Unfortunately, A1c back up to 8.0 today.  She did previously take Ozempic but cost was prohibitive.  No recent vaginitis or UTI symptoms on Jardiance.  She has not had recent urine microalbumin.  Lipids and chemistries were last checked last March.  She has history of very high triglycerides but overall stable.  She continues to have chronic back pain and is followed by neurosurgery.  It sounds like she is getting epidurals every 3 months which helped temporarily.  Back pain greatly limits her activities.  Past Medical History:  Diagnosis Date   ABDOMINAL ABSCESS 12/29/2009   ASTHMA 02/11/2009   DIABETES-TYPE 2 02/11/2009   ESSENTIAL HYPERTENSION 02/11/2009   HYPERLIPIDEMIA 02/11/2009   OBESITY 02/11/2009   Past Surgical History:  Procedure Laterality Date   [place]   steel rod left leg, now removed    reports that she has never smoked. She has never used smokeless tobacco. She reports current alcohol use. She reports that she does not use drugs. family history includes Arthritis in an other family member; Colon cancer in her sister; Diabetes in an other family member; Heart disease in an other family member. Allergies  Allergen Reactions   Atorvastatin     Does not remember   Codeine Sulfate Nausea Only    GI upset    Review of Systems  Constitutional:  Negative for chills,  fever and weight loss.  Respiratory:  Negative for shortness of breath.   Cardiovascular:  Negative for chest pain.  Genitourinary:  Negative for dysuria.  Neurological:  Negative for dizziness, weakness and headaches.      Objective:     BP 136/66 (BP Location: Left Arm, Patient Position: Sitting, Cuff Size: Normal)   Pulse 80   Temp 97.9 F (36.6 C) (Oral)   Ht 5' (1.524 m)   Wt 166 lb 8 oz (75.5 kg)   SpO2 95%   BMI 32.52 kg/m  BP Readings from Last 3 Encounters:  10/11/22 136/66  08/11/22 (!) 140/70  06/04/22 130/70   Wt Readings from Last 3 Encounters:  10/11/22 166 lb 8 oz (75.5 kg)  08/11/22 167 lb 6.4 oz (75.9 kg)  06/04/22 173 lb (78.5 kg)      Physical Exam Constitutional:      Appearance: She is well-developed.  Eyes:     Pupils: Pupils are equal, round, and reactive to light.  Neck:     Thyroid: No thyromegaly.     Vascular: No JVD.  Cardiovascular:     Rate and Rhythm: Normal rate and regular rhythm.     Heart sounds:     No gallop.  Pulmonary:     Effort: Pulmonary effort is normal. No respiratory distress.     Breath sounds: Normal breath sounds. No wheezing or rales.  Musculoskeletal:     Cervical back: Neck  supple.  Skin:    Comments: Feet reveal no skin lesions. Good distal foot pulses. Good capillary refill. No calluses. Normal sensation with monofilament testing   Neurological:     Mental Status: She is alert.      Results for orders placed or performed in visit on 10/11/22  POCT glycosylated hemoglobin (Hb A1C)  Result Value Ref Range   Hemoglobin A1C 8.0 (A) 4.0 - 5.6 %   HbA1c POC (<> result, manual entry)     HbA1c, POC (prediabetic range)     HbA1c, POC (controlled diabetic range)      Last CBC Lab Results  Component Value Date   WBC 9.0 09/29/2021   HGB 12.8 09/29/2021   HCT 37.2 09/29/2021   MCV 90.5 09/29/2021   MCH 31.1 09/29/2021   RDW 12.6 09/29/2021   PLT 236 39/76/7341   Last metabolic panel Lab Results   Component Value Date   GLUCOSE 132 (H) 02/15/2022   NA 130 (L) 02/15/2022   K 3.9 02/15/2022   CL 94 (L) 02/15/2022   CO2 29 02/15/2022   BUN 14 02/15/2022   CREATININE 0.85 02/15/2022   GFRNONAA >60 09/29/2021   CALCIUM 9.5 02/15/2022   PROT 7.0 02/15/2022   ALBUMIN 4.4 02/15/2022   BILITOT 0.6 02/15/2022   ALKPHOS 24 (L) 02/15/2022   AST 25 02/15/2022   ALT 15 02/15/2022   ANIONGAP 7 09/29/2021   Last lipids Lab Results  Component Value Date   CHOL 158 02/15/2022   HDL 39.50 02/15/2022   LDLDIRECT 70.0 02/15/2022   TRIG 397.0 (H) 02/15/2022   CHOLHDL 4 02/15/2022   Last hemoglobin A1c Lab Results  Component Value Date   HGBA1C 8.0 (A) 10/11/2022   Last thyroid functions Lab Results  Component Value Date   TSH 1.71 12/01/2020      The 10-year ASCVD risk score (Arnett DK, et al., 2019) is: 33.9%    Assessment & Plan:   #1 type 2 diabetes suboptimally controlled with A1c today 8.0%.  She is working with our pharmacist to look at coverage for Ozempic.  Felt like she would benefit from Ozempic long-term if we can get this covered. -Check urine microalbumin screen today -We will plan to see back in 3 months for repeat A1c after transitioning to [place].  She does meet with our clinical pharmacist later today to discuss this. -Discussed good diabetic foot care  #2 dyslipidemia treated with rosuvastatin and fenofibrate.  Lipids were checked in March and reviewed.  Continue current regimen.   Return in about 3 months (around 01/11/2023).    Carolann Littler, MD

## 2022-10-16 ENCOUNTER — Other Ambulatory Visit: Payer: Self-pay | Admitting: Family Medicine

## 2022-10-21 ENCOUNTER — Other Ambulatory Visit: Payer: Self-pay | Admitting: Family Medicine

## 2022-10-21 DIAGNOSIS — E1140 Type 2 diabetes mellitus with diabetic neuropathy, unspecified: Secondary | ICD-10-CM

## 2022-10-21 DIAGNOSIS — I10 Essential (primary) hypertension: Secondary | ICD-10-CM

## 2022-10-22 DIAGNOSIS — H2512 Age-related nuclear cataract, left eye: Secondary | ICD-10-CM | POA: Diagnosis not present

## 2022-11-05 NOTE — Progress Notes (Signed)
Chronic Care Management Pharmacy Note  11/08/2022 Name:  Mary Caldwell MRN:  [id number] DOB:  07-06-1946  Summary: A1c not at goal < 7% BP not at goal < 130/80  Recommendations/Changes made from today's visit: -Recommended bringing BP cuff to next office visit to ensure accuracy -Recommended increasing Ozempic to 0.5 mg -Recommended considering CGM for blood sugar monitoring -Recommended keeping a log of blood sugar readings  Plan: Follow up in 2-3 weeks for blood sugar readings  Subjective: Mary Caldwell is an 76 y.o. year old female who is a primary patient of Burchette, Alinda Sierras, MD.  The CCM team was consulted for assistance with disease management and care coordination needs.    Engaged with patient face to face for follow up visit in response to provider referral for pharmacy case management and/or care coordination services.   Consent to Services:  The patient was given information about Chronic Care Management services, agreed to services, and gave verbal consent prior to initiation of services.  Please see initial visit note for detailed documentation.   Patient Care Team: [name], MD as PCP - General Viona Gilmore, [facility] as Pharmacist (Pharmacist)  Recent office visits: 10/11/22 Mary Littler, MD: Patient presented for DM follow up. A1c increased to 8.0%. Re-started Ozempic 0.25 mg daily. D/c'd aspirin and cephalexin. Follow up in 3 months.  08/11/22 Mary Littler, MD: Patient presented for dysuria. Prescribed Keflex 500 mg TID x 5 days. Follow up in 2 months for A1c recheck.  06/04/22 Mary Littler, MD: Patient presented for DM follow up. Recommended stopping glimepiride again and restarting Jardiance. Decreased Toujeo to 30 units. Follow up in 3 months.  03/01/22 Rolene Arbour, LPN: Patient presented for AWV.  Recent consult visits: 07/20/22 Darleen Crocker (ophthalomology): Patient presented for eye exam. Unable to access notes.  03/10/22 Consuella Lose (neurosurgery): Patient presented for intervertebral disc displacement follow up. Unable to access notes.  Hospital visits: None in previous 6 months   Objective:  Lab Results  Component Value Date   CREATININE 0.85 02/15/2022   BUN 14 02/15/2022   GFR 66.88 02/15/2022   GFRNONAA >60 09/29/2021   GFRAA >60 06/08/2019   NA 130 (L) 02/15/2022   K 3.9 02/15/2022   CALCIUM 9.5 02/15/2022   CO2 29 02/15/2022   GLUCOSE 132 (H) 02/15/2022    Lab Results  Component Value Date/Time   HGBA1C 8.0 (A) 10/11/2022 08:25 AM   HGBA1C 6.9 (A) 06/04/2022 09:50 AM   HGBA1C 7.4 (H) 02/15/2022 08:59 AM   HGBA1C 7.9 (H) 08/15/2019 02:37 PM   GFR 66.88 02/15/2022 08:59 AM   GFR 84.77 08/15/2019 02:37 PM   MICROALBUR <0.7 10/11/2022 09:04 AM   MICROALBUR <0.7 09/13/2018 05:07 PM    Last diabetic Eye exam:  Lab Results  Component Value Date/Time   HMDIABEYEEXA No Retinopathy 03/16/2017 12:00 AM    Last diabetic Foot exam:  Lab Results  Component Value Date/Time   HMDIABFOOTEX normal 09/07/2013 12:00 AM     Lab Results  Component Value Date   CHOL 158 02/15/2022   HDL 39.50 02/15/2022   LDLDIRECT 70.0 02/15/2022   TRIG 397.0 (H) 02/15/2022   CHOLHDL 4 02/15/2022       Latest Ref Rng & Units 02/15/2022    8:59 AM 12/01/2020    9:21 AM 08/15/2019    2:37 PM  Hepatic Function  Total Protein 6.0 - 8.3 g/dL 7.0  6.6  6.2   Albumin 3.5 - 5.2 g/dL  4.4  4.3  4.1   AST 0 - 37 U/L _0 ALT 0 - 35 U/L _1 Alk Phosphatase 39 - 117 U/L 24  33  32   Total Bilirubin 0.2 - 1.2 mg/dL 0.6  0.4  0.4   Bilirubin, Direct 0.0 - 0.3 mg/dL  0.1  0.1     Lab Results  Component Value Date/Time   TSH 1.71 12/01/2020 09:21 AM   FREET4 0.86 12/01/2020 09:21 AM       Latest Ref Rng & Units 09/29/2021    9:18 AM 11/28/2020    5:04 PM 06/08/2019    1:40 AM  CBC  WBC 4.0 - 10.5 K/uL 9.0  8.4  7.5   Hemoglobin 12.0 - 15.0 g/dL 12.8  13.9  12.3   Hematocrit 36.0 - 46.0 % 37.2   39.8  36.7   Platelets 150 - 400 K/uL 236  288  256     No results found for: "VD25OH"  Clinical ASCVD: No  The 10-year ASCVD risk score (Arnett DK, et al., 2019) is: 33.9%   Values used to calculate the score:     Age: 76 years     Sex: Female     Is Non-Hispanic African American: No     Diabetic: Yes     Tobacco smoker: No     Systolic Blood Pressure: 929 mmHg     Is BP treated: No     HDL Cholesterol: 39.5 mg/dL     Total Cholesterol: 158 mg/dL       10/11/2022    9:06 AM 03/01/2022    1:31 PM 02/01/2022    3:24 PM  Depression screen PHQ 2/9  Decreased Interest 0 0 0  Down, Depressed, Hopeless 0 0 0  PHQ - 2 Score 0 0 0      Social History   Tobacco Use  Smoking Status Never  Smokeless Tobacco Never   BP Readings from Last 3 Encounters:  10/11/22 136/66  08/11/22 (!) 140/70  06/04/22 130/70   Pulse Readings from Last 3 Encounters:  10/11/22 80  08/11/22 66  06/04/22 72   Wt Readings from Last 3 Encounters:  10/11/22 166 lb 8 oz (75.5 kg)  08/11/22 167 lb 6.4 oz (75.9 kg)  06/04/22 173 lb (78.5 kg)   BMI Readings from Last 3 Encounters:  10/11/22 32.52 kg/m  08/11/22 32.69 kg/m  06/04/22 33.79 kg/m    Assessment/Interventions: Review of patient past medical history, allergies, medications, health status, including review of consultants reports, laboratory and other test data, was performed as part of comprehensive evaluation and provision of chronic care management services.   SDOH:  (Social Determinants of Health) assessments and interventions performed: Yes (last 05/12/22) SDOH Interventions    Flowsheet Row Chronic Care Management from 05/12/2022 in [place] at [place] from 03/01/2022 in [place] at [place] Management from 07/18/2020 in [place] at [place] Interventions -- Intervention Not Indicated --  Housing Interventions -- Intervention  Not Indicated --  Transportation Interventions -- Intervention Not Indicated Intervention Not Indicated  Financial Strain Interventions Other (Comment)  [working on patient assistance] Intervention Not Indicated Intervention Not Indicated  Physical Activity Interventions -- Intervention Not Indicated --  Stress Interventions -- Intervention Not Indicated --  Social Connections Interventions -- Intervention Not Indicated --      SDOH Screenings  Food Insecurity: No Food Insecurity (03/01/2022)  Housing: Low Risk  (03/01/2022)  Transportation Needs: No Transportation Needs (03/01/2022)  Alcohol Screen: Low Risk  (03/01/2022)  Depression (PHQ2-9): Low Risk  (10/11/2022)  Financial Resource Strain: Medium Risk (05/12/2022)  Physical Activity: Insufficiently Active (03/01/2022)  Social Connections: Moderately Integrated (03/01/2022)  Stress: No Stress Concern Present (03/01/2022)  Tobacco Use: Low Risk  (10/11/2022)    CCM Care Plan  Allergies  Allergen Reactions   Atorvastatin     Does not remember   Codeine Sulfate Nausea Only    GI upset    Medications Reviewed Today     Reviewed by Viona Gilmore, [facility] (Pharmacist) on 10/11/22 at 0944  Med List Status: <None>   Medication Order Taking? Sig Documenting Provider Last Dose Status Informant  albuterol (VENTOLIN HFA) 108 (90 Base) MCG/ACT inhaler [id number]  Inhale 2 puffs into the lungs every 4 (four) hours as needed for wheezing or shortness of breath (or coughing). Delora Fuel, MD  Active   empagliflozin (JARDIANCE) 10 MG TABS tablet [id number]  Take 1 tablet (10 mg total) by mouth daily. [name], MD  Active   fenofibrate 160 MG tablet [id number]  Take 1 tablet by mouth once daily Burchette, Alinda Sierras, MD  Active   gabapentin (NEURONTIN) 100 MG capsule [id number]  Take 1 tablet every 8 hours as needed. [name], MD  Active   HYDROcodone-acetaminophen (NORCO/VICODIN) 5-325 MG tablet [id number]  Take 1 tablet by mouth  every 6 (six) hours as needed for moderate pain. Burchette, Alinda Sierras, MD  Active   insulin glargine, 2 Unit Dial, (TOUJEO MAX SOLOSTAR) 300 UNIT/ML Solostar Pen [id number]  INJECT 20 UNITS SUBCUTANEOUSLY ONCE DAILY AT BEDTIME  Patient taking differently: INJECT 30 UNITS SUBCUTANEOUSLY ONCE DAILY AT BEDTIME   [name], MD  Active   metFORMIN (GLUCOPHAGE) 500 MG tablet [id number]  TAKE 2 TABLETS BY MOUTH TWICE DAILY WITH FOOD Burchette, Alinda Sierras, MD  Active   omeprazole (PRILOSEC) 20 MG capsule [id number]  Take 1 capsule (20 mg total) by mouth daily. Maudie Flakes, MD  Active   rosuvastatin (CRESTOR) 20 MG tablet [id number]  TAKE 1 TABLET BY MOUTH AT BEDTIME Burchette, Alinda Sierras, MD  Active   Semaglutide,0.25 or 0.5MG/DOS, (OZEMPIC, 0.25 OR 0.5 MG/DOSE,) 2 MG/1.5ML SOPN [id number]  Inject 0.25 mg into the skin once a week. [provider]  Active             Patient Active Problem List   Diagnosis Date Noted   Hypoxia 04/18/2016   Obesity (BMI 30-39.9) 09/07/2013   ABDOMINAL ABSCESS 12/29/2009   Type 2 diabetes, controlled, with neuropathy ([place]) 02/11/2009   Hyperlipidemia 02/11/2009   OBESITY 02/11/2009   Essential hypertension 02/11/2009   ASTHMA 02/11/2009    Immunization History  Administered Date(s) Administered   Fluad Quad(high Dose 65+) 08/11/2022   Influenza Split 10/13/2011   Influenza, High Dose Seasonal PF 12/03/2015, 09/27/2016, 09/13/2018   Influenza,inj,Quad PF,6+ Mos 09/07/2013, 10/01/2014   PFIZER(Purple Top)SARS-COV-2 Vaccination 07/29/2020, 08/25/2020   Pneumococcal Conjugate-13 12/03/2015   Pneumococcal Polysaccharide-23 09/07/2013   Td 11/22/2005   Tdap 03/20/2021   Patient forgot everything discussed at the last visit other than starting on the Ozempic. She did in fact start on the 0.25 mg dose and has been injecting on Wednesdays.  Patient did not bring her BP cuff and still did not ask her son about her Apple ID password so she was unable to  download the  app for the [facility].  Patient also could not provide any recent blood sugars readings. She reports she thinks they have been higher lately but could not describe what that meant or give a range for readings or anything.   Conditions to be addressed/monitored:  Hypertension, Hyperlipidemia, Diabetes, GERD, and Pain  Conditions addressed this visit: Hypertension, diabetes  Care Plan : CCM Pharmacy Care Plan  Updates made by Viona Gilmore, [place] since 11/08/2022 12:00 AM     Problem: Problem: Hypertension, Hyperlipidemia, Diabetes, GERD, and Pain      Long-Range Goal: Patient-Specific Goal   Start Date: 05/12/2022  Expected End Date: 05/13/2023  Recent Progress: On track  Priority: High  Note:   Current Barriers:  Unable to independently afford treatment regimen Unable to independently monitor therapeutic efficacy Unable to achieve control of triglycerides  Unable to maintain control of diabetes  Pharmacist Clinical Goal(s):  Patient will verbalize ability to afford treatment regimen achieve adherence to monitoring guidelines and medication adherence to achieve therapeutic efficacy achieve control of triglycerides as evidenced by next lipid panel maintain control of diabetes as evidenced by A1c  through collaboration with PharmD and provider.   Interventions: 1:1 collaboration with [name], MD regarding development and update of comprehensive plan of care as evidenced by provider attestation and co-signature Inter-disciplinary care team collaboration (see longitudinal plan of care) Comprehensive medication review performed; medication list updated in electronic medical record  Hypertension (BP goal <130/80) -Uncontrolled -Current treatment: No medications -Medications previously tried: none  -Current home readings: not checking at home often; owns an arm cuff and unsure if it's accurate; owns a wrist cuff as well -Current dietary habits: tries not to  have a lot of sodium, cut out potato chips; does eat out with neighbor once a week -Current exercise habits: walks a couple days a week with neighbor -Denies hypotensive/hypertensive symptoms -Educated on BP goals and benefits of medications for prevention of heart attack, stroke and kidney damage; Daily salt intake goal < 2300 mg; Importance of home blood pressure monitoring; -Counseled to monitor BP at home 1-2 times a week, document, and provide log at future appointments -Counseled on diet and exercise extensively Recommended bringing BP cuff to next office visit to ensure accuracy.  Hyperlipidemia: (LDL goal < 70) -Not ideally controlled; TGs not at goal < 150 -Current treatment: Rosuvastatin 20 mg 1 tablet at bedtime - Appropriate, Query effective, Safe, Accessible Fenofibrate 160 mg 1 tablet daily - Appropriate, Query effective, Safe, Accessible -Medications previously tried: none  -Current dietary patterns: eating at home mostly; tries to limits sweets -Current exercise habits: walking some -Educated on Cholesterol goals;  Benefits of statin for ASCVD risk reduction; Importance of limiting foods high in cholesterol; Exercise goal of 150 minutes per week; -Counseled on diet and exercise extensively Recommended to continue current medication  Diabetes (A1c goal <7%) -Uncontrolled -Current medications: Jardiance 10 mg 1 tablet daily - Appropriate, Query effective, Safe, Query accessible Toujeo 300 units/mL injecting 40 units daily - Appropriate, Query effective, Safe, Query accessible Metformin 500 mg 2 tablets twice daily - Appropriate, Query effective, Safe, Accessible Ozempic 0.25 mg inject once weekly - Appropriate, Query effective, Safe, Accessible -Medications previously tried: Ozempic (cost), glimepiride (not needed)  -Current home glucose readings fasting glucose: could not provide post prandial glucose: n/a (before dinner)  -Denies hypoglycemic/hyperglycemic  symptoms -Current meal patterns:  3 meals a day breakfast: scrambled eggs and toast and sometimes bacon, coffee  lunch: salad, half of sandwich  dinner:  small piece of meat; more vegetarian; roll, vegetables snacks: backed down on snacking before bed drinks: unsweet tea, water some -Current exercise: back pain limits her, walks in neighborhood some with neighbor a couple days a week; signed up for the [facility] - thinking about swimming classes -Educated on A1c and blood sugar goals; Complications of diabetes including kidney damage, retinal damage, and cardiovascular disease; Benefits of routine self-monitoring of blood sugar; Continuous glucose monitoring; Carbohydrate counting and/or plate method -Counseled to check feet daily and get yearly eye exams -Counseled on diet and exercise extensively Recommended increasing Ozempic to 0.5 mg weekly.  Pain (Goal: minimize pain) -Not ideally controlled -Current treatment  Gabapentin 100 mg 1 capsule every 8 hours as needed - Appropriate, Query effective, Safe, Accessible Hydrocodone-APAP 5-325 mg 1 tablet every 6 hours as needed - Appropriate, Query effective, Query Safe, Accessible -Medications previously tried: n/a  -Recommended to continue current medication  GERD (Goal: minimize symptoms) -Controlled -Current treatment  Omeprazole 20 mg 1 capsule daily - Appropriate, Effective, Query Safe, Accessible -Medications previously tried: none  - Reassess the need at follow up.  Health Maintenance -Vaccine gaps: shingrix, COVID booster -Current therapy:  Aspirin 81 mg 1 tablet daily -Educated on Herbal supplement research is limited and benefits usually cannot be proven -Patient is satisfied with current therapy and denies issues - Reassess the need for aspirin at follow up.  Patient Goals/Self-Care Activities Patient will:  - check glucose daily, document, and provide at future appointments check blood pressure 1-2 times a week,  document, and provide at future appointments collaborate with provider on medication access solutions target a minimum of 150 minutes of moderate intensity exercise weekly  Follow Up Plan: The care management team will reach out to the patient again over the next 30 days.         Medication Assistance:  Ozempic, Toujeo and Jardiance obtained through [employer], [employer] and [employer] medication assistance program.  Enrollment ends 11/21/22  Compliance/Adherence/Medication fill history: Care Gaps: Eye exam, COVID booster, foot exam, shingrix, DEXA, colonoscopy, Hep C screening, urine microalbumin Last BP: 136/66 (10/11/22) Last A1c: 8.0% (10/11/22)  Star-Rating Drugs: Rosuvastatin (Crestor) 20 mg - Last filled 10/08/22 90 DS at [facility] Metformin (Glucophage) 500 mg - Last filled 08/03/22 90 DS at [facility]  Jardiance 10 mg - PAP  Patient's preferred pharmacy is:  [place], [place] - [address] [place] [address] Phone: [phone] Fax: [phone]  [id number] [place] [id number]: [phone]: [phone] Uses pill box? Yes Pt endorses 99% compliance  We discussed: Current pharmacy is preferred with insurance plan and patient is satisfied with pharmacy services Patient decided to: Continue current medication management strategy  Care Plan and Follow Up Patient Decision:  Patient agrees to Care Plan and Follow-up.  Plan: The care management team will reach out to the patient again over the next 30 days.  MJeni Salles PharmD, [place]armacist [place]

## 2022-11-08 ENCOUNTER — Ambulatory Visit (INDEPENDENT_AMBULATORY_CARE_PROVIDER_SITE_OTHER): Payer: Medicare HMO | Admitting: Pharmacist

## 2022-11-08 ENCOUNTER — Other Ambulatory Visit: Payer: Self-pay

## 2022-11-08 DIAGNOSIS — I10 Essential (primary) hypertension: Secondary | ICD-10-CM

## 2022-11-08 DIAGNOSIS — E1140 Type 2 diabetes mellitus with diabetic neuropathy, unspecified: Secondary | ICD-10-CM

## 2022-11-08 MED ORDER — EMPAGLIFLOZIN 10 MG PO TABS: 10.0000 mg | ORAL_TABLET | Freq: Every day | ORAL | 0 refills | Status: DC

## 2022-11-11 ENCOUNTER — Telehealth: Payer: Self-pay | Admitting: *Deleted

## 2022-11-11 NOTE — Telephone Encounter (Signed)
I left a detailed message at the patient's cell number stating Toujeo from the patient assistance program is ready for her to pick up.  Packing slip sent to be scanned.

## 2022-11-12 NOTE — Patient Instructions (Signed)
Hi Jaysha,  It was great to catch up again! Please keep a log of your blood sugar readings at home.  Please reach out to me if you have any questions or need anything before our follow up!  Best, Maddie  Jeni Salles, PharmD, [place] Pharmacist [place] [facility] [place]

## 2022-11-21 DIAGNOSIS — I10 Essential (primary) hypertension: Secondary | ICD-10-CM

## 2022-11-21 DIAGNOSIS — E1140 Type 2 diabetes mellitus with diabetic neuropathy, unspecified: Secondary | ICD-10-CM

## 2022-12-06 ENCOUNTER — Telehealth: Payer: Self-pay | Admitting: Family Medicine

## 2022-12-06 MED ORDER — HYDROCODONE-ACETAMINOPHEN 5-325 MG PO TABS: 1.0000 | ORAL_TABLET | Freq: Four times a day (QID) | ORAL | 0 refills | Status: DC | PRN

## 2022-12-06 NOTE — Telephone Encounter (Signed)
Patient stopped by because she needs refill on HYDROcodone-acetaminophen (NORCO/VICODIN) 5-325 MG tablet       Please send to  [place], [place] Phone: [phone]  Fax: [phone]        Please advise

## 2022-12-06 NOTE — Telephone Encounter (Signed)
I sent in 1 refill of the Vicodin  [name] MD [facility]

## 2022-12-06 NOTE — Telephone Encounter (Signed)
Noted  

## 2022-12-21 ENCOUNTER — Encounter: Payer: Self-pay | Admitting: Family Medicine

## 2022-12-21 ENCOUNTER — Ambulatory Visit (INDEPENDENT_AMBULATORY_CARE_PROVIDER_SITE_OTHER): Payer: Medicare HMO | Admitting: Family Medicine

## 2022-12-21 VITALS — BP 130/64 | HR 95 | Temp 98.9°F | Ht 60.0 in | Wt 159.6 lb

## 2022-12-21 DIAGNOSIS — M545 Low back pain, unspecified: Secondary | ICD-10-CM | POA: Diagnosis not present

## 2022-12-21 DIAGNOSIS — G8929 Other chronic pain: Secondary | ICD-10-CM | POA: Diagnosis not present

## 2022-12-21 DIAGNOSIS — H6121 Impacted cerumen, right ear: Secondary | ICD-10-CM | POA: Diagnosis not present

## 2022-12-21 DIAGNOSIS — R42 Dizziness and giddiness: Secondary | ICD-10-CM | POA: Diagnosis not present

## 2022-12-21 DIAGNOSIS — E1140 Type 2 diabetes mellitus with diabetic neuropathy, unspecified: Secondary | ICD-10-CM

## 2022-12-21 MED ORDER — HYDROCODONE-ACETAMINOPHEN 5-325 MG PO TABS: 1.0000 | ORAL_TABLET | Freq: Four times a day (QID) | ORAL | 0 refills | Status: DC | PRN

## 2022-12-21 NOTE — Progress Notes (Signed)
Established Patient Office Visit  Subjective   Patient ID: Mary Caldwell, female    DOB: Jan 03, 1946  Age: 77 y.o. MRN: [id number]  Chief Complaint  Patient presents with   Ear Pain    Patient complains of right ear pain, x1 year   Dizziness    Patient complains of dizziness. X2 weeks,    Back Pain    Patient complains of lower back pain, Tried Aleeve with little relief     HPI   Ms. Zafar has chronic problems including history of obesity, hypertension, type 2 diabetes with neuropathy, hyperlipidemia.  Regarding her diabetes she is now on Ozempic and has lost another 7 pounds since last visit.  She is tolerating well at 0.5 mg subcutaneous once weekly.  She states her top adult weight was about 260 pounds.  She also remains on Jardiance, Toujeo insulin, metformin-in addition to the Ozempic.  Is complaining of some right ear pain she states for almost a year.  She has fullness in the ear.  No ear drainage.  Also complains of some vague dizziness for the past couple weeks.  No vertigo.  No syncope.  No palpitations.  She is very vague with regard to describing her dizziness.  No clear triggers.  Not clearly orthostatic.  Chronic back pain.  She has severe progressive lumbar spondylosis.  She has had back injections from specialist and is scheduled for repeat in a couple weeks.  She is requesting refill of Vicodin when which she tries to take sporadically and infrequently for severe pain.  Past Medical History:  Diagnosis Date   ABDOMINAL ABSCESS 12/29/2009   ASTHMA 02/11/2009   DIABETES-TYPE 2 02/11/2009   ESSENTIAL HYPERTENSION 02/11/2009   HYPERLIPIDEMIA 02/11/2009   OBESITY 02/11/2009   Past Surgical History:  Procedure Laterality Date   [place]   steel rod left leg, now removed    reports that she has never smoked. She has never used smokeless tobacco. She reports current alcohol use. She reports that she does not use  drugs. family history includes Arthritis in an other family member; Colon cancer in her sister; Diabetes in an other family member; Heart disease in an other family member. Allergies  Allergen Reactions   Atorvastatin     Does not remember   Codeine Sulfate Nausea Only    GI upset    Review of Systems  Constitutional:  Negative for chills and fever.  Respiratory:  Negative for cough.   Cardiovascular:  Negative for chest pain.  Genitourinary:  Negative for dysuria.  Musculoskeletal:  Positive for back pain.  Neurological:  Positive for dizziness. Negative for speech change, focal weakness, seizures, loss of consciousness and headaches.  Psychiatric/Behavioral:  Negative for depression.       Objective:     BP 130/64 (BP Location: Left Arm, Patient Position: Sitting, Cuff Size: Normal)   Pulse 95   Temp 98.9 F (37.2 C) (Oral)   Ht 5' (1.524 m)   Wt 159 lb 9.6 oz (72.4 kg)   SpO2 98%   BMI 31.17 kg/m  BP Readings from Last 3 Encounters:  12/21/22 130/64  10/11/22 136/66  08/11/22 (!) 140/70   Wt Readings from Last 3 Encounters:  12/21/22 159 lb 9.6 oz (72.4 kg)  10/11/22 166 lb 8 oz (75.5 kg)  08/11/22 167 lb 6.4 oz (75.9 kg)      Physical Exam Vitals reviewed.  Constitutional:  Appearance: She is obese.  HENT:     Ears:     Comments: She has cerumen impaction right ear canal.  This was removed and patient tolerated well and eardrum shows no acute changes Cardiovascular:     Rate and Rhythm: Normal rate and regular rhythm.  Pulmonary:     Effort: Pulmonary effort is normal.     Breath sounds: Normal breath sounds.  Musculoskeletal:     Right lower leg: No edema.     Left lower leg: No edema.  Neurological:     Mental Status: She is alert.      No results found for any visits on 12/21/22.  Last CBC Lab Results  Component Value Date   WBC 9.0 09/29/2021   HGB 12.8 09/29/2021   HCT 37.2 09/29/2021   MCV 90.5 09/29/2021   MCH 31.1 09/29/2021    RDW 12.6 09/29/2021   PLT 236 44/96/7591   Last metabolic panel Lab Results  Component Value Date   GLUCOSE 132 (H) 02/15/2022   NA 130 (L) 02/15/2022   K 3.9 02/15/2022   CL 94 (L) 02/15/2022   CO2 29 02/15/2022   BUN 14 02/15/2022   CREATININE 0.85 02/15/2022   GFRNONAA >60 09/29/2021   CALCIUM 9.5 02/15/2022   PROT 7.0 02/15/2022   ALBUMIN 4.4 02/15/2022   BILITOT 0.6 02/15/2022   ALKPHOS 24 (L) 02/15/2022   AST 25 02/15/2022   ALT 15 02/15/2022   ANIONGAP 7 09/29/2021   Last lipids Lab Results  Component Value Date   CHOL 158 02/15/2022   HDL 39.50 02/15/2022   LDLDIRECT 70.0 02/15/2022   TRIG 397.0 (H) 02/15/2022   CHOLHDL 4 02/15/2022   Last hemoglobin A1c Lab Results  Component Value Date   HGBA1C 8.0 (A) 10/11/2022   Last thyroid functions Lab Results  Component Value Date   TSH 1.71 12/01/2020      The 10-year ASCVD risk score (Arnett DK, et al., 2019) is: 31.5%    Assessment & Plan:   #1 type 2 diabetes.  History of poor control.  Her last A1c was 8 and this was prior to starting Ozempic.  She is currently tolerating 0.5 mg and she has scheduled follow-up next month to reassess.  Hopefully her A1c will be much improved at that time.  #2 right ear pain.  She had large amount of cerumen in the canal.  We discussed risk of extraction with curette including risk of pain and low risk of bleeding.  Patient consented.  We were able to remove a very large plug of cerumen with curette and she tolerated well.  Ear canal is now clear  #3 chronic low back pain with history of lumbar spondylosis.  She had some injections with temporary improvement.  We agreed to refill her Vicodin 5/325 mg #20.  She is aware of limited number we can give for non- chronic pain management  #4 dizziness and lightheadedness.  She states the symptoms are very inconsistent.  Not describe any clear vertigo.  Stay well-hydrated.  Change positions slowly.  Carolann Littler, MD

## 2022-12-30 DIAGNOSIS — M48062 Spinal stenosis, lumbar region with neurogenic claudication: Secondary | ICD-10-CM | POA: Diagnosis not present

## 2022-12-30 DIAGNOSIS — M4316 Spondylolisthesis, lumbar region: Secondary | ICD-10-CM | POA: Diagnosis not present

## 2023-01-11 ENCOUNTER — Ambulatory Visit (INDEPENDENT_AMBULATORY_CARE_PROVIDER_SITE_OTHER): Payer: Medicare HMO | Admitting: Family Medicine

## 2023-01-11 VITALS — BP 122/62 | HR 83 | Temp 98.9°F | Ht 60.0 in | Wt 164.7 lb

## 2023-01-11 DIAGNOSIS — E785 Hyperlipidemia, unspecified: Secondary | ICD-10-CM

## 2023-01-11 DIAGNOSIS — E1140 Type 2 diabetes mellitus with diabetic neuropathy, unspecified: Secondary | ICD-10-CM

## 2023-01-11 DIAGNOSIS — I10 Essential (primary) hypertension: Secondary | ICD-10-CM

## 2023-01-11 LAB — COMPREHENSIVE METABOLIC PANEL
ALT: 13 U/L (ref 0–35)
AST: 17 U/L (ref 0–37)
Albumin: 4.1 g/dL (ref 3.5–5.2)
Alkaline Phosphatase: 31 U/L — ABNORMAL LOW (ref 39–117)
BUN: 19 mg/dL (ref 6–23)
CO2: 27 mEq/L (ref 19–32)
Calcium: 9.7 mg/dL (ref 8.4–10.5)
Chloride: 101 mEq/L (ref 96–112)
Creatinine, Ser: 0.83 mg/dL (ref 0.40–1.20)
GFR: 68.38 mL/min (ref >60.00)
Glucose, Bld: 156 mg/dL — ABNORMAL HIGH (ref 70–99)
Potassium: 4.1 mEq/L (ref 3.5–5.1)
Sodium: 138 mEq/L (ref 135–145)
Total Bilirubin: 0.4 mg/dL (ref 0.2–1.2)
Total Protein: 7 g/dL (ref 6.0–8.3)

## 2023-01-11 LAB — POCT GLYCOSYLATED HEMOGLOBIN (HGB A1C): Hemoglobin A1C: 7.2 % — AB (ref 4.0–5.6)

## 2023-01-11 LAB — LDL CHOLESTEROL, DIRECT: Direct LDL: 47 mg/dL

## 2023-01-11 LAB — LIPID PANEL
Cholesterol: 121 mg/dL (ref 0–200)
HDL: 32 mg/dL — ABNORMAL LOW (ref >39.00)
NonHDL: 89.2
Total CHOL/HDL Ratio: 4
Triglycerides: 389 mg/dL — ABNORMAL HIGH (ref 0.0–149.0)
VLDL: 77.8 mg/dL — ABNORMAL HIGH (ref 0.0–40.0)

## 2023-01-11 NOTE — Progress Notes (Signed)
Established Patient Office Visit  Subjective   Patient ID: Mary Caldwell, female    DOB: 1946-11-12  Age: 77 y.o. MRN: [id number]  Chief Complaint  Patient presents with   Medical Management of Chronic Issues    HPI   Mary Caldwell has history of obesity, type 2 diabetes, hypertension, hyperlipidemia.  She has history of severe hypertriglyceridemia and is currently managed with fenofibrate and rosuvastatin.  For diabetes she takes Toujeo 40 units daily, Jardiance, metformin, and Ozempic 0.5 mg subcutaneous once weekly.  Her weight has continued to come down slightly.  Last A1c was 8.0.  She is not monitoring her blood sugars regularly.  No recent hypoglycemic symptoms.  Denies any myalgias or other side effects from her rosuvastatin or fenofibrate.  She is due for follow-up labs.  She had cataract surgery in the past year and vision has improved.  Her cataract surgery went well.  She also recently got new glasses  Denies any recent falls.  No chest pains.  Past Medical History:  Diagnosis Date   ABDOMINAL ABSCESS 12/29/2009   ASTHMA 02/11/2009   DIABETES-TYPE 2 02/11/2009   ESSENTIAL HYPERTENSION 02/11/2009   HYPERLIPIDEMIA 02/11/2009   OBESITY 02/11/2009   Past Surgical History:  Procedure Laterality Date   [place]   steel rod left leg, now removed    reports that she has never smoked. She has never used smokeless tobacco. She reports current alcohol use. She reports that she does not use drugs. family history includes Arthritis in an other family member; Colon cancer in her sister; Diabetes in an other family member; Heart disease in an other family member. Allergies  Allergen Reactions   Atorvastatin     Does not remember   Codeine Sulfate Nausea Only    GI upset    Review of Systems  Constitutional:  Negative for malaise/fatigue.  Eyes:  Negative for blurred vision.  Respiratory:  Negative for shortness of breath.    Cardiovascular:  Negative for chest pain.  Neurological:  Negative for dizziness, weakness and headaches.      Objective:     BP 122/62 (BP Location: Left Arm, Patient Position: Sitting, Cuff Size: Normal)   Pulse 83   Temp 98.9 F (37.2 C) (Oral)   Ht 5' (1.524 m)   Wt 164 lb 11.2 oz (74.7 kg)   SpO2 96%   BMI 32.17 kg/m  BP Readings from Last 3 Encounters:  01/11/23 122/62  12/21/22 130/64  10/11/22 136/66   Wt Readings from Last 3 Encounters:  01/11/23 164 lb 11.2 oz (74.7 kg)  12/21/22 159 lb 9.6 oz (72.4 kg)  10/11/22 166 lb 8 oz (75.5 kg)      Physical Exam Vitals reviewed.  Constitutional:      Appearance: She is well-developed.  Eyes:     Pupils: Pupils are equal, round, and reactive to light.  Neck:     Thyroid: No thyromegaly.     Vascular: No JVD.  Cardiovascular:     Rate and Rhythm: Normal rate and regular rhythm.     Heart sounds:     No gallop.  Pulmonary:     Effort: Pulmonary effort is normal. No respiratory distress.     Breath sounds: Normal breath sounds. No wheezing or rales.  Musculoskeletal:     Cervical back: Neck supple.     Right lower leg: No edema.     Left lower  leg: No edema.  Neurological:     Mental Status: She is alert.      Results for orders placed or performed in visit on 01/11/23  POC HgB A1c  Result Value Ref Range   Hemoglobin A1C 7.2 (A) 4.0 - 5.6 %   HbA1c POC (<> result, manual entry)     HbA1c, POC (prediabetic range)     HbA1c, POC (controlled diabetic range)        The 10-year ASCVD risk score (Arnett DK, et al., 2019) is: 28.4%    Assessment & Plan:   #1 type 2 diabetes.  Improved with A1c 7.2%.  Continue combination therapy with metformin, Jardiance, Ozempic, and [place].  We did discuss option that we could go up further with her Ozempic to 1 mg but she would like to keep current dosage.  We feel like 7.2% is reasonable control for her age.  Hopefully she will be able to continue with her weight  loss efforts.  Recheck in 4 months  #2 dyslipidemia.  History of severe hypertriglyceridemia.  Patient on fenofibrate and rosuvastatin.  Check lipid and CMP  #3 hypertension history.  Currently stable off blood pressure medications.  This has improved with her weight loss.  Continue to monitor.   Return in about 4 months (around 05/12/2023).    Carolann Littler, MD

## 2023-01-12 ENCOUNTER — Other Ambulatory Visit: Payer: Self-pay | Admitting: Family Medicine

## 2023-01-20 ENCOUNTER — Other Ambulatory Visit: Payer: Self-pay | Admitting: Family Medicine

## 2023-02-10 DIAGNOSIS — M48062 Spinal stenosis, lumbar region with neurogenic claudication: Secondary | ICD-10-CM | POA: Diagnosis not present

## 2023-02-16 ENCOUNTER — Telehealth: Payer: Self-pay | Admitting: Family Medicine

## 2023-02-16 ENCOUNTER — Telehealth: Payer: Self-pay

## 2023-02-16 NOTE — Progress Notes (Signed)
  Call to Check shipping status of Approved Patient Assistance Applications per Theo Dills as follows:  [employer] for [employer]: [employer] that patient is not due for next shipment until 02/09/23 and this medication is scheduled for auto fill.  [employer] for Jardiance: Representative states this medication must be ordered each time as they do not offer auto fill, Ordered for her was advised it will be to her home within 10 business days.  [employer] for [employer]: [employer] that this medication was shipped to [facility] office on 01/28/23 and was signed for by M. Miller. Confirmed with Theo Dills that medication is no longer at the office.  Call to the patient to advise all of the above and gave her the phone number to [facility] cares [phone])  so that she may reorder the next time also inquired about receipt of her Toujeo.  Did not reach her left a detailed msg with my contact information for return call    [place] Pharmacist Assistant [phone]

## 2023-02-16 NOTE — Telephone Encounter (Signed)
Contacted [name] to schedule their annual wellness visit. Appointment made for 03/03/23.  [employer] [facility] direct phone # [phone]   Moved [facility] appt 4/11 to [employer] schedule

## 2023-02-24 ENCOUNTER — Other Ambulatory Visit: Payer: Self-pay | Admitting: Family Medicine

## 2023-02-25 NOTE — Telephone Encounter (Signed)
Pt calling checking on progress of refill. Says she is out

## 2023-03-03 ENCOUNTER — Encounter: Payer: Self-pay | Admitting: Family Medicine

## 2023-03-03 ENCOUNTER — Ambulatory Visit (INDEPENDENT_AMBULATORY_CARE_PROVIDER_SITE_OTHER): Payer: Medicare HMO | Admitting: Family Medicine

## 2023-03-03 VITALS — Wt 158.0 lb

## 2023-03-03 DIAGNOSIS — Z0000 Encounter for general adult medical examination without abnormal findings: Secondary | ICD-10-CM
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
## 2023-03-03 NOTE — Patient Instructions (Signed)
I really enjoyed getting to talk with you today! I am available on Tuesdays and Thursdays for virtual visits if you have any questions or concerns, or if I can be of any further assistance.   CHECKLIST FROM ANNUAL WELLNESS VISIT:  -Follow up (please call to schedule if not scheduled after visit):   -yearly for annual wellness visit with primary care office  Here is a list of your preventive care/health maintenance measures and the plan for each if any are due:  PLAN For any measures below that may be due:  -if you change your mind about any of theses - please let us know and we can assist.   Health Maintenance  Topic Date Due   COVID-19 Vaccine (3 - Pfizer risk series) 03/19/2023 (Originally 09/22/2020)   Zoster Vaccines- Shingrix (1 of 2) 06/02/2023 (Originally 05/30/1965)   DEXA SCAN  03/02/2024 (Originally 05/31/2011)   COLONOSCOPY (Pts 45-9yrs Insurance coverage will need to be confirmed)  03/02/2024 (Originally 03/23/2021)   Hepatitis C Screening  03/02/2024 (Originally 05/30/1964)   INFLUENZA VACCINE  06/23/2023   HEMOGLOBIN A1C  07/12/2023   OPHTHALMOLOGY EXAM  07/28/2023   Diabetic kidney evaluation - Urine ACR  10/12/2023   FOOT EXAM  10/12/2023   Diabetic kidney evaluation - eGFR measurement  01/12/2024   Medicare Annual Wellness (AWV)  03/02/2024   DTaP/Tdap/Td (3 - Td or Tdap) 03/21/2031   Pneumonia Vaccine 39+ Years old  Completed   HPV VACCINES  Aged Out    -See a dentist at least yearly  -Get your eyes checked and then per your eye specialist's recommendations  -Other issues addressed today:   -I have included below further information regarding a healthy whole foods based diet, physical activity guidelines for adults, stress management and opportunities for social connections. I hope you find this information useful.    -----------------------------------------------------------------------------------------------------------------------------------------------------------------------------------------------------------------------------------------------------------  NUTRITION: -eat real food: lots of colorful vegetables (half the plate) and fruits -5-7 servings of vegetables and fruits per day (fresh or steamed is best), exp. 2 servings of vegetables with lunch and dinner and 2 servings of fruit per day. Berries and greens such as kale and collards are great choices.  -consume on a regular basis: whole grains (make sure first ingredient on label contains the word "whole"), fresh fruits, fish, nuts, seeds, healthy oils (such as olive oil, avocado oil, grape seed oil) -may eat small amounts of dairy and lean meat on occasion, but avoid processed meats such as ham, bacon, lunch meat, etc. -drink water -try to avoid fast food and pre-packaged foods, processed meat -most experts advise limiting sodium to < 2300mg  per day, should limit further is any chronic conditions such as high blood pressure, heart disease, diabetes, etc. The American Heart Association advised that < 1500mg  is is ideal -try to avoid foods that contain any ingredients with names you do not recognize  -try to avoid sugar/sweets (except for the natural sugar that occurs in fresh fruit) -try to avoid sweet drinks -try to avoid white rice, white bread, pasta (unless whole grain), white or yellow potatoes  EXERCISE GUIDELINES FOR ADULTS: -if you wish to increase your physical activity, do so gradually and with the approval of your doctor -STOP and seek medical care immediately if you have any chest pain, chest discomfort or trouble breathing when starting or increasing exercise  -move and stretch your body, legs, feet and arms when sitting for long periods -Physical activity guidelines for optimal health in adults: -least 150 minutes per week  of  aerobic exercise (can talk, but not sing) once approved by your doctor, 20-30 minutes of sustained activity or two 10 minute episodes of sustained activity every day.  -resistance training at least 2 days per week if approved by your doctor -balance exercises 3+ days per week:   Stand somewhere where you have something sturdy to hold onto if you lose balance.    1) lift up on toes, start with 5x per day and work up to 20x   2) stand and lift on leg straight out to the side so that foot is a few inches of the floor, start with 5x each side and work up to 20x each side   3) stand on one foot, start with 5 seconds each side and work up to 20 seconds on each side  If you need ideas or help with getting more active:  -Silver sneakers https://tools.silversneakers.com  -Walk with a Doc: http://stephens-thompson.biz/  -try to include resistance (weight lifting/strength building) and balance exercises twice per week: or the following link for ideas: ChessContest.fr  UpdateClothing.com.cy  STRESS MANAGEMENT: -can try meditating, or just sitting quietly with deep breathing while intentionally relaxing all parts of your body for 5 minutes daily -if you need further help with stress, anxiety or depression please follow up with your primary doctor or contact the wonderful folks at [facility]: [place]: -options in [place] if you wish to engage in more social and exercise related activities:  -Silver sneakers https://tools.silversneakers.com  -Walk with a Doc: http://stephens-thompson.biz/  -Check out the [place] 50+ section on the [place] of [employer] (hiking clubs, book clubs, cards and games, chess, exercise classes, aquatic classes and much more) - see the website for  details: https://www.[place].gov/departments/parks-recreation/active-adults50  -YouTube has lots of exercise videos for different ages and abilities as well  -[place] (a variety of indoor and outdoor inperson activities for adults). 417-068-6191. [address]  -Virtual Online Classes (a variety of topics): see seniorplanet.org or call 941-113-2322  -consider volunteering at a school, hospice center, church, senior center or elsewhere

## 2023-03-18 ENCOUNTER — Ambulatory Visit (INDEPENDENT_AMBULATORY_CARE_PROVIDER_SITE_OTHER): Payer: Medicare HMO | Admitting: Adult Health

## 2023-03-18 ENCOUNTER — Encounter: Payer: Self-pay | Admitting: Adult Health

## 2023-03-18 VITALS — BP 138/62 | HR 95 | Temp 98.9°F | Ht 60.0 in | Wt 161.0 lb

## 2023-03-18 DIAGNOSIS — R300 Dysuria: Secondary | ICD-10-CM

## 2023-03-18 LAB — POCT URINALYSIS DIPSTICK
Bilirubin, UA: NEGATIVE
Blood, UA: NEGATIVE
Glucose, UA: POSITIVE — AB
Ketones, UA: NEGATIVE
Nitrite, UA: NEGATIVE
Protein, UA: NEGATIVE
Spec Grav, UA: 1.02 (ref 1.010–1.025)
Urobilinogen, UA: 0.2 E.U./dL
pH, UA: 5 (ref 5.0–8.0)

## 2023-03-18 MED ORDER — CEPHALEXIN 500 MG PO CAPS: 500.0000 mg | ORAL_CAPSULE | Freq: Two times a day (BID) | ORAL | 0 refills | Status: AC
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
## 2023-03-20 LAB — URINE CULTURE
MICRO NUMBER:: 14879820
SPECIMEN QUALITY:: ADEQUATE

## 2023-04-27 ENCOUNTER — Other Ambulatory Visit: Payer: Self-pay | Admitting: Family Medicine

## 2023-04-27 MED ORDER — HYDROCODONE-ACETAMINOPHEN 5-325 MG PO TABS: 1.0000 | ORAL_TABLET | Freq: Four times a day (QID) | ORAL | 0 refills | Status: DC | PRN

## 2023-04-27 NOTE — Telephone Encounter (Signed)
Prescription Request  04/27/2023  LOV: 01/11/2023  What is the name of the medication or equipment? Hydrocodone-acetaminophen.  Have you contacted your pharmacy to request a refill? No   Which pharmacy would you like this sent to? [facility] [address], [place] - [address] [place] [address] Phone: [phone] Fax: [phone]   Patient notified that their request is being sent to the clinical staff for review and that they should receive a response within 2 business days.   Please advise at Mobile [phone] (mobile)

## 2023-05-02 DIAGNOSIS — M48062 Spinal stenosis, lumbar region with neurogenic claudication: Secondary | ICD-10-CM | POA: Diagnosis not present

## 2023-05-09 ENCOUNTER — Other Ambulatory Visit: Payer: Self-pay | Admitting: Family Medicine

## 2023-06-02 DIAGNOSIS — M48062 Spinal stenosis, lumbar region with neurogenic claudication: Secondary | ICD-10-CM | POA: Diagnosis not present

## 2023-06-02 DIAGNOSIS — Z6831 Body mass index (BMI) 31.0-31.9, adult: Secondary | ICD-10-CM | POA: Diagnosis not present

## 2023-06-03 ENCOUNTER — Other Ambulatory Visit: Payer: Self-pay | Admitting: Family Medicine

## 2023-06-10 ENCOUNTER — Telehealth: Payer: Self-pay | Admitting: Family Medicine

## 2023-06-10 NOTE — Telephone Encounter (Signed)
Prescription Request  06/10/2023  LOV: 01/11/2023  What is the name of the medication or equipment?   empagliflozin (JARDIANCE) 10 MG TABS tablet  insulin glargine, 2 Unit Dial, (TOUJEO MAX SOLOSTAR) 300 UNIT/ML Solostar Pen    Have you contacted your pharmacy to request a refill? No   Which pharmacy would you like this sent to?   Pt states someone calls her and she comes into this office to pick these up.   Patient notified that their request is being sent to the clinical staff for review and that they should receive a response within 2 business days.   Please advise at Mobile [phone] (mobile)

## 2023-06-13 ENCOUNTER — Telehealth: Payer: Self-pay

## 2023-06-13 NOTE — Progress Notes (Unsigned)
   06/13/2023  Patient ID: Mary Caldwell, female   DOB: 12/24/1945, 77 y.o.   MRN: [id number]  Returning patient call about Jardiance 10mg   and Toujeo (40 units dailly) PAP supplied refills  -Mary Caldwell is out of [place] and low on Toujeo -Contacted [employer] to request Jardiance and [employer] to request Toujeo refills on patient's behalf -Nathen May is needing an refill, so [employer] is faxing a reorder form to Dr. Roswell Miners office -Mary Caldwell is not on an auto-fill, so patient does need to request refills when running low.  Program is processing refill, and this should arrive in 5-10 business days. -Coordinating with mediation assistance team to add patient to PAP spreadsheet, so we can assist with program re-enrollment when due -Contacted Mary Caldwell to inform her and make sure she has my contact information- told her to call if she has not rec'd medications in the next week and a half  Lenna Gilford, PharmD, DPLA

## 2023-06-13 NOTE — Telephone Encounter (Signed)
Noted  

## 2023-06-16 ENCOUNTER — Telehealth: Payer: Self-pay | Admitting: Family Medicine

## 2023-06-16 NOTE — Telephone Encounter (Signed)
Prescription Request  06/16/2023  LOV: 01/11/2023  What is the name of the medication or equipment? HYDROcodone-acetaminophen HYDROcodone-acetaminophen (NORCO/VICODIN) 5-325 MG tablet  Pt states she is completely out and in a lot of pain. Pt informed MD is OOO. Pt is asking if there is any way another MD could please assist? Pt states she is having quite a bit of back pain. Please advise.   Have you contacted your pharmacy to request a refill? Yes  Which pharmacy would you like this sent to?   [facility] [address] [place], [place] - [address] [place] [address] Phone: [phone] Fax: [phone]    Patient notified that their request is being sent to the clinical staff for review and that they should receive a response within 2 business days.   Please advise at Mobile [phone] (mobile)

## 2023-06-17 MED ORDER — HYDROCODONE-ACETAMINOPHEN 5-325 MG PO TABS: 1.0000 | ORAL_TABLET | Freq: Four times a day (QID) | ORAL | 0 refills | Status: DC | PRN

## 2023-06-17 NOTE — Telephone Encounter (Signed)
I sent in one refill of Vicodin to [facility].   Kristian Covey MD [employer] [facility] at [facility]

## 2023-06-17 NOTE — Telephone Encounter (Signed)
Noted  

## 2023-06-20 DIAGNOSIS — M48062 Spinal stenosis, lumbar region with neurogenic claudication: Secondary | ICD-10-CM | POA: Diagnosis not present

## 2023-06-20 DIAGNOSIS — M533 Sacrococcygeal disorders, not elsewhere classified: Secondary | ICD-10-CM | POA: Diagnosis not present

## 2023-06-22 NOTE — Telephone Encounter (Signed)
Received REODER/REFILL REQUEST FORM FROM [employer] TOUJEO AND FAX TO PROVIDER OFFICE.    PLEASE BE ADVISED

## 2023-06-22 NOTE — Telephone Encounter (Signed)
Noted  

## 2023-06-29 ENCOUNTER — Ambulatory Visit (INDEPENDENT_AMBULATORY_CARE_PROVIDER_SITE_OTHER): Payer: Medicare HMO | Admitting: Family Medicine

## 2023-06-29 VITALS — BP 130/80 | HR 64 | Temp 99.2°F | Wt 162.7 lb

## 2023-06-29 DIAGNOSIS — H9201 Otalgia, right ear: Secondary | ICD-10-CM | POA: Diagnosis not present

## 2023-06-29 MED ORDER — CORTISPORIN-TC 3.3-3-10-0.5 MG/ML OT SUSP: 3.0000 [drp] | Freq: Four times a day (QID) | OTIC | 0 refills | Status: DC

## 2023-06-29 NOTE — Patient Instructions (Signed)
Use the medicated drops in ear four times daily  Try to keep water out of ear as much as possible  Set up 2 month follow up.
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
## 2023-06-30 ENCOUNTER — Telehealth: Payer: Self-pay

## 2023-06-30 NOTE — Progress Notes (Signed)
   06/30/2023  Patient ID: [name], female   DOB: 02/14/1946, 77 y.o.   MRN: [id number]  Patient outreach to check in on medications obtained through patient assistance programs.  -Ms. Lute has  rec'd Jardiance 10mg  from [facility] cares -She has not received any Toujeo from [employer] yet, but she states she has plenty on hand [employer], and they have still not received an order from Dr. Lucie Leather office  Following up with PCP to check on status of completing form for Toujeo PAP.  Lenna Gilford, PharmD, DPLA

## 2023-07-01 NOTE — Telephone Encounter (Signed)
Mary Caldwell, RPH reached out to F/U. RPH was informed MD's CMA is OOO today, and MD tries his best to read messages in between seeing Pts. RPH verbalized understanding. RPH will F/U again, next week.

## 2023-07-05 ENCOUNTER — Telehealth: Payer: Self-pay

## 2023-07-05 NOTE — Telephone Encounter (Signed)
Forms were received and placed in PCP's folder for review.

## 2023-07-05 NOTE — Telephone Encounter (Signed)
-----   Message from Lenna Gilford sent at 06/29/2023  7:01 PM EDT ----- Did the office ever receive/complete the form from [employer] to refill the patient's Toujeo she gets from the patient assistance program?

## 2023-07-05 NOTE — Telephone Encounter (Signed)
Forms have not been received. Message sent to Sabino Niemann to have forms faxed to our office.

## 2023-07-06 NOTE — Telephone Encounter (Signed)
PAP: Application for Toujeo has been submitted to PAP Companies: [employer], via fax FOR PROCESSING!  PLEASE BE ADVISED

## 2023-07-11 DIAGNOSIS — M48062 Spinal stenosis, lumbar region with neurogenic claudication: Secondary | ICD-10-CM | POA: Diagnosis not present

## 2023-07-11 DIAGNOSIS — Z6831 Body mass index (BMI) 31.0-31.9, adult: Secondary | ICD-10-CM | POA: Diagnosis not present

## 2023-07-14 ENCOUNTER — Telehealth: Payer: Self-pay

## 2023-07-14 NOTE — Progress Notes (Signed)
   07/14/2023  Patient ID: Mary Caldwell, female   DOB: [birth year], 77 y.o.   MRN: [id number]  Telephone follow-up to check on status of Toujeo provided by [employer] PAP.  -[employer], and patient is approved for Toujeo PAP; and the program now has everything needed to process the prescription for the patient -This should arrive in 1-2 weeks -Patient states she has enough Toujeo on hand to last until then -Provided my direct number for her to call if this is not received by the 1st week of September  Lenna Gilford, PharmD, DPLA

## 2023-08-23 ENCOUNTER — Other Ambulatory Visit: Payer: Self-pay | Admitting: Family Medicine

## 2023-08-24 NOTE — Telephone Encounter (Signed)
2

## 2023-09-21 DIAGNOSIS — M48062 Spinal stenosis, lumbar region with neurogenic claudication: Secondary | ICD-10-CM | POA: Diagnosis not present

## 2023-09-22 ENCOUNTER — Telehealth: Payer: Self-pay | Admitting: Family Medicine

## 2023-09-22 MED ORDER — HYDROCODONE-ACETAMINOPHEN 5-325 MG PO TABS: 1.0000 | ORAL_TABLET | Freq: Four times a day (QID) | ORAL | 0 refills | Status: DC | PRN

## 2023-09-22 NOTE — Telephone Encounter (Signed)
Pt needs a refill on Hydrocodone sent to [facility].  She has 3 pills left.

## 2023-09-22 NOTE — Telephone Encounter (Signed)
Sent in one refill.   She needs office follow up.    Kristian Covey MD  Primary Care at [facility]

## 2023-09-23 NOTE — Telephone Encounter (Signed)
Patient informed of the message below and voiced understanding  

## 2023-09-30 ENCOUNTER — Other Ambulatory Visit: Payer: Self-pay | Admitting: Family Medicine

## 2023-10-18 ENCOUNTER — Ambulatory Visit (INDEPENDENT_AMBULATORY_CARE_PROVIDER_SITE_OTHER): Payer: Medicare HMO | Admitting: Family Medicine

## 2023-10-18 VITALS — BP 136/70 | HR 82 | Temp 97.9°F | Wt 163.5 lb

## 2023-10-18 DIAGNOSIS — M545 Low back pain, unspecified: Secondary | ICD-10-CM

## 2023-10-18 DIAGNOSIS — G8929 Other chronic pain: Secondary | ICD-10-CM | POA: Diagnosis not present

## 2023-10-18 DIAGNOSIS — Z23 Encounter for immunization: Secondary | ICD-10-CM | POA: Diagnosis not present

## 2023-10-18 DIAGNOSIS — E1140 Type 2 diabetes mellitus with diabetic neuropathy, unspecified: Secondary | ICD-10-CM

## 2023-10-18 DIAGNOSIS — Z7985 Long-term (current) use of injectable non-insulin antidiabetic drugs: Secondary | ICD-10-CM | POA: Diagnosis not present

## 2023-10-18 DIAGNOSIS — I10 Essential (primary) hypertension: Secondary | ICD-10-CM

## 2023-10-18 DIAGNOSIS — E785 Hyperlipidemia, unspecified: Secondary | ICD-10-CM

## 2023-10-18 DIAGNOSIS — E669 Obesity, unspecified: Secondary | ICD-10-CM

## 2023-10-18 LAB — POCT GLYCOSYLATED HEMOGLOBIN (HGB A1C): Hemoglobin A1C: 7.7 % — AB (ref 4.0–5.6)
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
## 2023-10-18 NOTE — Patient Instructions (Addendum)
Start back the Ozempic 0.25 mg [place] once weekly for one month and then if tolerating well increase to 0.5 mg [place] once weekly.  Set up 3 month follow up.

## 2023-10-19 ENCOUNTER — Telehealth: Payer: Self-pay

## 2023-10-19 NOTE — Progress Notes (Signed)
   Care Guide Note  10/19/2023 Name: Mary Caldwell MRN: [id number] DOB: 20-May-1946  Referred by: Kristian Covey, MD Reason for referral : Care Coordination (Outreach to schedule with Pharm d )   Mary Caldwell is a 77 y.o. year old female who is a primary care patient of Burchette, Elberta Fortis, MD. [name] was referred to the pharmacist for assistance related to DM.    Successful contact was made with the patient to discuss pharmacy services including being ready for the pharmacist to call at least 5 minutes before the scheduled appointment time, to have medication bottles and any blood sugar or blood pressure readings ready for review. The patient agreed to meet with the pharmacist via with the pharmacist via telephone visit on (date/time).  11/07/2023  Penne Lash , RMA     [place]  [facility] Guide  Direct Dial: [phone]  Website: [email or web address]

## 2023-11-02 ENCOUNTER — Telehealth: Payer: Self-pay

## 2023-11-02 NOTE — Telephone Encounter (Signed)
Mail out PAP for Toujeo,Jardiance,and Ozempic [employer]) and fax Dr.portion to the office today.

## 2023-11-03 ENCOUNTER — Other Ambulatory Visit: Payer: Self-pay | Admitting: Family Medicine

## 2023-11-07 ENCOUNTER — Other Ambulatory Visit (INDEPENDENT_AMBULATORY_CARE_PROVIDER_SITE_OTHER): Payer: Medicare HMO

## 2023-11-07 DIAGNOSIS — E1140 Type 2 diabetes mellitus with diabetic neuropathy, unspecified: Secondary | ICD-10-CM

## 2023-11-07 NOTE — Progress Notes (Signed)
11/07/2023 Name: Mary Caldwell MRN: [id number] DOB: 12/15/1945  Chief Complaint  Patient presents with   Diabetes   Medication Assistance    Mary Caldwell is a 77 y.o. year old female who presented for a telephone visit.   They were referred to the pharmacist by their PCP for assistance in managing diabetes.    Subjective:  Care Team: Primary Care Provider: Kristian Covey, MD ; Next Scheduled Visit: 01/18/24  Medication Access/Adherence  Current Pharmacy:  [facility] [address], [place] - [address] [place] [address] Phone: [phone] Fax: [phone]  [id number] - [place], [place] - [address] [place] [address] Phone: [phone] Fax: [phone]   Patient reports affordability concerns with their medications: No  - not as long as her existing PAPs are renewed Patient reports access/transportation concerns to their pharmacy: No  Patient reports adherence concerns with their medications:  No     Diabetes:  Current medications: Jardiance 25mg , Toujeo, Metformin 500mg  2 tabs BID Medications tried in the past: Amaryl, Invokana  Current glucose readings: Reports checking fasting sugars daily, 140 this morning, denies any low sugars and reports most are above 130 currently  Observed patterns:  Patient denies hypoglycemic s/sx including dizziness, shakiness, sweating. Patient denies hyperglycemic symptoms including polyuria, polydipsia, polyphagia, nocturia, neuropathy, blurred vision.  Injects Ozempic on Tuesdays, resumed on 10/18/23 at 0.25mg  after seeing PCP. Last 0.25mg  dose due 11/08/23   Current medication access support: Ozempic through [employer], Toujeo through [employer], Foster through [facility]  Hypertension:  Current medications: None Medications previously tried: None    Hyperlipidemia/ASCVD Risk Reduction  Current lipid lowering medications: Crestor 20mg , Fenofibrate  160mg  Medications tried in the past: Lipitor    Objective:  Lab Results  Component Value Date   HGBA1C 7.7 (A) 10/18/2023    Lab Results  Component Value Date   CREATININE 0.83 01/11/2023   BUN 19 01/11/2023   NA 138 01/11/2023   K 4.1 01/11/2023   CL 101 01/11/2023   CO2 27 01/11/2023    Lab Results  Component Value Date   CHOL 121 01/11/2023   HDL 32.00 (L) 01/11/2023   LDLDIRECT 47.0 01/11/2023   TRIG 389.0 (H) 01/11/2023   CHOLHDL 4 01/11/2023    Medications Reviewed Today     Reviewed by Sherrill Raring, RPH (Pharmacist) on 11/07/23 at 1940  Med List Status: <None>   Medication Order Taking? Sig Documenting Provider Last Dose Status Informant  albuterol (VENTOLIN HFA) 108 (90 Base) MCG/ACT inhaler [id number] No Inhale 2 puffs into the lungs every 4 (four) hours as needed for wheezing or shortness of breath (or coughing).  Patient not taking: Reported on 11/07/2023   Dione Booze, MD Not Taking Active   empagliflozin (JARDIANCE) 10 MG TABS tablet [id number] Yes TAKE 1 TABLET BY MOUTH ONCE DAILY . APPOINTMENT REQUIRED FOR FUTURE REFILLS Kristian Covey, MD Taking Active   fenofibrate 160 MG tablet [id number] Yes Take 1 tablet by mouth once daily Burchette, Elberta Fortis, MD Taking Active   HYDROcodone-acetaminophen (NORCO/VICODIN) 5-325 MG tablet [id number] Yes Take 1 tablet by mouth every 6 (six) hours as needed for moderate pain (pain score 4-6). Burchette, Elberta Fortis, MD Taking Active   insulin glargine, 2 Unit Dial, (TOUJEO MAX SOLOSTAR) 300 UNIT/ML Solostar Pen [id number]  INJECT 20 UNITS SUBCUTANEOUSLY ONCE DAILY AT BEDTIME  Patient taking differently: INJECT 40 UNITS SUBCUTANEOUSLY ONCE DAILY AT BEDTIME   Burchette,  Elberta Fortis, MD  Active            Med Note Barnie Mort Nov 07, 2023  2:39 PM) 42 units  metFORMIN (GLUCOPHAGE) 500 MG tablet [id number] Yes TAKE 2 TABLETS BY MOUTH TWICE DAILY WITH FOOD Burchette, Elberta Fortis, MD Taking Active    neomycin-colistin-hydrocortisone-thonzonium (CORTISPORIN-TC) 3.01-22-09-0.5 MG/ML OTIC suspension [id number]  Place 3 drops into the right ear 4 (four) times daily. Kristian Covey, MD  Active   rosuvastatin (CRESTOR) 20 MG tablet [id number] Yes TAKE 1 TABLET BY MOUTH AT BEDTIME Burchette, Elberta Fortis, MD Taking Active   Semaglutide,0.25 or 0.5MG /DOS, (OZEMPIC, 0.25 OR 0.5 MG/DOSE,) 2 MG/1.5ML SOPN [id number]  Inject 0.25 mg into the skin once a week. [provider]  Active               Assessment/Plan:   Diabetes: - Currently uncontrolled - Reviewed long term cardiovascular and renal outcomes of uncontrolled blood sugar - Reviewed goal A1c, goal fasting, and goal 2 hour post prandial glucose - Reviewed dietary modifications including low carb diet - Recommend to increase Ozempic to 0.5mg  on 12/24  - Patient denies personal or family history of multiple endocrine neoplasia type 2, medullary thyroid cancer; personal history of pancreatitis or gallbladder disease. - Recommend to check glucose once daily, at varying times of day -2025 PAP applications for renewal prepared and awaiting patient's signature at front office, per her request (Jardiance/Ozempic/Toujeo)     Hypertension: - Currently controlled - Future consideration: addition of ACE/ARB for kidney protection in presence of diabetes     Hyperlipidemia/ASCVD Risk Reduction: - Currently controlled.  - Reviewed long term complications of uncontrolled cholesterol - Recommend to continue current medication therapy    Follow Up Plan: 12/05/23  Sherrill Raring, PharmD Clinical Pharmacist [phone]

## 2023-11-09 ENCOUNTER — Emergency Department (HOSPITAL_BASED_OUTPATIENT_CLINIC_OR_DEPARTMENT_OTHER)
Admission: EM | Admit: 2023-11-09 | Discharge: 2023-11-09 | Disposition: A | Discharge status: Home / Self Care | Payer: Medicare HMO | Attending: Emergency Medicine | Admitting: Emergency Medicine

## 2023-11-09 ENCOUNTER — Encounter (HOSPITAL_BASED_OUTPATIENT_CLINIC_OR_DEPARTMENT_OTHER): Payer: Self-pay

## 2023-11-09 ENCOUNTER — Emergency Department (HOSPITAL_BASED_OUTPATIENT_CLINIC_OR_DEPARTMENT_OTHER): Payer: Medicare HMO

## 2023-11-09 DIAGNOSIS — K5792 Diverticulitis of intestine, part unspecified, without perforation or abscess without bleeding: Secondary | ICD-10-CM

## 2023-11-09 DIAGNOSIS — R1032 Left lower quadrant pain: Secondary | ICD-10-CM | POA: Diagnosis present

## 2023-11-09 DIAGNOSIS — J45909 Unspecified asthma, uncomplicated: Secondary | ICD-10-CM | POA: Diagnosis not present

## 2023-11-09 DIAGNOSIS — K8020 Calculus of gallbladder without cholecystitis without obstruction: Secondary | ICD-10-CM | POA: Diagnosis not present

## 2023-11-09 DIAGNOSIS — E119 Type 2 diabetes mellitus without complications: Secondary | ICD-10-CM | POA: Insufficient documentation

## 2023-11-09 DIAGNOSIS — N281 Cyst of kidney, acquired: Secondary | ICD-10-CM | POA: Diagnosis not present

## 2023-11-09 DIAGNOSIS — K5732 Diverticulitis of large intestine without perforation or abscess without bleeding: Secondary | ICD-10-CM | POA: Diagnosis not present

## 2023-11-09 DIAGNOSIS — I10 Essential (primary) hypertension: Secondary | ICD-10-CM | POA: Diagnosis not present

## 2023-11-09 DIAGNOSIS — Z79899 Other long term (current) drug therapy: Secondary | ICD-10-CM | POA: Diagnosis not present

## 2023-11-09 DIAGNOSIS — Z7984 Long term (current) use of oral hypoglycemic drugs: Secondary | ICD-10-CM | POA: Insufficient documentation

## 2023-11-09 DIAGNOSIS — K8080 Other cholelithiasis without obstruction: Secondary | ICD-10-CM

## 2023-11-09 DIAGNOSIS — Z794 Long term (current) use of insulin: Secondary | ICD-10-CM | POA: Insufficient documentation

## 2023-11-09 LAB — COMPREHENSIVE METABOLIC PANEL
ALT: 15 U/L (ref 0–44)
AST: 19 U/L (ref 15–41)
Albumin: 3.9 g/dL (ref 3.5–5.0)
Alkaline Phosphatase: 31 U/L — ABNORMAL LOW (ref 38–126)
Anion gap: 9 (ref 5–15)
BUN: 12 mg/dL (ref 8–23)
CO2: 25 mmol/L (ref 22–32)
Calcium: 9.5 mg/dL (ref 8.9–10.3)
Chloride: 105 mmol/L (ref 98–111)
Creatinine, Ser: 0.68 mg/dL (ref 0.44–1.00)
GFR, Estimated: >60 mL/min (ref >60)
Glucose, Bld: 177 mg/dL — ABNORMAL HIGH (ref 70–99)
Potassium: 3.7 mmol/L (ref 3.5–5.1)
Sodium: 139 mmol/L (ref 135–145)
Total Bilirubin: 0.6 mg/dL (ref <1.2)
Total Protein: 7 g/dL (ref 6.5–8.1)

## 2023-11-09 LAB — CBC WITH DIFFERENTIAL/PLATELET
Abs Immature Granulocytes: 0.01 10*3/uL (ref 0.00–0.07)
Basophils Absolute: 0 10*3/uL (ref 0.0–0.1)
Basophils Relative: 0 %
Eosinophils Absolute: 0.2 10*3/uL (ref 0.0–0.5)
Eosinophils Relative: 2 %
HCT: 41.9 % (ref 36.0–46.0)
Hemoglobin: 14.2 g/dL (ref 12.0–15.0)
Immature Granulocytes: 0 %
Lymphocytes Relative: 22 %
Lymphs Abs: 1.9 10*3/uL (ref 0.7–4.0)
MCH: 30.5 pg (ref 26.0–34.0)
MCHC: 33.9 g/dL (ref 30.0–36.0)
MCV: 89.9 fL (ref 80.0–100.0)
Monocytes Absolute: 0.5 10*3/uL (ref 0.1–1.0)
Monocytes Relative: 6 %
Neutro Abs: 5.9 10*3/uL (ref 1.7–7.7)
Neutrophils Relative %: 70 %
Platelets: 299 10*3/uL (ref 150–400)
RBC: 4.66 MIL/uL (ref 3.87–5.11)
RDW: 12.4 % (ref 11.5–15.5)
WBC: 8.5 10*3/uL (ref 4.0–10.5)
nRBC: 0 % (ref 0.0–0.2)

## 2023-11-09 LAB — LIPASE, BLOOD: Lipase: 35 U/L (ref 11–51)

## 2023-11-09 MED ORDER — SODIUM CHLORIDE 0.9 % IV BOLUS
1000.0000 mL | Freq: Once | INTRAVENOUS | Status: AC
  Administered 2023-11-09: 1000 mL via INTRAVENOUS

## 2023-11-09 MED ORDER — OXYCODONE HCL 5 MG PO TABS: 2.5000 mg | ORAL_TABLET | ORAL | 0 refills | Status: DC | PRN

## 2023-11-09 MED ORDER — DICYCLOMINE HCL 20 MG PO TABS: 10.0000 mg | ORAL_TABLET | Freq: Two times a day (BID) | ORAL | 0 refills | Status: AC | PRN

## 2023-11-09 MED ORDER — IOHEXOL 300 MG/ML  SOLN
100.0000 mL | Freq: Once | INTRAMUSCULAR | Status: AC | PRN
  Administered 2023-11-09: 100 mL via INTRAVENOUS

## 2023-11-09 MED ORDER — MORPHINE SULFATE (PF) 2 MG/ML IV SOLN
2.0000 mg | Freq: Once | INTRAVENOUS | Status: AC
Start: 2023-11-09 — End: 2023-11-09
  Administered 2023-11-09: 2 mg via INTRAVENOUS
  Filled 2023-11-09: qty 1

## 2023-11-09 MED ORDER — ONDANSETRON HCL 4 MG/2ML IJ SOLN
4.0000 mg | Freq: Once | INTRAMUSCULAR | Status: AC
Start: 2023-11-09 — End: 2023-11-09
  Administered 2023-11-09: 4 mg via INTRAVENOUS
  Filled 2023-11-09: qty 2

## 2023-11-09 MED ORDER — FAMOTIDINE IN NACL 20-0.9 MG/50ML-% IV SOLN
20.0000 mg | Freq: Once | INTRAVENOUS | Status: AC
  Administered 2023-11-09: 20 mg via INTRAVENOUS
  Filled 2023-11-09: qty 50

## 2023-11-09 MED ORDER — AMOXICILLIN-POT CLAVULANATE 875-125 MG PO TABS: 1.0000 | ORAL_TABLET | Freq: Two times a day (BID) | ORAL | 0 refills | Status: DC
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
## 2023-11-09 NOTE — ED Triage Notes (Signed)
Pt complaints of left side pain. Denies nausea and vomiting.

## 2023-11-09 NOTE — ED Notes (Signed)
ED Provider at bedside. 

## 2023-11-09 NOTE — Discharge Instructions (Addendum)
It was a pleasure caring for you today in the emergency department.  Pain likely secondary to diverticulitis, this should resolve with antibiotics.  Please follow with gastroenterology in 6 weeks for colonoscopy.  Also evidence of some gallstones, does not appear to be infected at this time.  Please follow with your PCP  Please return to the emergency department for any worsening or worrisome symptoms.

## 2023-11-09 NOTE — ED Notes (Signed)
Patient transported to CT 

## 2023-11-11 ENCOUNTER — Telehealth: Payer: Self-pay

## 2023-11-11 NOTE — Telephone Encounter (Signed)
Spoke with patient. She wanted to confirm that I had received her patient assistance paperwork. Informed patient that I will be back in the office on Monday to confirm and will contact her if I need anything else to submit.  Sherrill Raring, PharmD Clinical Pharmacist [phone]

## 2023-11-11 NOTE — Telephone Encounter (Signed)
Copied from CRM [id number]. Topic: Clinical - Medication Question >> Nov 10, 2023  4:41 PM Tiffany H wrote: Reason for CRM: Patient called to request to speak with [name] Pharmacist regarding Ozempic prescription. Please assist.

## 2023-11-14 ENCOUNTER — Telehealth: Payer: Self-pay

## 2023-11-14 NOTE — Transitions of Care (Post Inpatient/ED Visit) (Unsigned)
   11/14/2023  Name: Mary Caldwell MRN: [id number] DOB: [birth year]  Today's TOC FU Call Status: Today's TOC FU Call Status:: Unsuccessful Call (1st Attempt) Unsuccessful Call (1st Attempt) Date: 11/14/23  Attempted to reach the patient regarding the most recent Inpatient/ED visit.  Follow Up Plan: Additional outreach attempts will be made to reach the patient to complete the Transitions of Care (Post Inpatient/ED visit) call.    Delynn Olvera, CMA  [facility] Team Direct Dial: [phone]

## 2023-11-17 NOTE — Transitions of Care (Post Inpatient/ED Visit) (Signed)
   11/17/2023  Name: Mary Caldwell MRN: [id number] DOB: 12-03-1945  Today's TOC FU Call Status: Today's TOC FU Call Status:: Unsuccessful Call (2nd Attempt) Unsuccessful Call (1st Attempt) Date: 11/14/23 Unsuccessful Call (2nd Attempt) Date: 11/17/23  Attempted to reach the patient regarding the most recent Inpatient/ED visit.  Follow Up Plan: Additional outreach attempts will be made to reach the patient to complete the Transitions of Care (Post Inpatient/ED visit) call.    Sapna Padron, CMA  [facility] Team Direct Dial: [phone]

## 2023-11-19 ENCOUNTER — Other Ambulatory Visit: Payer: Self-pay | Admitting: Family Medicine

## 2023-11-21 ENCOUNTER — Other Ambulatory Visit: Payer: Self-pay | Admitting: Family Medicine

## 2023-11-21 NOTE — Telephone Encounter (Signed)
Copied from CRM [id number]. Topic: Clinical - Medication Refill >> Nov 21, 2023  1:36 PM Pascal Lux wrote: Most Recent Primary Care Visit:  Provider: Delano Metz H  Department: [facility]  Visit Type: PATIENT OUTREACH 60  Date: 11/07/2023  Medication: fenofibrate 160 MG tablet [[id number]] rosuvastatin (CRESTOR) 20 MG tablet [[id number]]  Has the patient contacted their pharmacy? Yes (Agent: If no, request that the patient contact the pharmacy for the refill. If patient does not wish to contact the pharmacy document the reason why and proceed with request.) (Agent: If yes, when and what did the pharmacy advise?)  Is this the correct pharmacy for this prescription? Yes If no, delete pharmacy and type the correct one.  This is the patient's preferred pharmacy:  [facility] [address] [place] - [address] [place] [address] Phone: [phone] Fax: [phone]     Has the prescription been filled recently? Yes  Is the patient out of the medication? Yes  Has the patient been seen for an appointment in the last year OR does the patient have an upcoming appointment? Yes  Can we respond through MyChart? No  Agent: Please be advised that Rx refills may take up to 3 business days. We ask that you follow-up with your pharmacy.

## 2023-11-21 NOTE — Telephone Encounter (Signed)
Copied from CRM [id number]. Topic: Clinical - Medication Refill >> Nov 21, 2023  1:27 PM Pascal Lux wrote: Most Recent Primary Care Visit:  Provider: Delano Metz H  Department: [facility]  Visit Type: PATIENT OUTREACH 60  Date: 11/07/2023  Medication: fenofibrate 160 MG tablet [[id number]] rosuvastatin (CRESTOR) 20 MG tablet [[id number]]  Has the patient contacted their pharmacy? Yes (Agent: If no, request that the patient contact the pharmacy for the refill. If patient does not wish to contact the pharmacy document the reason why and proceed with request.) (Agent: If yes, when and what did the pharmacy advise?)  Is this the correct pharmacy for this prescription? Yes If no, delete pharmacy and type the correct one.  This is the patient's preferred pharmacy:  [facility] [address], [place] - [address] [place] [address] Phone: [phone] Fax: [phone]     Has the prescription been filled recently? Yes  Is the patient out of the medication? Yes  Has the patient been seen for an appointment in the last year OR does the patient have an upcoming appointment? Yes  Can we respond through MyChart? No  Agent: Please be advised that Rx refills may take up to 3 business days. We ask that you follow-up with your pharmacy.

## 2023-11-22 NOTE — Transitions of Care (Post Inpatient/ED Visit) (Signed)
   11/22/2023  Name: Mary Caldwell MRN: [id number] DOB: 02-Apr-1946  Today's TOC FU Call Status: Today's TOC FU Call Status:: Unsuccessful Call (3rd Attempt) Unsuccessful Call (1st Attempt) Date: 11/14/23 Unsuccessful Call (2nd Attempt) Date: 11/17/23 Unsuccessful Call (3rd Attempt) Date: 11/22/23  Attempted to reach the patient regarding the most recent Inpatient/ED visit.  Follow Up Plan: No further outreach attempts will be made at this time. We have been unable to contact the patient.  Wadie Mattie, CMA  [facility] Team Direct Dial: [phone]

## 2023-12-02 NOTE — Progress Notes (Signed)
 12/05/2023 Name: Mary Caldwell MRN: [id number] DOB: March 03, 1946  Chief Complaint  Patient presents with   Diabetes   Medication Management    Mary Caldwell is a 78 y.o. year old female who presented for a telephone visit.   They were referred to the pharmacist by their PCP for assistance in managing diabetes.    Subjective:  Care Team: Primary Care Provider: Micheal Wolm ORN, MD ; Next Scheduled Visit: 01/18/24  Medication Access/Adherence  Current Pharmacy:  [facility] [address] [place] - [address] [place] [address] Phone: [phone] Fax: [phone]  [id number] - [place], [place] - [address] [place] [address] Phone: [phone] Fax: [phone]   Patient reports affordability concerns with their medications: No  - Patient reports access/transportation concerns to their pharmacy: No  Patient reports adherence concerns with their medications:  No     Diabetes:  Current medications: Jardiance  25mg , Toujeo  42 units with breakfast, Metformin  500mg  2 tabs BID, Ozempic  0.25mg  Medications tried in the past: Amaryl , Invokana   Current glucose readings: checking in the morning, fasting - 130 today, denies any lows and reports some on occasion being above 150 if she had snacks the night before. Not checking post-prandial  Observed patterns:  Patient denies hypoglycemic s/sx including dizziness, shakiness, sweating. Patient denies hyperglycemic symptoms including polyuria, polydipsia, polyphagia, nocturia, neuropathy, blurred vision.  Injects Ozempic  on Tuesdays, resumed on 10/18/23 at 0.25mg  after seeing PCP. Last 0.25mg  dose due 11/08/23   Current medication access support: Ozempic  through [employer], Toujeo  through [employer], Jardiance  through [facility]   Objective:  Lab Results  Component Value Date   HGBA1C 7.7 (A) 10/18/2023    Lab Results  Component Value Date   CREATININE 0.68  11/09/2023   BUN 12 11/09/2023   NA 139 11/09/2023   K 3.7 11/09/2023   CL 105 11/09/2023   CO2 25 11/09/2023    Lab Results  Component Value Date   CHOL 121 01/11/2023   HDL 32.00 (L) 01/11/2023   LDLDIRECT 47.0 01/11/2023   TRIG 389.0 (H) 01/11/2023   CHOLHDL 4 01/11/2023    Medications Reviewed Today     Reviewed by Lionell Jon DEL, RPH (Pharmacist) on 12/05/23 at 1452  Med List Status: <None>   Medication Order Taking? Sig Documenting Provider Last Dose Status Informant  dicyclomine  (BENTYL ) 20 MG tablet [id number]  Take 0.5 tablets (10 mg total) by mouth 2 (two) times daily as needed for spasms. Elnor Jayson LABOR, DO  Active            Med Note JANEAN JON DEL Pablo Dec 05, 2023  2:32 PM) Has some on hand prn  empagliflozin  (JARDIANCE ) 10 MG TABS tablet [id number] Yes TAKE 1 TABLET BY MOUTH ONCE DAILY . APPOINTMENT REQUIRED FOR FUTURE REFILLS Micheal Wolm ORN, MD Taking Active   fenofibrate  160 MG tablet [id number] Yes Take 1 tablet by mouth once daily Burchette, Wolm ORN, MD Taking Active   HYDROcodone -acetaminophen  (NORCO/VICODIN) 5-325 MG tablet [id number]  Take 1 tablet by mouth every 6 (six) hours as needed for moderate pain (pain score 4-6). Micheal Wolm ORN, MD  Active            Med Note JANEAN JON DEL Pablo Dec 05, 2023  2:34 PM) Prn, sparingly  insulin  glargine, 2 Unit Dial, (TOUJEO  MAX SOLOSTAR) 300 UNIT/ML Solostar Pen [id number] Yes INJECT 20 UNITS SUBCUTANEOUSLY ONCE DAILY AT BEDTIME  Patient taking differently: INJECT 40 UNITS SUBCUTANEOUSLY ONCE DAILY AT BEDTIME   Micheal Wolm ORN, MD Taking Active            Med Note JANEAN JON VEAR Pablo Dec 05, 2023  2:34 PM) 42 units in the morning  metFORMIN  (GLUCOPHAGE ) 500 MG tablet [id number] Yes TAKE 2 TABLETS BY MOUTH TWICE DAILY WITH FOOD Burchette, Wolm ORN, MD Taking Active   oxyCODONE  (ROXICODONE ) 5 MG immediate release tablet [id number]  Take 0.5-1 tablets (2.5-5 mg total) by mouth every 4 (four) hours as  needed. Elnor Jayson LABOR, DO  Active            Med Note JANEAN JON VEAR Pablo Dec 05, 2023  2:35 PM) Not taking  rosuvastatin  (CRESTOR ) 20 MG tablet [id number] Yes TAKE 1 TABLET BY MOUTH AT BEDTIME Burchette, Wolm ORN, MD Taking Active   Semaglutide ,0.25 or 0.5MG /DOS, (OZEMPIC , 0.25 OR 0.5 MG/DOSE,) 2 MG/1.5ML SOPN [id number]  Inject 0.25 mg into the skin once a week. [provider]  Active            Med Note JANEAN JON VEAR Pablo Dec 05, 2023  2:37 PM) Wednesdays              Assessment/Plan:   Diabetes: - Currently uncontrolled - Reviewed long term cardiovascular and renal outcomes of uncontrolled blood sugar - Reviewed goal A1c, goal fasting, and goal 2 hour post prandial glucose - Reviewed dietary modifications including low carb diet - Recommend to increase Ozempic  to 0.5mg  at next dose due on 12/07/23 - Patient denies personal or family history of multiple endocrine neoplasia type 2, medullary thyroid  cancer; personal history of pancreatitis or gallbladder disease. - Recommend to check glucose once daily, at varying times of day     Follow Up Plan: 2 months (sees PCP in 1 month)   [name], PharmD Clinical Pharmacist [phone]

## 2023-12-05 ENCOUNTER — Other Ambulatory Visit (INDEPENDENT_AMBULATORY_CARE_PROVIDER_SITE_OTHER): Payer: Medicare HMO

## 2023-12-05 DIAGNOSIS — E1140 Type 2 diabetes mellitus with diabetic neuropathy, unspecified: Secondary | ICD-10-CM

## 2023-12-06 ENCOUNTER — Other Ambulatory Visit: Payer: Self-pay | Admitting: Family Medicine

## 2023-12-12 NOTE — Telephone Encounter (Signed)
Spoke wit Mrs. Mary Caldwell explain she has been APPROVED FOR 2025 on [place], [name] help with her with pap,pt has already received a refill on Jardiance. And waiting on her other refills.

## 2023-12-15 ENCOUNTER — Telehealth: Payer: Self-pay | Admitting: *Deleted

## 2023-12-15 NOTE — Telephone Encounter (Signed)
Samples of Ozempic is available for patient from patient assistance.  Patient is aware.

## 2023-12-28 ENCOUNTER — Ambulatory Visit: Payer: Self-pay | Admitting: Family Medicine

## 2023-12-28 ENCOUNTER — Encounter: Payer: Self-pay | Admitting: Internal Medicine

## 2023-12-28 ENCOUNTER — Ambulatory Visit: Payer: Medicare HMO | Admitting: Internal Medicine

## 2023-12-28 VITALS — BP 164/76 | HR 97 | Temp 98.6°F | Ht 60.0 in | Wt 162.2 lb

## 2023-12-28 DIAGNOSIS — J22 Unspecified acute lower respiratory infection: Secondary | ICD-10-CM

## 2023-12-28 DIAGNOSIS — J029 Acute pharyngitis, unspecified: Secondary | ICD-10-CM

## 2023-12-28 DIAGNOSIS — J40 Bronchitis, not specified as acute or chronic: Secondary | ICD-10-CM | POA: Diagnosis not present

## 2023-12-28 DIAGNOSIS — R059 Cough, unspecified: Secondary | ICD-10-CM | POA: Diagnosis not present

## 2023-12-28 LAB — POCT INFLUENZA A/B
Influenza A, POC: NEGATIVE
Influenza B, POC: NEGATIVE

## 2023-12-28 LAB — POCT RAPID STREP A (OFFICE): Rapid Strep A Screen: NEGATIVE

## 2023-12-28 LAB — POC COVID19 BINAXNOW: SARS Coronavirus 2 Ag: NEGATIVE

## 2023-12-28 MED ORDER — AZITHROMYCIN 250 MG PO TABS: ORAL_TABLET | ORAL | 0 refills | Status: AC

## 2023-12-28 NOTE — Progress Notes (Signed)
 Chief Complaint  Patient presents with   Cough    Pt reports she is coughing up milky color mucus.  Pt reports she is taking cough syrup. Has trouble sleeping from coughing. Sx started on Monday, 2 days ago. Also chest congestion.    Sore Throat   Nasal Congestion    Runny nose   Ear Pain    On both sides   Cold Extremity    Pt reports she feels cold on feet and hands.   Headache    HPI: Mary Caldwell 78 y.o. come in for sda  PCP NA  see above   Onset  with sneezing  . And then runny nose  scratchy thraot and now cough mostlyu clear some cloudy  no wheezing no fever   sides hurt from coughing . Though she should get checked. Lives alone  independent . SABRA Ears are  hurting  uncomfortable  but no  change and no sinus pain but some frontal HA mild.  Community exposures and no others  noted. Had flu vaccine. Denies sig lung disease.   Got cough med to try.  May have hs of some allergy  ROS: See pertinent positives and negatives per HPI.  Past Medical History:  Diagnosis Date   ABDOMINAL ABSCESS 12/29/2009   ASTHMA 02/11/2009   DIABETES-TYPE 2 02/11/2009   ESSENTIAL HYPERTENSION 02/11/2009   HYPERLIPIDEMIA 02/11/2009   OBESITY 02/11/2009    Family History  Problem Relation Age of Onset   Arthritis Other    Heart disease Other        grandparent   Diabetes Other        parent   Colon cancer Sister     Social History   Socioeconomic History   Marital status: Widowed    Spouse name: Not on file   Number of children: Not on file   Years of education: Not on file   Highest education level: Not on file  Occupational History   Not on file  Tobacco Use   Smoking status: Never   Smokeless tobacco: Never  Vaping Use   Vaping status: Never Used  Substance and Sexual Activity   Alcohol use: Yes    Alcohol/week: 0.0 standard drinks of alcohol    Comment: only on rare holidays   Drug use: No   Sexual activity: Not Currently  Other Topics Concern   Not on file  Social  History Narrative   Not on file   Social Drivers of Health   Financial Resource Strain: Medium Risk (05/12/2022)   Overall Financial Resource Strain (CARDIA)    Difficulty of Paying Living Expenses: Somewhat hard  Food Insecurity: No Food Insecurity (03/01/2022)   Hunger Vital Sign    Worried About Running Out of Food in the Last Year: Never true    Ran Out of Food in the Last Year: Never true  Transportation Needs: No Transportation Needs (03/01/2022)   PRAPARE - Administrator, Civil Service (Medical): No    Lack of Transportation (Non-Medical): No  Physical Activity: Insufficiently Active (03/01/2022)   Exercise Vital Sign    Days of Exercise per Week: 2 days    Minutes of Exercise per Session: 60 min  Stress: No Stress Concern Present (03/01/2022)   [employer] of Occupational Health - Occupational Stress Questionnaire    Feeling of Stress : Not at all  Social Connections: Moderately Integrated (03/01/2022)   Social Connection and Isolation Panel [NHANES]    Frequency  of Communication with Friends and Family: More than three times a week    Frequency of Social Gatherings with Friends and Family: More than three times a week    Attends Religious Services: More than 4 times per year    Active Member of [employer] or Organizations: Yes    Attends Banker Meetings: More than 4 times per year    Marital Status: Widowed    Outpatient Medications Prior to Visit  Medication Sig Dispense Refill   empagliflozin  (JARDIANCE ) 10 MG TABS tablet TAKE 1 TABLET BY MOUTH ONCE DAILY . APPOINTMENT REQUIRED FOR FUTURE REFILLS 30 tablet 1   fenofibrate  160 MG tablet Take 1 tablet by mouth once daily 90 tablet 0   HYDROcodone -acetaminophen  (NORCO/VICODIN) 5-325 MG tablet Take 1 tablet by mouth every 6 (six) hours as needed for moderate pain (pain score 4-6). 20 tablet 0   insulin  glargine, 2 Unit Dial, (TOUJEO  MAX SOLOSTAR) 300 UNIT/ML Solostar Pen INJECT 20 UNITS SUBCUTANEOUSLY  ONCE DAILY AT BEDTIME (Patient taking differently: INJECT 40 UNITS SUBCUTANEOUSLY ONCE DAILY AT BEDTIME) 6 mL 0   metFORMIN  (GLUCOPHAGE ) 500 MG tablet TAKE 2 TABLETS BY MOUTH TWICE DAILY WITH FOOD 360 tablet 0   rosuvastatin  (CRESTOR ) 20 MG tablet TAKE 1 TABLET BY MOUTH AT BEDTIME 90 tablet 0   Semaglutide ,0.25 or 0.5MG /DOS, (OZEMPIC , 0.25 OR 0.5 MG/DOSE,) 2 MG/1.5ML SOPN Inject 0.25 mg into the skin once a week.     dicyclomine  (BENTYL ) 20 MG tablet Take 0.5 tablets (10 mg total) by mouth 2 (two) times daily as needed for spasms. (Patient not taking: Reported on 12/28/2023) 20 tablet 0   oxyCODONE  (ROXICODONE ) 5 MG immediate release tablet Take 0.5-1 tablets (2.5-5 mg total) by mouth every 4 (four) hours as needed. (Patient not taking: Reported on 12/28/2023) 5 tablet 0   No facility-administered medications prior to visit.     EXAM:  BP (!) 164/76 (BP Location: Left Arm, Patient Position: Sitting, Cuff Size: Normal)   Pulse 97   Temp 98.6 F (37 C) (Oral)   Ht 5' (1.524 m)   Wt 162 lb 3.2 oz (73.6 kg)   SpO2 96%   BMI 31.68 kg/m   Body mass index is 31.68 kg/m. BP Readings from Last 3 Encounters:  12/28/23 (!) 164/76  11/09/23 126/62  10/18/23 136/70    GENERAL: vitals reviewed and listed above, alert, oriented, appears well hydrated and in no acute distress congested  runny nose  no resp dstress  cough is bronchial sounding  HEENT: atraumatic, conjunctiva  clear, no obvious abnormalities on inspection of external nose and ears  r tm grey left eac soft yellow wax OP : no lesion edema or exudate  NECK: no obvious masses on inspection palpation  LUNGS: good bs ocass tubular sounds no rales and no wheezing  CV: HRRR, no clubbing cyanosis  nl cap refill  no color change hands  MS: moves all extremities without noticeable focal  abnormality  ambulatory gait  PSYCH: pleasant and cooperative, no obvious depression or anxiety Lab Results  Component Value Date   WBC 8.5 11/09/2023   HGB  14.2 11/09/2023   HCT 41.9 11/09/2023   PLT 299 11/09/2023   GLUCOSE 177 (H) 11/09/2023   CHOL 121 01/11/2023   TRIG 389.0 (H) 01/11/2023   HDL 32.00 (L) 01/11/2023   LDLDIRECT 47.0 01/11/2023   ALT 15 11/09/2023   AST 19 11/09/2023   NA 139 11/09/2023   K 3.7 11/09/2023   CL 105  11/09/2023   CREATININE 0.68 11/09/2023   BUN 12 11/09/2023   CO2 25 11/09/2023   TSH 1.71 12/01/2020   HGBA1C 7.7 (A) 10/18/2023   MICROALBUR <0.7 10/11/2022   BP Readings from Last 3 Encounters:  12/28/23 (!) 164/76  11/09/23 126/62  10/18/23 136/70    ASSESSMENT AND PLAN:  Discussed the following assessment and plan:  Acute respiratory infection  Cough, unspecified type - Plan: POC COVID-19, POC Influenza A/B  Bronchitis  Sore throat - Plan: POC COVID-19, POC Influenza A/B, POC Rapid Strep A Bronchitis sx  and exam  suspect viral but at risk and  consider empiric rx for atypical's   Rx for azithro if chest sx getting worse over next 48 hours  and into weekend. Disc confort care and   Expectant management.  -Patient advised to return or notify health care team  if  new concerns arise.  Patient Instructions  Covid strep and flu tests are negative  Could be a viral respiratory infection that can run its course with cough and bronchitis . However if chest cough congestion  is getting worse  over the next 2 days  add antibiotic  in case  bacteria infection . I will send in antibiotic to your pharmacy and you can get filled if need to add  Blood pressure is up today  although has been  ok in past  notes.   Cough can last  2  weeks but should feel better after the weekend and if getting short of breath or  serious chest pain then  seek care with medical team   R ear ok left ear canal has some wax  but do not suspect ear infection  .   If ongoin problem then fu and we can  try removed the wax in left ear .    Laila Myhre K. Keanthony Poole M.D.

## 2023-12-28 NOTE — Patient Instructions (Addendum)
 Covid strep and flu tests are negative  Could be a viral respiratory infection that can run its course with cough and bronchitis . However if chest cough congestion  is getting worse  over the next 2 days  add antibiotic  in case  bacteria infection . I will send in antibiotic to your pharmacy and you can get filled if need to add  Blood pressure is up today  although has been  ok in past  notes.   Cough can last  2  weeks but should feel better after the weekend and if getting short of breath or  serious chest pain then  seek care with medical team   R ear ok left ear canal has some wax  but do not suspect ear infection  .   If ongoin problem then fu and we can  try removed the wax in left ear .

## 2023-12-28 NOTE — Telephone Encounter (Signed)
 Copied from CRM [id number]. Topic: Clinical - Red Word Triage >> Dec 28, 2023  8:04 AM Robinson H wrote: [facility] that prompted transfer to Nurse Triage: Cough getting worse, throat and ears hurt, spitting up milky colored mucus  Chief Complaint: cough, runny nose, ear aches, sore throat Symptoms: cough, white milky sputum, ear pain, sore throat Frequency: started Monday Pertinent Negatives: Patient denies fever, sob Disposition: [] ED /[] Urgent Care (no appt availability in office) / [x] Appointment(In office/virtual)/ []  [facility]/ [] Home Care/ [] Refused Recommended Disposition /[] [facility]/ []  Follow-up with PCP Additional Notes: States started with runny nose and developed a cough, sore throat and ear aches.  Apt made for this afternoon.  Denies fever and sob.  Care advice given, denies questions, instructed to go to er if becomes worse.   Reason for Disposition  Cough has been present for > 3 weeks  Answer Assessment - Initial Assessment Questions 1. ONSET: When did the cough begin?      Monday night, started sneezing and the next day it was full blown, throat sore and both ears hurt 2. SEVERITY: How bad is the cough today?      moderate 3. SPUTUM: Describe the color of your sputum (none, dry cough; clear, white, yellow, green)     White milky 4. HEMOPTYSIS: Are you coughing up any blood? If so ask: How much? (flecks, streaks, tablespoons, etc.)     denies 5. DIFFICULTY BREATHING: Are you having difficulty breathing? If Yes, ask: How bad is it? (e.g., mild, moderate, severe)    - MILD: No SOB at rest, mild SOB with walking, speaks normally in sentences, can lie down, no retractions, pulse < 100.    - MODERATE: SOB at rest, SOB with minimal exertion and prefers to sit, cannot lie down flat, speaks in phrases, mild retractions, audible wheezing, pulse 100-120.    - SEVERE: Very SOB at rest, speaks in single words, struggling to breathe, sitting hunched  forward, retractions, pulse > 120      denies 6. FEVER: Do you have a fever? If Yes, ask: What is your temperature, how was it measured, and when did it start?     denies 7. CARDIAC HISTORY: Do you have any history of heart disease? (e.g., heart attack, congestive heart failure)      denies 8. LUNG HISTORY: Do you have any history of lung disease?  (e.g., pulmonary embolus, asthma, emphysema)     denies 9. PE RISK FACTORS: Do you have a history of blood clots? (or: recent major surgery, recent prolonged travel, bedridden)     denies 10. OTHER SYMPTOMS: Do you have any other symptoms? (e.g., runny nose, wheezing, chest pain)       Runny nose 11. PREGNANCY: Is there any chance you are pregnant? When was your last menstrual period?       na 12. TRAVEL: Have you traveled out of the country in the last month? (e.g., travel history, exposures)       na  Protocols used: Cough - Acute Productive-A-AH

## 2024-01-18 ENCOUNTER — Encounter: Payer: Self-pay | Admitting: Family Medicine

## 2024-01-18 ENCOUNTER — Ambulatory Visit (INDEPENDENT_AMBULATORY_CARE_PROVIDER_SITE_OTHER): Payer: Medicare HMO | Admitting: Family Medicine

## 2024-01-18 VITALS — BP 138/70 | HR 79 | Temp 97.9°F | Wt 162.7 lb

## 2024-01-18 DIAGNOSIS — L84 Corns and callosities: Secondary | ICD-10-CM | POA: Diagnosis not present

## 2024-01-18 DIAGNOSIS — E1140 Type 2 diabetes mellitus with diabetic neuropathy, unspecified: Secondary | ICD-10-CM

## 2024-01-18 DIAGNOSIS — I10 Essential (primary) hypertension: Secondary | ICD-10-CM

## 2024-01-18 DIAGNOSIS — Z7985 Long-term (current) use of injectable non-insulin antidiabetic drugs: Secondary | ICD-10-CM | POA: Diagnosis not present

## 2024-01-18 DIAGNOSIS — E785 Hyperlipidemia, unspecified: Secondary | ICD-10-CM

## 2024-01-18 DIAGNOSIS — Z7984 Long term (current) use of oral hypoglycemic drugs: Secondary | ICD-10-CM | POA: Diagnosis not present

## 2024-01-18 LAB — LIPID PANEL
Cholesterol: 136 mg/dL (ref 0–200)
HDL: 34.7 mg/dL — ABNORMAL LOW (ref >39.00)
LDL Cholesterol: 38 mg/dL (ref 0–99)
NonHDL: 101.76
Total CHOL/HDL Ratio: 4
Triglycerides: 319 mg/dL — ABNORMAL HIGH (ref 0.0–149.0)
VLDL: 63.8 mg/dL — ABNORMAL HIGH (ref 0.0–40.0)

## 2024-01-18 LAB — COMPREHENSIVE METABOLIC PANEL
ALT: 14 U/L (ref 0–35)
AST: 21 U/L (ref 0–37)
Albumin: 4.1 g/dL (ref 3.5–5.2)
Alkaline Phosphatase: 30 U/L — ABNORMAL LOW (ref 39–117)
BUN: 15 mg/dL (ref 6–23)
CO2: 27 meq/L (ref 19–32)
Calcium: 9.3 mg/dL (ref 8.4–10.5)
Chloride: 102 meq/L (ref 96–112)
Creatinine, Ser: 0.71 mg/dL (ref 0.40–1.20)
GFR: 81.89 mL/min (ref >60.00)
Glucose, Bld: 135 mg/dL — ABNORMAL HIGH (ref 70–99)
Potassium: 4 meq/L (ref 3.5–5.1)
Sodium: 139 meq/L (ref 135–145)
Total Bilirubin: 0.5 mg/dL (ref 0.2–1.2)
Total Protein: 6.7 g/dL (ref 6.0–8.3)

## 2024-01-18 LAB — POCT GLYCOSYLATED HEMOGLOBIN (HGB A1C): Hemoglobin A1C: 7.2 % — AB (ref 4.0–5.6)

## 2024-01-18 LAB — MICROALBUMIN / CREATININE URINE RATIO
Creatinine,U: 61.7 mg/dL
Microalb Creat Ratio: 11.3 mg/g (ref 0.0–30.0)
Microalb, Ur: <0.7 mg/dL (ref 0.0–1.9)

## 2024-01-18 NOTE — Patient Instructions (Addendum)
 Continue with annual eye exam  Get back on Ozempic regularly.
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
## 2024-02-13 ENCOUNTER — Ambulatory Visit: Admitting: Family Medicine

## 2024-02-13 ENCOUNTER — Other Ambulatory Visit: Payer: Medicare HMO

## 2024-02-17 ENCOUNTER — Ambulatory Visit: Admitting: Family Medicine

## 2024-03-02 ENCOUNTER — Other Ambulatory Visit: Payer: Self-pay | Admitting: Family Medicine

## 2024-03-14 ENCOUNTER — Other Ambulatory Visit: Payer: Self-pay | Admitting: Family Medicine

## 2024-03-22 ENCOUNTER — Telehealth: Payer: Self-pay | Admitting: Family Medicine

## 2024-03-22 ENCOUNTER — Telehealth: Payer: Self-pay | Admitting: *Deleted

## 2024-03-22 NOTE — Telephone Encounter (Signed)
 Copied from CRM [id number]. Topic: Clinical - Medication Question >> Mar 22, 2024  2:16 PM Shardie S wrote: Reason for CRM: Patient is calling to check if her medication, insulin  glargine, 2 Unit Dial, (TOUJEO  MAX SOLOSTAR) 300 UNIT/ML Solostar Pen, has arrived and is ready for pickup. Patient request a callback.

## 2024-03-22 NOTE — Telephone Encounter (Signed)
 Patient came by- Ozempic  was here--pt states she no longer takes the Ozempic .

## 2024-03-22 NOTE — Telephone Encounter (Signed)
 Patient has Ozempic  in the back fridge--Pt states she no longer takes Ozempic , to please let someone know.  She is waiting for her Toujeo  to arrive.

## 2024-03-22 NOTE — Telephone Encounter (Signed)
 Contacted [employer] patient assistance to follow up on next Toujeo  shipment. Next shipment is due to go out 04/07/24. Attempted to contact patient to update her and had to leave a voicemail.

## 2024-03-22 NOTE — Telephone Encounter (Signed)
 Patient aware medication from assistance program was received.

## 2024-04-04 ENCOUNTER — Telehealth: Payer: Self-pay

## 2024-04-04 NOTE — Progress Notes (Signed)
   04/04/2024  Patient ID: Mary Caldwell, female   DOB: 10-Mar-1946, 78 y.o.   MRN: [id number]  Attempted to contact patient to reschedule missed follow up and notify Ozempic  is ready for pickup. Left HIPAA compliant message for patient to return my call at their convenience.   Carnell Christian, PharmD Clinical Pharmacist [phone]

## 2024-04-06 ENCOUNTER — Other Ambulatory Visit: Payer: Self-pay | Admitting: Family Medicine

## 2024-04-25 ENCOUNTER — Telehealth: Payer: Self-pay | Admitting: Family Medicine

## 2024-04-25 MED ORDER — HYDROCODONE-ACETAMINOPHEN 5-325 MG PO TABS: 1.0000 | ORAL_TABLET | Freq: Four times a day (QID) | ORAL | 0 refills | Status: DC | PRN

## 2024-04-25 NOTE — Telephone Encounter (Signed)
 Prescription Request  04/25/2024  LOV: 01/18/2024  What is the name of the medication or equipment?  HYDROcodone -acetaminophen  (NORCO/VICODIN) 5-325 MG tablet   Have you contacted your pharmacy to request a refill? No   Which pharmacy would you like this sent to?  [facility] [address], [place] - [address] [place] [address] Phone: [phone] Fax: [phone]     Patient notified that their request is being sent to the clinical staff for review and that they should receive a response within 2 business days.   Please advise at Mobile [phone] (mobile)

## 2024-05-16 ENCOUNTER — Other Ambulatory Visit (INDEPENDENT_AMBULATORY_CARE_PROVIDER_SITE_OTHER)

## 2024-05-16 DIAGNOSIS — E1140 Type 2 diabetes mellitus with diabetic neuropathy, unspecified: Secondary | ICD-10-CM

## 2024-05-16 NOTE — Progress Notes (Signed)
 05/16/2024 Name: Mary Caldwell MRN: [id number] DOB: 1946/01/28  Chief Complaint  Patient presents with   Medication Management   Diabetes    Mary Caldwell is a 78 y.o. year old female who presented for a telephone visit.   They were referred to the pharmacist by their PCP for assistance in managing diabetes.    Subjective:  Care Team: Primary Care Provider: Micheal Wolm ORN, MD   Medication Access/Adherence  Current Pharmacy:  [facility] [address] [place] - [address] [place] [address] Phone: [phone] Fax: [phone]  [id number] [place], [place] - [address] [place] [address] Phone: [phone] Fax: [phone]   Patient reports affordability concerns with their medications: No   Patient reports access/transportation concerns to their pharmacy: No  Patient reports adherence concerns with their medications:  No     Diabetes:  Current medications: Jardiance  10mg , Toujeo  42 units with breakfast, Metformin  500mg  2 tabs BID, Ozempic  0.5mg  (stopped using didn't want to be on a weight loss drug Medications tried in the past: Amaryl , Invokana   Current glucose readings: checking in the morning, fasting - 130 today, denies any lows and reports some on occasion being above 150 if she had snacks the night before. Not checking post-prandial  Observed patterns:  Patient denies hypoglycemic s/sx including dizziness, shakiness, sweating. Patient denies hyperglycemic symptoms including polyuria, polydipsia, polyphagia, nocturia, neuropathy, blurred vision.   Current medication access support: Ozempic  through Nov[employer], Toujeo  through [employer], Jardiance  through [facility]   Objective:  Lab Results  Component Value Date   HGBA1C 7.2 (A) 01/18/2024    Lab Results  Component Value Date   CREATININE 0.71 01/18/2024   BUN 15 01/18/2024   NA 139 01/18/2024   K 4.0 01/18/2024   CL 102 01/18/2024    CO2 27 01/18/2024    Lab Results  Component Value Date   CHOL 136 01/18/2024   HDL 34.70 (L) 01/18/2024   LDLCALC 38 01/18/2024   LDLDIRECT 47.0 01/11/2023   TRIG 319.0 (H) 01/18/2024   CHOLHDL 4 01/18/2024    Medications Reviewed Today     Reviewed by Lionell Jon DEL, RPH (Pharmacist) on 05/16/24 at 1314  Med List Status: <None>   Medication Order Taking? Sig Documenting Provider Last Dose Status Informant  dicyclomine  (BENTYL ) 20 MG tablet [id number]  Take 0.5 tablets (10 mg total) by mouth 2 (two) times daily as needed for spasms. Elnor Jayson LABOR, DO  Active            Med Note JANEAN JON DEL Pablo Dec 05, 2023  2:32 PM) Has some on hand prn  empagliflozin  (JARDIANCE ) 10 MG TABS tablet [id number] Yes TAKE 1 TABLET BY MOUTH ONCE DAILY . APPOINTMENT REQUIRED FOR FUTURE REFILLS Micheal Wolm ORN, MD  Active   fenofibrate  160 MG tablet [id number] Yes Take 1 tablet by mouth once daily Burchette, Wolm ORN, MD  Active   HYDROcodone -acetaminophen  (NORCO/VICODIN) 5-325 MG tablet [id number] Yes Take 1 tablet by mouth every 6 (six) hours as needed for moderate pain (pain score 4-6). Burchette, Wolm ORN, MD  Active   insulin  glargine, 2 Unit Dial, (TOUJEO  MAX SOLOSTAR) 300 UNIT/ML Solostar Pen [id number]  INJECT 20 UNITS SUBCUTANEOUSLY ONCE DAILY AT BEDTIME  Patient taking differently: INJECT 40 UNITS SUBCUTANEOUSLY ONCE DAILY AT BEDTIME   Burchette, Wolm ORN, MD  Active            Med Note (  LIONELL JON DEL   Mon Dec 05, 2023  2:34 PM) 42 units in the morning  metFORMIN  (GLUCOPHAGE ) 500 MG tablet [id number] Yes TAKE 2 TABLETS BY MOUTH TWICE DAILY WITH FOOD Burchette, Wolm ORN, MD  Active   oxyCODONE  (ROXICODONE ) 5 MG immediate release tablet [id number]  Take 0.5-1 tablets (2.5-5 mg total) by mouth every 4 (four) hours as needed. Elnor Jayson LABOR, DO  Active            Med Note EPPIE, [facility]   Wed Dec 28, 2023  2:38 PM)    rosuvastatin  (CRESTOR ) 20 MG tablet [id number] Yes TAKE 1 TABLET BY MOUTH  AT BEDTIME Burchette, Wolm ORN, MD  Active   Semaglutide ,0.25 or 0.5MG /DOS, (OZEMPIC , 0.25 OR 0.5 MG/DOSE,) 2 MG/1.5ML SOPN [id number]  Inject 0.25 mg into the skin once a week. [provider]  Active            Med Note JANEAN JON DEL Pablo Dec 05, 2023  2:37 PM) Wednesdays              Assessment/Plan:   Diabetes: - Currently uncontrolled - Reviewed long term cardiovascular and renal outcomes of uncontrolled blood sugar - Reviewed goal A1c, goal fasting, and goal 2 hour post prandial glucose - Reviewed dietary modifications including low carb diet - Recommend to resume Ozempic  once weekly as previously instructed by PCP. Patient will resume today. - Patient denies personal or family history of multiple endocrine neoplasia type 2, medullary thyroid  cancer; personal history of pancreatitis or gallbladder disease. - Recommend to check glucose once daily, at varying times of day     Follow Up Plan: 3 weeks   JON DEL LIONELL, PharmD Clinical Pharmacist [phone]

## 2024-06-06 ENCOUNTER — Other Ambulatory Visit (INDEPENDENT_AMBULATORY_CARE_PROVIDER_SITE_OTHER): Payer: Self-pay

## 2024-06-06 DIAGNOSIS — E1140 Type 2 diabetes mellitus with diabetic neuropathy, unspecified: Secondary | ICD-10-CM

## 2024-06-06 NOTE — Progress Notes (Signed)
 06/06/2024 Name: Mary Caldwell MRN: [id number] DOB: 1946/08/17  Chief Complaint  Patient presents with   Medication Management   Diabetes    Mary Caldwell is a 78 y.o. year old female who presented for a telephone visit.   They were referred to the pharmacist by their PCP for assistance in managing diabetes.    Subjective:  Care Team: Primary Care Provider: Micheal Wolm ORN, MD   Medication Access/Adherence  Current Pharmacy:  [facility] [address] [place] - [address] [place] [address] Phone: [phone] Fax: [phone]  [id number] - [place], [place] - [address] [place] [address] Phone: [phone] Fax: [phone]   Patient reports affordability concerns with their medications: No   Patient reports access/transportation concerns to their pharmacy: No  Patient reports adherence concerns with their medications:  No     Diabetes:  Current medications: Jardiance  10mg , Toujeo  42 units with breakfast, Metformin  500mg  2 tabs BID, Ozempic  0.5mg  (restarted 3 weeks ago) Medications tried in the past: Amaryl , Invokana   Current glucose readings: checking in the morning, fasting - 130 today, denies any lows and reports some on occasion being above 150 if she had snacks the night before. Not checking post-prandial  Observed patterns:  Patient denies hypoglycemic s/sx including dizziness, shakiness, sweating. Patient denies hyperglycemic symptoms including polyuria, polydipsia, polyphagia, nocturia, neuropathy, blurred vision.   Current medication access support: Ozempic  through Nov[employer], Toujeo  through [employer], Jardiance  through [facility]   Objective:  Lab Results  Component Value Date   HGBA1C 7.2 (A) 01/18/2024    Lab Results  Component Value Date   CREATININE 0.71 01/18/2024   BUN 15 01/18/2024   NA 139 01/18/2024   K 4.0 01/18/2024   CL 102 01/18/2024   CO2 27 01/18/2024    Lab  Results  Component Value Date   CHOL 136 01/18/2024   HDL 34.70 (L) 01/18/2024   LDLCALC 38 01/18/2024   LDLDIRECT 47.0 01/11/2023   TRIG 319.0 (H) 01/18/2024   CHOLHDL 4 01/18/2024    Medications Reviewed Today     Reviewed by Lionell Jon DEL, RPH (Pharmacist) on 06/06/24 at 1219  Med List Status: <None>   Medication Order Taking? Sig Documenting Provider Last Dose Status Informant  dicyclomine  (BENTYL ) 20 MG tablet [id number] Yes Take 0.5 tablets (10 mg total) by mouth 2 (two) times daily as needed for spasms. Elnor Jayson LABOR, DO  Active            Med Note JANEAN JON DEL Pablo Dec 05, 2023  2:32 PM) Has some on hand prn  empagliflozin  (JARDIANCE ) 10 MG TABS tablet [id number] Yes TAKE 1 TABLET BY MOUTH ONCE DAILY . APPOINTMENT REQUIRED FOR FUTURE REFILLS Micheal Wolm ORN, MD  Active   fenofibrate  160 MG tablet [id number] Yes Take 1 tablet by mouth once daily Burchette, Wolm ORN, MD  Active   HYDROcodone -acetaminophen  (NORCO/VICODIN) 5-325 MG tablet [id number] Yes Take 1 tablet by mouth every 6 (six) hours as needed for moderate pain (pain score 4-6). Micheal Wolm ORN, MD  Active   insulin  glargine, 2 Unit Dial, (TOUJEO  MAX SOLOSTAR) 300 UNIT/ML Solostar Pen [id number]  INJECT 20 UNITS SUBCUTANEOUSLY ONCE DAILY AT BEDTIME  Patient taking differently: INJECT 40 UNITS SUBCUTANEOUSLY ONCE DAILY AT BEDTIME   Burchette, Wolm ORN, MD  Active            Med Note JANEAN JON DEL Pablo Dec 05, 2023  2:34 PM) 42 units in the morning  metFORMIN  (GLUCOPHAGE ) 500 MG tablet [id number] Yes TAKE 2 TABLETS BY MOUTH TWICE DAILY WITH FOOD Burchette, Wolm ORN, MD  Active   oxyCODONE  (ROXICODONE ) 5 MG immediate release tablet [id number] Yes Take 0.5-1 tablets (2.5-5 mg total) by mouth every 4 (four) hours as needed. Elnor Jayson LABOR, DO  Active            Med Note (MOYUN, [facility]   Wed Dec 28, 2023  2:38 PM)    rosuvastatin  (CRESTOR ) 20 MG tablet [id number] Yes TAKE 1 TABLET BY MOUTH AT BEDTIME Burchette,  Wolm ORN, MD  Active   Semaglutide ,0.25 or 0.5MG /DOS, (OZEMPIC , 0.25 OR 0.5 MG/DOSE,) 2 MG/1.5ML SOPN [id number] Yes Inject 0.5 mg into the skin once a week. On Wednesdays [provider]  Active            Med Note JANEAN, JON DEL   Wed Jun 06, 2024 12:19 PM)                Assessment/Plan:   Diabetes: - Currently uncontrolled - Reviewed long term cardiovascular and renal outcomes of uncontrolled blood sugar - Reviewed goal A1c, goal fasting, and goal 2 hour post prandial glucose - Reviewed dietary modifications including low carb diet -Continue current medication therapy. Reminded patient to schedule follow up with PCP , she declines to schedule today, will call back. Consider ozempic  increase to 1mg  at next follow up. - Patient denies personal or family history of multiple endocrine neoplasia type 2, medullary thyroid  cancer; personal history of pancreatitis or gallbladder disease. - Recommend to check glucose once daily, at varying times of day     Follow Up Plan: 1 month   JON DEL [place], PharmD Clinical Pharmacist [phone]

## 2024-06-09 ENCOUNTER — Other Ambulatory Visit: Payer: Self-pay | Admitting: Family Medicine

## 2024-06-20 ENCOUNTER — Ambulatory Visit (INDEPENDENT_AMBULATORY_CARE_PROVIDER_SITE_OTHER)

## 2024-06-20 VITALS — Ht 60.0 in | Wt 158.0 lb

## 2024-06-20 DIAGNOSIS — Z0000 Encounter for general adult medical examination without abnormal findings: Secondary | ICD-10-CM | POA: Diagnosis not present
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
## 2024-06-20 NOTE — Patient Instructions (Signed)
 Ms. Cariker , Thank you for taking time out of your busy schedule to complete your Annual Wellness Visit with me. I enjoyed our conversation and look forward to speaking with you again next year. I, as well as your care team,  appreciate your ongoing commitment to your health goals. Please review the following plan we discussed and let me know if I can assist you in the future. Your Game plan/ To Do List    Referrals: If you haven't heard from the office you've been referred to, please reach out to them at the phone provided.   Follow up Visits: Next Medicare AWV with our clinical staff: 06/26/25 @ 9:20a   Have you seen your provider in the last 6 months (3 months if uncontrolled diabetes)? 01/18/24 Next Office Visit with your provider:   Clinician Recommendations:  Aim for 30 minutes of exercise or brisk walking, 6-8 glasses of water, and 5 servings of fruits and vegetables each day.       This is a list of the screening recommended for you and due dates:  Health Maintenance  Topic Date Due   Hepatitis C Screening  Never done   Zoster (Shingles) Vaccine (1 of 2) Never done   DEXA scan (bone density measurement)  Never done   COVID-19 Vaccine (3 - Pfizer risk series) 09/22/2020   Colon Cancer Screening  03/23/2021   Eye exam for diabetics  07/28/2023   Complete foot exam   10/12/2023   Flu Shot  06/22/2024   Hemoglobin A1C  07/17/2024   Yearly kidney function blood test for diabetes  01/17/2025   Yearly kidney health urinalysis for diabetes  01/17/2025   Medicare Annual Wellness Visit  06/20/2025   DTaP/Tdap/Td vaccine (3 - Td or Tdap) 03/21/2031   Pneumococcal Vaccine for age over 6  Completed   Hepatitis B Vaccine  Aged Out   HPV Vaccine  Aged Out   Meningitis B Vaccine  Aged Out    Advanced directives: (Copy Requested) Please bring a copy of your health care power of attorney and living will to the office to be added to your chart at your convenience. You can mail to [facility] [address] [place], [place] [address] or email to [email or web address] Advance Care Planning is important because it:  [x]  Makes sure you receive the medical care that is consistent with your values, goals, and preferences  [x]  It provides guidance to your family and loved ones and reduces their decisional burden about whether or not they are making the right decisions based on your wishes.  Follow the link provided in your after visit summary or read over the paperwork we have mailed to you to help you started getting your Advance Directives in place. If you need assistance in completing these, please reach out to us  so that we can help you!  See attachments for Preventive Care and Fall Prevention Tips.

## 2024-07-04 ENCOUNTER — Telehealth: Payer: Self-pay

## 2024-07-04 ENCOUNTER — Other Ambulatory Visit

## 2024-07-04 DIAGNOSIS — E1140 Type 2 diabetes mellitus with diabetic neuropathy, unspecified: Secondary | ICD-10-CM

## 2024-07-04 NOTE — Progress Notes (Signed)
 07/04/2024 Name: Mary Caldwell MRN: [id number] DOB: August 23, 1946  Chief Complaint  Patient presents with   Medication Management   Diabetes    Mary Caldwell is a 78 y.o. year old female who presented for a telephone visit.   They were referred to the pharmacist by their PCP for assistance in managing diabetes.    Subjective:  Care Team: Primary Care Provider: Micheal Wolm ORN, MD   Medication Access/Adherence  Current Pharmacy:  [facility] [address], [place] - [address] [place] [address] Phone: [phone] Fax: [phone]  [id number] - [place], [place] - [address] [place] [address] Phone: [phone] Fax: [phone]   Patient reports affordability concerns with their medications: No   Patient reports access/transportation concerns to their pharmacy: No  Patient reports adherence concerns with their medications:  No     Diabetes:  Current medications: Jardiance  10mg , Toujeo  42 units with breakfast, Metformin  500mg  2 tabs BID, Ozempic  0.5mg  (restarted 3 weeks ago) - Self STOPPED again since last visit. Fearful of losing too much weight, reports she does not want to drop below 130lbs Medications tried in the past: Amaryl , Invokana   Current glucose readings: Driving and cannot report readings at time of call  Observed patterns:  Patient denies hypoglycemic s/sx including dizziness, shakiness, sweating. Patient denies hyperglycemic symptoms including polyuria, polydipsia, polyphagia, nocturia, neuropathy, blurred vision.   Current medication access support: Ozempic  through [employer], Toujeo  through [employer], Jardiance  through [facility]   Objective:  Lab Results  Component Value Date   HGBA1C 7.2 (A) 01/18/2024    Lab Results  Component Value Date   CREATININE 0.71 01/18/2024   BUN 15 01/18/2024   NA 139 01/18/2024   K 4.0 01/18/2024   CL 102 01/18/2024   CO2 27 01/18/2024    Lab  Results  Component Value Date   CHOL 136 01/18/2024   HDL 34.70 (L) 01/18/2024   LDLCALC 38 01/18/2024   LDLDIRECT 47.0 01/11/2023   TRIG 319.0 (H) 01/18/2024   CHOLHDL 4 01/18/2024    Medications Reviewed Today     Reviewed by Lionell Jon DEL, RPH (Pharmacist) on 07/04/24 at 1353  Med List Status: <None>   Medication Order Taking? Sig Documenting Provider Last Dose Status Informant  dicyclomine  (BENTYL ) 20 MG tablet [id number]  Take 0.5 tablets (10 mg total) by mouth 2 (two) times daily as needed for spasms. Elnor Jayson LABOR, DO  Active            Med Note JANEAN JON DEL Pablo Dec 05, 2023  2:32 PM) Has some on hand prn  empagliflozin  (JARDIANCE ) 10 MG TABS tablet [id number]  TAKE 1 TABLET BY MOUTH ONCE DAILY . APPOINTMENT REQUIRED FOR FUTURE REFILLS Micheal Wolm ORN, MD  Active   fenofibrate  160 MG tablet [id number]  Take 1 tablet by mouth once daily Burchette, Wolm ORN, MD  Active   HYDROcodone -acetaminophen  (NORCO/VICODIN) 5-325 MG tablet [id number]  Take 1 tablet by mouth every 6 (six) hours as needed for moderate pain (pain score 4-6). Burchette, Wolm ORN, MD  Active   insulin  glargine, 2 Unit Dial, (TOUJEO  MAX SOLOSTAR) 300 UNIT/ML Solostar Pen [id number] No INJECT 20 UNITS SUBCUTANEOUSLY ONCE DAILY AT BEDTIME  Patient taking differently: INJECT 40 UNITS SUBCUTANEOUSLY ONCE DAILY AT BEDTIME   Burchette, Wolm ORN, MD Taking Active            Med Note (Jezreel Sisk H  Mon Dec 05, 2023  2:34 PM) 42 units in the morning  metFORMIN  (GLUCOPHAGE ) 500 MG tablet [id number]  TAKE 2 TABLETS BY MOUTH TWICE DAILY WITH FOOD Burchette, Wolm ORN, MD  Active   oxyCODONE  (ROXICODONE ) 5 MG immediate release tablet [id number]  Take 0.5-1 tablets (2.5-5 mg total) by mouth every 4 (four) hours as needed. Elnor Jayson LABOR, DO  Active            Med Note EPPIE, [facility]   Wed Dec 28, 2023  2:38 PM)    rosuvastatin  (CRESTOR ) 20 MG tablet [id number]  TAKE 1 TABLET BY MOUTH AT BEDTIME Burchette, Wolm ORN, MD   Active   Semaglutide ,0.25 or 0.5MG /DOS, (OZEMPIC , 0.25 OR 0.5 MG/DOSE,) 2 MG/1.5ML SOPN [id number]  Inject 0.5 mg into the skin once a week. On Wednesdays [provider]  Active            Med Note JANEAN, JON DEL   Wed Jun 06, 2024 12:19 PM)                Assessment/Plan:   Diabetes: - Currently uncontrolled - Reviewed long term cardiovascular and renal outcomes of uncontrolled blood sugar - Reviewed goal A1c, goal fasting, and goal 2 hour post prandial glucose - Reviewed dietary modifications including low carb diet -Counseled on average weight loss with ozempic  is typically under 25lbs. Counseled that any weight loss with ozempic  typically returns unless patient intentionally follows strict diet/exercise to keep weight off. Confirmed patient has a home scale to monitor weight at least once weekly. Patient agrees to restart Ozempic  0.5mg  and monitor weight. -Past due for follow up with PCP, patient reports she will call back to schedule when not driving - Patient denies personal or family history of multiple endocrine neoplasia type 2, medullary thyroid  cancer; personal history of pancreatitis or gallbladder disease. - Recommend to check glucose once daily, at varying times of day     Follow Up Plan: 1 month   JON DEL Lindau, PharmD Clinical Pharmacist [phone]

## 2024-07-04 NOTE — Progress Notes (Signed)
   07/04/2024  Patient ID: Mary Caldwell, female   DOB: 1946-09-08, 78 y.o.   MRN: [id number]  Attempted to contact patient for scheduled appointment for medication management. Left HIPAA compliant message for patient to return my call at their convenience.   [name], PharmD Clinical Pharmacist [phone]

## 2024-07-04 NOTE — Telephone Encounter (Signed)
 Returned patient's phone call, see med mgmt encounter for documentation.

## 2024-07-04 NOTE — Telephone Encounter (Signed)
 Copied from CRM #[id number]. Topic: General - Other >> Jul 04, 2024 12:16 PM Lavanda D wrote: Reason for CRM: Patient is returning a call from [facility] for medication management, please call back when available.

## 2024-07-12 ENCOUNTER — Other Ambulatory Visit: Payer: Self-pay | Admitting: Family Medicine

## 2024-07-19 ENCOUNTER — Telehealth: Payer: Self-pay

## 2024-07-19 NOTE — Telephone Encounter (Signed)
 Spoke with patient regarding the delivery of her Toujeo . Advised patient to pick up at her convenience. Patient voiced understanding.

## 2024-07-26 ENCOUNTER — Telehealth: Payer: Self-pay

## 2024-07-26 NOTE — Telephone Encounter (Signed)
 Please disregard

## 2024-08-01 ENCOUNTER — Ambulatory Visit: Admitting: Family Medicine

## 2024-08-01 ENCOUNTER — Other Ambulatory Visit

## 2024-08-01 ENCOUNTER — Encounter: Payer: Self-pay | Admitting: Family Medicine

## 2024-08-01 VITALS — BP 134/74 | HR 71 | Temp 97.8°F | Wt 160.0 lb

## 2024-08-01 DIAGNOSIS — E785 Hyperlipidemia, unspecified: Secondary | ICD-10-CM

## 2024-08-01 DIAGNOSIS — H6123 Impacted cerumen, bilateral: Secondary | ICD-10-CM | POA: Diagnosis not present

## 2024-08-01 DIAGNOSIS — Z7985 Long-term (current) use of injectable non-insulin antidiabetic drugs: Secondary | ICD-10-CM | POA: Diagnosis not present

## 2024-08-01 DIAGNOSIS — Z7984 Long term (current) use of oral hypoglycemic drugs: Secondary | ICD-10-CM

## 2024-08-01 DIAGNOSIS — Z23 Encounter for immunization: Secondary | ICD-10-CM | POA: Diagnosis not present

## 2024-08-01 DIAGNOSIS — E1140 Type 2 diabetes mellitus with diabetic neuropathy, unspecified: Secondary | ICD-10-CM | POA: Diagnosis not present

## 2024-08-01 DIAGNOSIS — I10 Essential (primary) hypertension: Secondary | ICD-10-CM | POA: Diagnosis not present

## 2024-08-01 DIAGNOSIS — Z794 Long term (current) use of insulin: Secondary | ICD-10-CM | POA: Diagnosis not present

## 2024-08-01 LAB — POCT GLYCOSYLATED HEMOGLOBIN (HGB A1C): Hemoglobin A1C: 7.4 % — AB (ref 4.0–5.6)

## 2024-08-01 NOTE — Patient Instructions (Signed)
 Try to get back on the Ozempic  weekly  A1C today 7.4%
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
## 2024-08-06 ENCOUNTER — Encounter: Payer: Self-pay | Admitting: Family Medicine

## 2024-08-06 ENCOUNTER — Other Ambulatory Visit

## 2024-08-06 ENCOUNTER — Ambulatory Visit (INDEPENDENT_AMBULATORY_CARE_PROVIDER_SITE_OTHER): Admitting: Family Medicine

## 2024-08-06 VITALS — BP 128/72 | HR 94 | Temp 97.3°F | Ht 60.0 in | Wt 160.8 lb

## 2024-08-06 DIAGNOSIS — R1032 Left lower quadrant pain: Secondary | ICD-10-CM | POA: Diagnosis not present

## 2024-08-06 DIAGNOSIS — Z8719 Personal history of other diseases of the digestive system: Secondary | ICD-10-CM

## 2024-08-06 LAB — CBC WITH DIFFERENTIAL/PLATELET
Basophils Absolute: 0 K/uL (ref 0.0–0.1)
Basophils Relative: 0.6 % (ref 0.0–3.0)
Eosinophils Absolute: 0.1 K/uL (ref 0.0–0.7)
Eosinophils Relative: 1.9 % (ref 0.0–5.0)
HCT: 39.4 % (ref 36.0–46.0)
Hemoglobin: 13.5 g/dL (ref 12.0–15.0)
Lymphocytes Relative: 28.2 % (ref 12.0–46.0)
Lymphs Abs: 2.1 K/uL (ref 0.7–4.0)
MCHC: 34.3 g/dL (ref 30.0–36.0)
MCV: 88.8 fl (ref 78.0–100.0)
Monocytes Absolute: 0.5 K/uL (ref 0.1–1.0)
Monocytes Relative: 6.1 % (ref 3.0–12.0)
Neutro Abs: 4.7 K/uL (ref 1.4–7.7)
Neutrophils Relative %: 63.2 % (ref 43.0–77.0)
Platelets: 276 K/uL (ref 150.0–400.0)
RBC: 4.43 Mil/uL (ref 3.87–5.11)
RDW: 13.3 % (ref 11.5–15.5)
WBC: 7.5 K/uL (ref 4.0–10.5)

## 2024-08-06 LAB — COMPREHENSIVE METABOLIC PANEL WITH GFR
ALT: 10 U/L (ref 0–35)
AST: 14 U/L (ref 0–37)
Albumin: 4.2 g/dL (ref 3.5–5.2)
Alkaline Phosphatase: 31 U/L — ABNORMAL LOW (ref 39–117)
BUN: 14 mg/dL (ref 6–23)
CO2: 27 meq/L (ref 19–32)
Calcium: 9.9 mg/dL (ref 8.4–10.5)
Chloride: 99 meq/L (ref 96–112)
Creatinine, Ser: 0.7 mg/dL (ref 0.40–1.20)
GFR: 82.97 mL/min (ref >60.00)
Glucose, Bld: 142 mg/dL — ABNORMAL HIGH (ref 70–99)
Potassium: 3.8 meq/L (ref 3.5–5.1)
Sodium: 137 meq/L (ref 135–145)
Total Bilirubin: 0.6 mg/dL (ref 0.2–1.2)
Total Protein: 6.9 g/dL (ref 6.0–8.3)

## 2024-08-06 LAB — POC URINALSYSI DIPSTICK (AUTOMATED)
Bilirubin, UA: NEGATIVE
Blood, UA: NEGATIVE
Glucose, UA: POSITIVE — AB
Ketones, UA: NEGATIVE
Leukocytes, UA: NEGATIVE
Nitrite, UA: NEGATIVE
Protein, UA: NEGATIVE
Spec Grav, UA: 1.02 (ref 1.010–1.025)
Urobilinogen, UA: 0.2 U/dL
pH, UA: 5.5 (ref 5.0–8.0)

## 2024-08-06 MED ORDER — AMOXICILLIN-POT CLAVULANATE 875-125 MG PO TABS: 1.0000 | ORAL_TABLET | Freq: Two times a day (BID) | ORAL | 0 refills | Status: DC
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
## 2024-08-20 ENCOUNTER — Other Ambulatory Visit

## 2024-08-20 ENCOUNTER — Ambulatory Visit: Payer: Self-pay | Admitting: Family Medicine

## 2024-09-10 ENCOUNTER — Other Ambulatory Visit: Payer: Self-pay | Admitting: Family Medicine

## 2024-09-10 ENCOUNTER — Other Ambulatory Visit

## 2024-09-10 DIAGNOSIS — E1140 Type 2 diabetes mellitus with diabetic neuropathy, unspecified: Secondary | ICD-10-CM

## 2024-09-10 DIAGNOSIS — Z7984 Long term (current) use of oral hypoglycemic drugs: Secondary | ICD-10-CM

## 2024-09-10 DIAGNOSIS — Z7985 Long-term (current) use of injectable non-insulin antidiabetic drugs: Secondary | ICD-10-CM

## 2024-09-10 NOTE — Progress Notes (Signed)
 09/10/2024 Name: Mary Caldwell MRN: [id number] DOB: [birth year]  Chief Complaint  Patient presents with   Medication Management   Diabetes   Medication Assistance    Mary Caldwell is a 78 y.o. year old female who presented for a telephone visit.   They were referred to the pharmacist by their PCP for assistance in managing diabetes.    Subjective:  Care Team: Primary Care Provider: Micheal Wolm ORN, MD   Medication Access/Adherence  Current Pharmacy:  [facility] [address], [place] - [address] [place] [address] Phone: [phone] Fax: [phone]  [id number] - [place], [place] - [address] [place] [address] Phone: [phone] Fax: [phone]  [facility] - [place], [place] - [address] [place] [address] Phone: [phone] Fax: [phone]   Patient reports affordability concerns with their medications: No   Patient reports access/transportation concerns to their pharmacy: No  Patient reports adherence concerns with their medications:  No     Diabetes:  Current medications: Jardiance  10mg , Toujeo  42 units with breakfast, Metformin  500mg  2 tabs BID, Ozempic  0.5mg  - self stopped again, admits been very inconsistent about taking Medications tried in the past: Amaryl , Invokana   Current glucose readings: reports checking once daily fasting, around 130, sometimes in 140s  Observed patterns:  Patient denies hypoglycemic s/sx including dizziness, shakiness, sweating. Patient denies hyperglycemic symptoms including polyuria, polydipsia, polyphagia, nocturia, neuropathy, blurred vision.   Current medication access support: Ozempic  through [employer], Toujeo  through [employer], Jardiance  through [facility]   Objective:  Lab Results  Component Value Date   HGBA1C 7.4 (A) 08/01/2024    Lab Results  Component Value Date   CREATININE 0.70 08/06/2024   BUN 14 08/06/2024    NA 137 08/06/2024   K 3.8 08/06/2024   CL 99 08/06/2024   CO2 27 08/06/2024    Lab Results  Component Value Date   CHOL 136 01/18/2024   HDL 34.70 (L) 01/18/2024   LDLCALC 38 01/18/2024   LDLDIRECT 47.0 01/11/2023   TRIG 319.0 (H) 01/18/2024   CHOLHDL 4 01/18/2024    Medications Reviewed Today     Reviewed by Lionell Jon DEL, RPH (Pharmacist) on 09/10/24 at 1542  Med List Status: <None>   Medication Order Taking? Sig Documenting Provider Last Dose Status Informant  amoxicillin -clavulanate (AUGMENTIN ) 875-125 MG tablet [id number]  Take 1 tablet by mouth 2 (two) times daily. Mercer Clotilda JONELLE, MD  Active   dicyclomine  (BENTYL ) 20 MG tablet [id number]  Take 0.5 tablets (10 mg total) by mouth 2 (two) times daily as needed for spasms. Elnor Jayson LABOR, DO  Active            Med Note JANEAN JON DEL Pablo Dec 05, 2023  2:32 PM) Has some on hand prn  fenofibrate  160 MG tablet [id number]  Take 1 tablet by mouth once daily Burchette, Wolm ORN, MD  Active   HYDROcodone -acetaminophen  (NORCO/VICODIN) 5-325 MG tablet [id number]  Take 1 tablet by mouth every 6 (six) hours as needed for moderate pain (pain score 4-6). Burchette, Wolm ORN, MD  Active   insulin  glargine, 2 Unit Dial, (TOUJEO  MAX SOLOSTAR) 300 UNIT/ML Solostar Pen [id number]  INJECT 20 UNITS SUBCUTANEOUSLY ONCE DAILY AT BEDTIME  Patient taking differently: INJECT 40 UNITS SUBCUTANEOUSLY ONCE DAILY AT BEDTIME   Burchette, Wolm ORN, MD  Active            Med Note (Ayse Mccartin  H   Mon Dec 05, 2023  2:34 PM) 42 units in the morning  JARDIANCE  10 MG TABS tablet [id number]  TAKE ONE TABLET BY MOUTH DAILY Burchette, Wolm ORN, MD  Active   metFORMIN  (GLUCOPHAGE ) 500 MG tablet [id number]  TAKE 2 TABLETS BY MOUTH TWICE DAILY WITH FOOD Burchette, Wolm ORN, MD  Active   rosuvastatin  (CRESTOR ) 20 MG tablet [id number]  TAKE 1 TABLET BY MOUTH AT BEDTIME Burchette, Wolm ORN, MD  Active   Semaglutide ,0.25 or 0.5MG /DOS, (OZEMPIC , 0.25 OR 0.5 MG/DOSE,) 2  MG/1.5ML SOPN [id number]  Inject 0.5 mg into the skin once a week. On Wednesdays [provider]  Active            Med Note JANEAN, JON DEL   Wed Jun 06, 2024 12:19 PM)                Assessment/Plan:   Diabetes: - Currently uncontrolled - Reviewed long term cardiovascular and renal outcomes of uncontrolled blood sugar - Reviewed goal A1c, goal fasting, and goal 2 hour post prandial glucose - Reviewed dietary modifications including low carb diet -Continue current medication therapy from list. Notified patient Ozempic  medicare program ending, patient admits not really using, will remove from med list.  -Jardiance  PAP enrollment has started for 2026. Patient prefers to come by office to sign in person, paperwork prepared and waiting patient signature up front. PCP portion completed. Pending submission once patient completes her portion -Future consideration: May need to add bolus insulin  or increase tresiba if A1C/BG remain elevated - Patient denies personal or family history of multiple endocrine neoplasia type 2, medullary thyroid  cancer; personal history of pancreatitis or gallbladder disease. - Recommend to check glucose once daily, at varying times of day     Follow Up Plan: 3 months   JON DEL Lindau, PharmD Clinical Pharmacist [phone]

## 2024-09-17 ENCOUNTER — Telehealth: Payer: Self-pay | Admitting: Family Medicine

## 2024-09-17 MED ORDER — HYDROCODONE-ACETAMINOPHEN 5-325 MG PO TABS: 1.0000 | ORAL_TABLET | Freq: Four times a day (QID) | ORAL | 0 refills | Status: AC | PRN

## 2024-09-17 NOTE — Telephone Encounter (Signed)
 Pt is requesting a refill on Hydrocodone .  Pharmacy-- [facility] on [address].

## 2024-09-18 ENCOUNTER — Other Ambulatory Visit: Payer: Self-pay | Admitting: Family Medicine

## 2024-09-19 ENCOUNTER — Telehealth: Payer: Self-pay

## 2024-09-19 NOTE — Telephone Encounter (Signed)
 Received a letter from [employer] stating pt is missing household income and Ins imf, spoke with representative explain they could not read the faxed pap to re -faxed again.

## 2024-10-01 ENCOUNTER — Telehealth: Payer: Self-pay

## 2024-10-01 NOTE — Progress Notes (Signed)
   10/01/2024  Patient ID: Mary Caldwell, female   DOB: November 01, 1946, 78 y.o.   MRN: [id number]  Received notification from [facility] that they are requesting proof of income for 2025 to continue processing patient's Jardiance  renewal for 2026.  Notified patient, she will bring by the office at her earliest convenience.  Mary Caldwell, PharmD Clinical Pharmacist [phone]

## 2024-10-03 ENCOUNTER — Telehealth: Payer: Self-pay

## 2024-10-03 NOTE — Progress Notes (Signed)
   10/03/2024  Patient ID: Mary Caldwell, female   DOB: 05/10/1946, 78 y.o.   MRN: [id number]  Patient provided proof of income to office to submit with her Jardiance  2026 renewal application. Submitted to company to processing via fax. Confirmation received. Pending decision.  Mary Caldwell, PharmD Clinical Pharmacist [phone]

## 2024-10-08 ENCOUNTER — Ambulatory Visit: Payer: Self-pay

## 2024-10-08 NOTE — Telephone Encounter (Signed)
 FYI Only or Action Required?: FYI only for provider: appointment scheduled on tomorrow morning.  Patient was last seen in primary care on 08/06/2024 by Mercer Clotilda SAUNDERS, MD.  Called Nurse Triage reporting Neck Pain.  Symptoms began yesterday.  Interventions attempted: Nothing.  Symptoms are: unchanged.  Triage Disposition: See PCP When Office is Open (Within 3 Days)  Patient/caregiver understands and will follow disposition?: Yes - will also try some home remedies to relieve pain until seeing provider tomorrow.                   Copied from CRM #[id number]. Topic: Clinical - Red Word Triage >> Oct 08, 2024  4:28 PM Rosina BIRCH wrote: Red Word that prompted transfer to Nurse Triage: neck pain that started yesterday Reason for Disposition  [1] MODERATE neck pain (e.g., interferes with normal activities) AND [2] present > 3 days  Answer Assessment - Initial Assessment Questions 1. ONSET: When did the pain begin?      yesterday 2. LOCATION: Where does it hurt?      Started on right side on back under shoulder blade - now in neck 3. PATTERN Does the pain come and go, or has it been constant since it started?      constant 4. SEVERITY: How bad is the pain?  (Scale 0-10; or none or slight stiffness, mild, moderate, severe)     8/10 5. RADIATION: Does the pain go anywhere else, shoot into your arms?     no 6. CORD SYMPTOMS: Any weakness or numbness of the arms or legs?     no 7. CAUSE: What do you think is causing the neck pain?     no 8. NECK OVERUSE: Any recent activities that involved turning or twisting the neck?     no 9. OTHER SYMPTOMS: Do you have any other symptoms? (e.g., headache, fever, chest pain, difficulty breathing, neck swelling)     General weakness - & fatigue  Protocols used: Neck Pain or Stiffness-A-AH

## 2024-10-09 ENCOUNTER — Ambulatory Visit (INDEPENDENT_AMBULATORY_CARE_PROVIDER_SITE_OTHER): Admitting: Family Medicine

## 2024-10-09 ENCOUNTER — Encounter: Payer: Self-pay | Admitting: Family Medicine

## 2024-10-09 VITALS — BP 146/64 | HR 93 | Temp 97.8°F | Wt 163.9 lb

## 2024-10-09 DIAGNOSIS — M549 Dorsalgia, unspecified: Secondary | ICD-10-CM | POA: Diagnosis not present
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
## 2024-10-09 NOTE — Patient Instructions (Addendum)
 Use some topical heat to upper back  May use the Aleve, but take with food and try to use short term only.    Set up 2 month follow up.

## 2024-10-12 ENCOUNTER — Other Ambulatory Visit: Payer: Self-pay | Admitting: Family Medicine

## 2024-10-12 NOTE — Progress Notes (Signed)
   10/12/2024  Patient ID: Mary Caldwell, female   DOB: 21-Dec-1945, 78 y.o.   MRN: [id number]  Pharmacy Quality Measure Review  This patient is appearing on a report for being at risk of failing the adherence measure for cholesterol (statin) medications this calendar year.   Medication: Rosuvastatin  Last fill date: 09/18/24 for 90 day supply  Insurance report was not up to date. No action needed at this time.   Mary Caldwell, PharmD Clinical Pharmacist [phone]

## 2024-10-23 ENCOUNTER — Telehealth: Payer: Self-pay

## 2024-10-23 LAB — HM DIABETES EYE EXAM

## 2024-10-23 NOTE — Telephone Encounter (Signed)
 Called and left a VM patient's Medication is ready to be picked up

## 2024-10-30 ENCOUNTER — Encounter: Payer: Self-pay | Admitting: Family Medicine

## 2024-11-26 ENCOUNTER — Other Ambulatory Visit (INDEPENDENT_AMBULATORY_CARE_PROVIDER_SITE_OTHER)

## 2024-11-26 ENCOUNTER — Telehealth: Payer: Self-pay

## 2024-11-26 DIAGNOSIS — E1140 Type 2 diabetes mellitus with diabetic neuropathy, unspecified: Secondary | ICD-10-CM

## 2024-11-26 NOTE — Progress Notes (Signed)
 "  11/26/2024 Name: Mary Caldwell MRN: [id number] DOB: Mar 27, 1946  Chief Complaint  Patient presents with   Medication Management   Diabetes    Mary Caldwell is a 79 y.o. year old female who presented for a telephone visit.   They were referred to the pharmacist by their PCP for assistance in managing diabetes.    Subjective:  Care Team: Primary Care Provider: Micheal Wolm ORN, MD   Medication Access/Adherence  Current Pharmacy:  [facility] [address] [place] - [address] [place] [address] Phone: [phone] Fax: [phone]  [id number] - [place], [place] - [address] [place] [address] Phone: [phone] Fax: [phone]  [facility] - [place], [place] - [address] [place] [address] Phone: [phone] Fax: [phone]   Patient reports affordability concerns with their medications: No   Patient reports access/transportation concerns to their pharmacy: No  Patient reports adherence concerns with their medications:  No     Diabetes:  Current medications: Jardiance  10mg , Toujeo  42 units with breakfast, Metformin  500mg  2 tabs BID Medications tried in the past: Amaryl , Invokana   Current glucose readings: reports checking once daily fasting, around 130, sometimes in 140s  Observed patterns:  Patient denies hypoglycemic s/sx including dizziness, shakiness, sweating. Patient denies hyperglycemic symptoms including polyuria, polydipsia, polyphagia, nocturia, neuropathy, blurred vision.   Current medication access support: Toujeo  through [employer], Jardiance  through [facility]   Objective:  Lab Results  Component Value Date   HGBA1C 7.4 (A) 08/01/2024    Lab Results  Component Value Date   CREATININE 0.70 08/06/2024   BUN 14 08/06/2024   NA 137 08/06/2024   K 3.8 08/06/2024   CL 99 08/06/2024   CO2 27 08/06/2024    Lab Results  Component Value Date   CHOL  136 01/18/2024   HDL 34.70 (L) 01/18/2024   LDLCALC 38 01/18/2024   LDLDIRECT 47.0 01/11/2023   TRIG 319.0 (H) 01/18/2024   CHOLHDL 4 01/18/2024    Medications Reviewed Today     Reviewed by Lionell Jon DEL, RPH (Pharmacist) on 11/26/24 at 1520  Med List Status: <None>   Medication Order Taking? Sig Documenting Provider Last Dose Status Informant  amoxicillin -clavulanate (AUGMENTIN ) 875-125 MG tablet [id number]  Take 1 tablet by mouth 2 (two) times daily. Mercer Clotilda JONELLE, MD  Active   dicyclomine  (BENTYL ) 20 MG tablet [id number]  Take 0.5 tablets (10 mg total) by mouth 2 (two) times daily as needed for spasms. Elnor Jayson LABOR, DO  Active            Med Note JANEAN JON DEL Pablo Dec 05, 2023  2:32 PM) Has some on hand prn  fenofibrate  160 MG tablet [id number]  Take 1 tablet by mouth once daily Burchette, Wolm ORN, MD  Active   HYDROcodone -acetaminophen  (NORCO/VICODIN) 5-325 MG tablet [id number]  Take 1 tablet by mouth every 6 (six) hours as needed for moderate pain (pain score 4-6). Micheal Wolm ORN, MD  Active   insulin  glargine, 2 Unit Dial, (TOUJEO  MAX SOLOSTAR) 300 UNIT/ML Solostar Pen [id number]  INJECT 20 UNITS SUBCUTANEOUSLY ONCE DAILY AT BEDTIME  Patient taking differently: INJECT 40 UNITS SUBCUTANEOUSLY ONCE DAILY AT BEDTIME   Micheal Wolm ORN, MD  Active            Med Note JANEAN JON DEL   Mon Dec 05, 2023  2:34 PM) 42 units in the morning  JARDIANCE  10  MG TABS tablet [id number]  TAKE ONE TABLET BY MOUTH DAILY Burchette, Wolm ORN, MD  Active   metFORMIN  (GLUCOPHAGE ) 500 MG tablet [id number]  TAKE 2 TABLETS BY MOUTH TWICE DAILY WITH FOOD Burchette, Wolm ORN, MD  Active   rosuvastatin  (CRESTOR ) 20 MG tablet [id number]  TAKE 1 TABLET BY MOUTH AT BEDTIME Burchette, Wolm ORN, MD  Active               Assessment/Plan:   Diabetes: - Currently uncontrolled - Reviewed long term cardiovascular and renal outcomes of uncontrolled blood sugar - Reviewed goal A1c, goal fasting,  and goal 2 hour post prandial glucose - Reviewed dietary modifications including low carb diet -Continue current medication therapy from list.  -Jardiance  PAP approved for 2026. Toujeo  still needs enrollment. Patient would like to come by office to sign, will coordinate paperwork. -Future consideration: May need to add bolus insulin  or increase tresiba if A1C/BG remain elevated - Patient denies personal or family history of multiple endocrine neoplasia type 2, medullary thyroid  cancer; personal history of pancreatitis or gallbladder disease. - Recommend to check glucose once daily, at varying times of day     Follow Up Plan: 1 month   [name], PharmD Clinical Pharmacist [phone]  "

## 2024-11-26 NOTE — Telephone Encounter (Signed)
 Filled and faxed provider portion [employer] (toujeo ) to provider office ,mail out pt portion.

## 2024-11-26 NOTE — Progress Notes (Signed)
" ° °  11/26/2024  Patient ID: Mary Caldwell, female   DOB: 18-Aug-1946, 79 y.o.   MRN: [id number]  Attempted to contact patient for scheduled appointment for medication management. Left HIPAA compliant message for patient to return my call at their convenience.   [name], PharmD Clinical Pharmacist [phone]   "

## 2024-12-05 ENCOUNTER — Telehealth: Payer: Self-pay

## 2024-12-05 NOTE — Telephone Encounter (Signed)
 Received provider portion [employer](toujeo ) from provider office,waiting on provider portion.

## 2024-12-05 NOTE — Progress Notes (Signed)
" ° °  12/05/2024  Patient ID: Mary Caldwell, female   DOB: 13-Dec-1945, 79 y.o.   MRN: [id number]  Provider completed their portion of Toujeo  PAP through [employer]. Faxed into [facility] for processing.   Mary Caldwell, PharmD Clinical Pharmacist [phone]  "

## 2024-12-07 ENCOUNTER — Other Ambulatory Visit: Payer: Self-pay | Admitting: Family Medicine

## 2024-12-10 ENCOUNTER — Ambulatory Visit: Admitting: Family Medicine

## 2024-12-10 ENCOUNTER — Encounter: Payer: Self-pay | Admitting: Family Medicine

## 2024-12-10 ENCOUNTER — Ambulatory Visit: Payer: Self-pay | Admitting: Family Medicine

## 2024-12-10 VITALS — BP 130/60 | HR 74 | Temp 98.2°F | Wt 160.0 lb

## 2024-12-10 DIAGNOSIS — Z7984 Long term (current) use of oral hypoglycemic drugs: Secondary | ICD-10-CM

## 2024-12-10 DIAGNOSIS — I10 Essential (primary) hypertension: Secondary | ICD-10-CM | POA: Diagnosis not present

## 2024-12-10 DIAGNOSIS — E1165 Type 2 diabetes mellitus with hyperglycemia: Secondary | ICD-10-CM

## 2024-12-10 DIAGNOSIS — Z7985 Long-term (current) use of injectable non-insulin antidiabetic drugs: Secondary | ICD-10-CM

## 2024-12-10 DIAGNOSIS — Z794 Long term (current) use of insulin: Secondary | ICD-10-CM

## 2024-12-10 DIAGNOSIS — E785 Hyperlipidemia, unspecified: Secondary | ICD-10-CM

## 2024-12-10 DIAGNOSIS — E1140 Type 2 diabetes mellitus with diabetic neuropathy, unspecified: Secondary | ICD-10-CM | POA: Diagnosis not present

## 2024-12-10 DIAGNOSIS — L84 Corns and callosities: Secondary | ICD-10-CM | POA: Diagnosis not present

## 2024-12-10 LAB — POCT GLYCOSYLATED HEMOGLOBIN (HGB A1C): Hemoglobin A1C: 7.6 % — AB (ref 4.0–5.6)

## 2024-12-10 LAB — LIPID PANEL
Cholesterol: 125 mg/dL (ref 28–200)
HDL: 35.2 mg/dL — ABNORMAL LOW
LDL Cholesterol: 26 mg/dL (ref 10–99)
NonHDL: 89.66
Total CHOL/HDL Ratio: 4
Triglycerides: 319 mg/dL — ABNORMAL HIGH (ref 10.0–149.0)
VLDL: 63.8 mg/dL — ABNORMAL HIGH (ref 0.0–40.0)

## 2024-12-10 LAB — MICROALBUMIN / CREATININE URINE RATIO
Creatinine,U: 39.6 mg/dL
Microalb Creat Ratio: UNDETERMINED mg/g (ref 0.0–30.0)
Microalb, Ur: <0.7 mg/dL

## 2024-12-10 NOTE — Patient Instructions (Signed)
 A1C today 7.6%.   increase the Toujeo  to 42 units once daily.  Set up 3 month follow up.
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
## 2024-12-13 NOTE — Telephone Encounter (Signed)
 Received pt portion pap [employer] (Toujeo ) along proof of income ,ins card, faxed to [employer] with provider portion today.

## 2024-12-19 ENCOUNTER — Telehealth: Payer: Self-pay

## 2024-12-19 NOTE — Progress Notes (Signed)
" ° °  12/19/2024  Patient ID: Mary Caldwell, female   DOB: 08/30/1946, 79 y.o.   MRN: [id number]  Received notification from [employer] that patient has been APPROVED to continue to receive Toujeo  through 11/21/25.  [name], PharmD Clinical Pharmacist [phone]  "

## 2024-12-19 NOTE — Telephone Encounter (Signed)
 Per [facility] Jon pt is approved from [employer] ([employer] ) thru 11/21/2025.

## 2024-12-28 NOTE — Progress Notes (Signed)
 Mary Caldwell                                          MRN: [id number]   12/28/2024   The [facility] Quality Team Specialist reviewed this patient medical record for the purposes of chart review for care gap closure. The following were reviewed: chart review for care gap closure-controlling blood pressure.    [facility] Quality Team

## 2025-01-07 ENCOUNTER — Other Ambulatory Visit

## 2025-06-26 ENCOUNTER — Ambulatory Visit
